# Patient Record
Sex: Male | Born: 1975 | Race: Black or African American | Hispanic: No | Marital: Single
Health system: Western US, Academic
[De-identification: ages and names within clinical notes are randomized; demographics above are authoritative.]

## PROBLEM LIST (undated history)

## (undated) ENCOUNTER — Inpatient Hospital Stay: Payer: MEDICARE | Attending: Student in an Organized Health Care Education/Training Program

## (undated) DIAGNOSIS — I10 Essential (primary) hypertension: Secondary | ICD-10-CM

## (undated) DIAGNOSIS — N289 Disorder of kidney and ureter, unspecified: Secondary | ICD-10-CM

## (undated) DIAGNOSIS — K219 Gastro-esophageal reflux disease without esophagitis: Secondary | ICD-10-CM

## (undated) MED ORDER — TACROLIMUS 1 MG CAPSULE, IMMEDIATE-RELEASE: 1 | ORAL | Status: AC

---

## 2017-12-28 ENCOUNTER — Emergency Department (HOSPITAL_COMMUNITY): Payer: Self-pay

## 2017-12-28 ENCOUNTER — Emergency Department (HOSPITAL_COMMUNITY)
Admission: EM | Admit: 2017-12-28 | Discharge: 2017-12-28 | Disposition: A | Discharge status: Home / Self Care | Payer: Self-pay | Attending: Emergency Medicine | Admitting: Emergency Medicine

## 2017-12-28 ENCOUNTER — Encounter (HOSPITAL_COMMUNITY): Payer: Self-pay

## 2017-12-28 DIAGNOSIS — N189 Chronic kidney disease, unspecified: Secondary | ICD-10-CM | POA: Insufficient documentation

## 2017-12-28 DIAGNOSIS — Z79899 Other long term (current) drug therapy: Secondary | ICD-10-CM | POA: Insufficient documentation

## 2017-12-28 DIAGNOSIS — Z992 Dependence on renal dialysis: Secondary | ICD-10-CM | POA: Insufficient documentation

## 2017-12-28 DIAGNOSIS — I160 Hypertensive urgency: Secondary | ICD-10-CM | POA: Insufficient documentation

## 2017-12-28 HISTORY — DX: Essential (primary) hypertension: I10

## 2017-12-28 HISTORY — DX: Gastro-esophageal reflux disease without esophagitis: K21.9

## 2017-12-28 HISTORY — DX: Disorder of kidney and ureter, unspecified: N28.9

## 2017-12-28 LAB — COMPREHENSIVE METABOLIC PANEL
ALK PHOS: 63 U/L (ref 38–126)
ALT: 12 U/L (ref 0–44)
ANION GAP: 13 (ref 5–15)
AST: 16 U/L (ref 15–41)
Albumin: 4.4 g/dL (ref 3.5–5.0)
BILIRUBIN TOTAL: 1.6 mg/dL — AB (ref 0.3–1.2)
BUN: 20 mg/dL (ref 6–20)
CALCIUM: 10 mg/dL (ref 8.9–10.3)
CO2: 28 mmol/L (ref 22–32)
CREATININE: 9.46 mg/dL — AB (ref 0.61–1.24)
Chloride: 96 mmol/L — ABNORMAL LOW (ref 98–111)
GFR, EST AFRICAN AMERICAN: 7 mL/min — AB (ref >60)
GFR, EST NON AFRICAN AMERICAN: 6 mL/min — AB (ref >60)
Glucose, Bld: 87 mg/dL (ref 70–99)
Potassium: 3.2 mmol/L — ABNORMAL LOW (ref 3.5–5.1)
Sodium: 137 mmol/L (ref 135–145)
TOTAL PROTEIN: 8.1 g/dL (ref 6.5–8.1)

## 2017-12-28 LAB — CBC
HCT: 40.7 % (ref 39.0–52.0)
HEMOGLOBIN: 12.7 g/dL — AB (ref 13.0–17.0)
MCH: 26.8 pg (ref 26.0–34.0)
MCHC: 31.2 g/dL (ref 30.0–36.0)
MCV: 86 fL (ref 78.0–100.0)
PLATELETS: 190 10*3/uL (ref 150–400)
RBC: 4.73 MIL/uL (ref 4.22–5.81)
RDW: 12.6 % (ref 11.5–15.5)
WBC: 3.3 10*3/uL — AB (ref 4.0–10.5)

## 2017-12-28 LAB — LIPASE, BLOOD: Lipase: 66 U/L — ABNORMAL HIGH (ref 11–51)

## 2017-12-28 MED ORDER — CLONIDINE HCL 0.1 MG PO TABS
0.1000 mg | ORAL_TABLET | Freq: Once | ORAL | Status: AC
  Administered 2017-12-28: 0.1 mg via ORAL
  Filled 2017-12-28: qty 1

## 2017-12-28 MED ORDER — ONDANSETRON 4 MG PO TBDP
4.0000 mg | ORAL_TABLET | Freq: Once | ORAL | Status: AC | PRN
  Administered 2017-12-28: 4 mg via ORAL
  Filled 2017-12-28: qty 1

## 2017-12-28 NOTE — ED Provider Notes (Signed)
[facility] Provider Note   CSN: [id number] Arrival date & time: 12/28/17  1416     History   Chief Complaint Chief Complaint  Patient presents with  . Hypertension    HPI Andrew Mccullough is a 42 y.o. male.  HPI   Patient presents today with elevated blood pressure.  He [place] Tuesday Thursday [place], he is here in town visiting family and had made arrangements to receive dialysis at the [facility].  He notes he did miss his Thursday session while traveling.  Also, yesterday ate pizza and smoked a cigarette which he knows he is not supposed to do and thinks this raised his blood pressure.  He states he received a full session of dialysis today reporting removing 4.5L.  He says he did take his clonidine and minoxidil this morning.  He has a printed sheet of his home medicines from his previous dialysis center which includes [facility], but states he did not take this today.  Of note, he is blind but states he definitely did not make medicines.  Additionally, did have a headache prior to arrival but was given Tylenol and now no longer reports this.  Past Medical History:  Diagnosis Date  . GERD (gastroesophageal reflux disease)   . Hypertension   . Renal disorder     There are no active problems to display for this patient.   History reviewed. No pertinent surgical history.      Home Medications    Prior to Admission medications   Medication Sig Start Date End Date Taking? Authorizing Provider  cloNIDine (CATAPRES) 0.1 MG tablet Take 0.1 mg by mouth 2 (two) times daily.   Yes [provider]  gabapentin (NEURONTIN) 300 MG capsule Take 300 mg by mouth every morning.   Yes [provider]  minoxidil (LONITEN) 10 MG tablet Take 10 mg by mouth daily.   Yes [provider]  sucroferric oxyhydroxide (VELPHORO) 500 MG chewable tablet Chew 1,500 mg by mouth 3  (three) times daily with meals.   Yes [provider]    Family History No family history on file.  Social History Social History   Tobacco Use  . Smoking status: Not on file  Substance Use Topics  . Alcohol use: Not on file  . Drug use: Not on file     Allergies   Asa [aspirin]   Review of Systems Review of Systems  Constitutional: Negative for activity change and appetite change.  Respiratory: Negative for chest tightness.   Cardiovascular: Negative for chest pain.  Gastrointestinal: Positive for nausea.  Neurological: Positive for headaches.  All other systems reviewed and are negative.    Physical Exam Updated Vital Signs BP 140/75   Pulse (!) 54   Temp 97.9 F (36.6 C) (Oral)   Resp 14   SpO2 99%   Physical Exam  Constitutional: He is oriented to person, place, and time. He appears well-developed and well-nourished. No distress.  HENT:  Head: Normocephalic and atraumatic.  Nose: Nose normal.  Eyes: Conjunctivae and EOM are normal.  Neck: Normal range of motion.  Cardiovascular: Normal rate, regular rhythm and normal heart sounds.  No murmur heard. Pulmonary/Chest: Effort normal and breath sounds normal. He has no rales.  Abdominal: Soft. Bowel sounds are normal.  Musculoskeletal: Normal range of motion. He exhibits no edema.  Neurological: He is alert and oriented to person, place, and time. No cranial nerve deficit.  Skin: Skin is warm and dry. Capillary refill takes less than 2 seconds.  Psychiatric: He has a normal mood and affect.     ED Treatments / Results  Labs (all labs ordered are listed, but only abnormal results are displayed) Labs Reviewed  LIPASE, BLOOD - Abnormal; Notable for the following components:      Result Value   Lipase 66 (*)    All other components within normal limits  COMPREHENSIVE METABOLIC PANEL - Abnormal; Notable for the following components:   Potassium 3.2 (*)    Chloride 96 (*)    Creatinine, Ser 9.46  (*)    Total Bilirubin 1.6 (*)    GFR calc non Af Amer 6 (*)    GFR calc Af Amer 7 (*)    All other components within normal limits  CBC - Abnormal; Notable for the following components:   WBC 3.3 (*)    Hemoglobin 12.7 (*)    All other components within normal limits    EKG EKG Interpretation  Date/Time:  Saturday December 28 2017 14:28:24 EDT Ventricular Rate:  63 PR Interval:  192 QRS Duration: 90 QT Interval:  430 QTC Calculation: 440 R Axis:   6 Text Interpretation:  Normal sinus rhythm with sinus arrhythmia Septal infarct , age undetermined T wave abnormality Baseline wander Abnormal ekg Confirmed by Gerhard Munch [id number]) on 12/28/2017 4:51:49 PM   Radiology Dg Chest 1 View  Result Date: 12/28/2017 CLINICAL DATA:  Pt presents for hypertension after dialysis today. Pt had full treatment, was given clonidine for BP at dialysis. EXAM: CHEST  1 VIEW COMPARISON:  None. FINDINGS: The heart size is enlarged but also accentuated by the portable AP technique. There are no focal consolidations or pleural effusions. No pulmonary edema. A vascular stent graft is partially imaged in the region of the LEFT axilla. IMPRESSION: Cardiomegaly.  No evidence for acute pulmonary abnormality. Electronically Signed   By: Norva Pavlov M.D.   On: 12/28/2017 16:49    Procedures Procedures (including critical care time)  Medications Ordered in ED Medications  ondansetron (ZOFRAN-ODT) disintegrating tablet 4 mg (4 mg Oral Given 12/28/17 1455)  cloNIDine (CATAPRES) tablet 0.1 mg (0.1 mg Oral Given 12/28/17 1641)     Initial Impression / Assessment and Plan / ED Course  I have reviewed the triage vital signs and the nursing notes.  Pertinent labs & imaging results that were available during my care of the patient were reviewed by me and considered in my medical decision making (see chart for details).     Patient with elevated blood pressure and headache prior to arrival, making him a  hypertensive urgency.  This may be related to salt intake indiscretion or missing his Thursday dialysis session.  Medically, appears relatively dry suggesting that he did get a full session prior to arrival.  Will get chest x-ray to assess fluid status.  Will dose additional 0.1 mg clonidine now and plan for discharge of chest x-ray is unremarkable and blood pressure continues to respond to treatment.  Chest x-ray without excess fluid, blood pressure responded nicely to additional dose of clonidine.  Will discharge patient home with continued plan to dialyze again on Tuesday at [facility], and recommended that he continue to watch his salt intake while he is here.  Final Clinical Impressions(s) / ED Diagnoses   Final diagnoses:  Hypertensive urgency    ED Discharge Orders    None        Garth Bigness, MD 12/28/17  1746    Gerhard Munch, MD 12/30/17 0010

## 2017-12-28 NOTE — ED Triage Notes (Addendum)
Pt presents for hypertension after dialysis today. Pt had full treatment, was given clonidine for BP at dialysis. Pt reports he feels nauseous and weak and has headache.

## 2017-12-28 NOTE — ED Notes (Signed)
ED Provider at bedside. 

## 2017-12-28 NOTE — Discharge Instructions (Addendum)
Your blood pressure responded well to an extra dose of your clonidine medicine.  It was likely elevated due to the extra salt that you ate yesterday.  Please return if you have any concerns with your blood pressure or feeling unwell or headache returning.  Follow-up with your plan to get dialysis [place] [facility].  Take your blood pressure medicines as normal tomorrow.

## 2018-03-03 ENCOUNTER — Encounter: Payer: Self-pay | Admitting: Nurse Practitioner

## 2018-03-03 NOTE — Progress Notes (Signed)
Received New referral from Dr Sharrell KuSu Steve W office on 03/03/2018, Insurance  Medicare and [employer].  Uploaded referral   Electronically entered by: Chanda BusingPartida, Gloria on 03/03/2018 2:39:00 PM

## 2018-03-17 NOTE — Progress Notes (Signed)
1st call : called patient at [phone] spoke to patient, completed  intake and scheduled for 07/15/2018 @ 1130am.  Electronically entered by: Chanda Busing on 03/17/2018 2:23:00 PM

## 2018-06-19 NOTE — Progress Notes (Signed)
Mailed out Patient Packet for apt on 07/15/2018 @ 11:30am in [facility]  Electronically entered by: Chanda Busing on 06/19/2018 11:12:00 AM

## 2018-07-03 NOTE — Progress Notes (Signed)
***   Outreach Eval Records:     Current Lab Results - uploaded   Nephrology Notes -uploaded   History & Physical -uploaded   Medication List -uploaded   PPD - TB Skin Test - n/a   Radiology (Chest x-ray and Abdominal ultrasound, etc.) -uploaded   Cardiac Stress Test (Echo, Stress test/myocardial perfusion, Cardiac  Catheterization and/or EKG) -uploaded  Consult notes (Cardiology, Oncology, etc.) -uploaded     Pathology Reports - n/a   Listing GFR - uploaded   2728 Form w/ Dialysis Start Date -uploaded   Social work assessments (report) -uploaded   Nutritional consultations (dietician report) -uploaded   Rounding Report - uploaded   Snapshot -  n/a   Mammogram - n/a   Pap Smear - n/a   CT/Ab Pelvis n/a     Other:       Electronically entered by: Chanda Busing on 07/03/2018 10:40:00 AM

## 2018-07-08 NOTE — Progress Notes (Signed)
RE: [facility] listed - cancel appt  Perciana RN from [facility] # [phone] returned my call, patient is not  Listed with [facility] since 07/2017 due to BMI and non compliance. Patient  would need to be re-referred to be evaluated once BMI at 35.  Patient was listed back in 2011 and removed in 2019.   Electronically entered by: Chanda Busing on 07/08/2018 2:21:00 PM

## 2018-07-08 NOTE — Progress Notes (Signed)
Call to patient on ??wait listed elsewhere??  Called patient due to notes in [facility] that he has been referred to [facility] and  [facility].  He states he has been with [facility] for many years but last year  was told he was ??off list?? due to missed testing.  Notified him that [facility] and [facility] share region and no benefit of being  listed on both lists.  He will call [facility] then get back to this writer  on weather he will keep 07/15/2018 appt. ky  Electronically entered by: Wendy Poet on 07/08/2018 11:20:00 AM

## 2018-07-08 NOTE — Progress Notes (Signed)
RE: Call to patient on ??wait listed elsewhere??  Patient was removed from [facility] schedule for 07/15/2018   Electronically entered by: Chanda Busing on 07/08/2018 1:02:00 PM

## 2018-07-08 NOTE — Progress Notes (Signed)
[facility] listed - cancel appt  Patient is inactive on [facility]'s wait list due to BMI - he states he is  losing weight but taking time due to depression.  He would like to stay  with [facility] and cancel [facility] appt for 07/15/2018. ky  Electronically entered by: Wendy Poet on 07/08/2018 11:39:00 AM

## 2018-07-14 NOTE — Progress Notes (Signed)
RE: RE: [facility] listed - cancel appt  patient called writer back, he is going to speak to his son and decide what  is the best option. Patient will then give writer a call back at  [phone] Ext [phone].  Electronically entered by: Chanda Busing on 07/14/2018 9:46:00 AM

## 2018-08-07 NOTE — Progress Notes (Signed)
RE: RE: RE: [facility] listed - cancel appt  Patient called back after seeing Dr Sharrell Ku. on 07/31/2018, per patient  Dr Janeece Riggers advise patient to just start fresh with [place]. Reviewed Intake and  scheduled patient for Evaluation in [facility] 10/14/2018 @  2:00pm   Electronically entered by: Chanda Busing on 08/07/2018 11:50:00 AM

## 2018-08-27 ENCOUNTER — Telehealth: Payer: Self-pay | Admitting: Internal Medicine

## 2018-08-27 DIAGNOSIS — N186 End stage renal disease: Principal | ICD-10-CM

## 2018-08-27 NOTE — Telephone Encounter (Signed)
Kidney transplant referral received and screened in for education class by this transplant coordinator.  Screening for the following criteria was completed based upon information sent in referral:     Age less than 43 years old   BMI less than 40   No active cigarette smoking   No physical deconditioning requiring the use of a wheelchair, walker or scooter   No advanced lung disease requiring home oxygen use   No non-compliance with dialysis within the last 6 months   No non-healing foot ulcer    Patient will be called to schedule into next available education class, prior to evaluation per CMS regulations.    Luella Cook BSN RN  Transplant Coordinator

## 2018-08-28 ENCOUNTER — Encounter: Payer: Self-pay | Admitting: Internal Medicine

## 2018-08-28 NOTE — Telephone Encounter (Signed)
During my Conversation with patient, I provided the following information:     1. They must bring a support person to the education appointment.  2. They must provide 48 hours-notice if canceling their appointment. If they do not call to reschedule within 14 days, the referral will be closed.  3. If they miss their appointment without calling to reschedule, their referral will be closed and  and their Dialysis Center and Nephrologist will be notified.   4. They will be eligible for re-referral 90 days after the missed appointment.  5. Packet was mailed out to correct address per patient.  6. Informed patient of possible appointment cancellation if Insurance is not contracted with [place].

## 2018-09-16 NOTE — Progress Notes (Signed)
Mailed out to patient  Packet Enclosed:   Appointment information  SRTR Data  Consent Forms  Electronically entered by: Chanda Busing on 09/16/2018 11:29:00 AM

## 2018-09-26 ENCOUNTER — Ambulatory Visit: Payer: Medicare Other

## 2018-09-26 NOTE — Progress Notes (Signed)
Telehealth Screening Call   Called patient at [phone]  [facility] to call writer back at [phone]  Ext [phone] regarding cancelled appointment.      Electronically entered by: Chanda Busing on 09/26/2018 8:46:00 AM

## 2018-10-07 NOTE — Progress Notes (Signed)
RE:   Uploaded new records, labs Nephrology PN, medication list   Electronically entered by: Chanda Busing on 10/07/2018 2:53:00 PM

## 2018-10-07 NOTE — Progress Notes (Signed)
RE: Telehealth Screening Call   Called patient at [phone]  [facility] to call writer back at [phone]   Electronically entered by: Chanda Busing on 10/07/2018 4:45:00 PM

## 2018-11-18 NOTE — Progress Notes (Addendum)
Called patient at [phone] spoke to patient, reminder him apt date and  time, all documents need to be turned in no later than Monday December 01, 2018, per patient will drop off today at [facility].    Appended Date: 11/28/2018 09:40 AM        By: Chanda Busing  ~~~~~~~~~~~~~~~~~~~~~~~~~  ~~~~~~~~~~~~~~~~~~~~~~~~~  ~~~~~~~~~~    Patient Called writer, changed contact # to [phone] and disconnected  [phone] all other remain the same, added email address and scheduled  Telehealth appointment.    12/02/2018 @ 12pm with Dr Genene Churn   12/09/2018 @ 1pm with Wendy Poet RN   Valda Lamb. for [facility]     Patient has packet mailed from previous appointment and aware needs to  complete and drop off at [facility] prior to 11/28/18.  New appointment Letter created and emailed to patient       Electronically entered by: Chanda Busing on 11/18/2018 3:32:00 PM

## 2018-11-20 MED ORDER — CLONIDINE ORAL
ORAL | Status: DC | PRN
Start: 2018-11-20 — End: 2019-05-26

## 2018-11-20 MED ORDER — BECLOMETHASONE DIPROP 40 MCG/ACTUATION HFA BREATH ACTIVATED AEROSOL
40 | RESPIRATORY_TRACT | Status: DC
Start: 2018-11-20 — End: 2019-04-14

## 2018-11-20 MED ORDER — SUCROFERRIC OXYHYDROXIDE 500 MG CHEWABLE TABLET
500 | ORAL | Status: AC
Start: 2018-11-20 — End: ?

## 2018-11-20 MED ORDER — MIDODRINE 5 MG TABLET
5 | ORAL | Status: DC
Start: 2018-11-20 — End: 2019-05-20

## 2018-11-20 MED ORDER — OXYCODONE-ACETAMINOPHEN 10 MG-325 MG TABLET
10-325 | ORAL | Status: AC | PRN
Start: 2018-11-20 — End: ?

## 2018-11-20 MED ORDER — ALBUTEROL SULFATE HFA 90 MCG/ACTUATION AEROSOL INHALER
90 | RESPIRATORY_TRACT | Status: AC | PRN
Start: 2018-11-20 — End: ?

## 2018-11-20 MED ORDER — MINOXIDIL 10 MG TABLET
10 | ORAL | Status: DC | PRN
Start: 2018-11-20 — End: 2019-05-26

## 2018-11-20 NOTE — Progress Notes (Unsigned)
Medical History     Diagnosis Date Comment Source   Asthma  Inhalers Provider   Back pain   Provider   Blood transfusion without reported diagnosis   Provider   BMI 35.0-35.9,adult  Height 6 foot even - weight 260 pounds for BMI 36.  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  Need to clarify FSGS by biopsy. Noted in evaluation with [facility] but no further documentation of FSGS by Nephrology.  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 Provider   MRSA (methicillin resistant Staphylococcus aureus)   Provider   Sleep apnea   Provider   Thromboembolism [facility])  Dialysis graft only Provider   Surgical History     Procedure Date Comment Source   AV FISTULA PLACEMENT 2016     BACK SURGERY      Bilateral Eye Surgery      CENTRAL VENOUS CATHETER 2015 Place and removed once fistula working    COLONOSCOPY 03/2018 Negative for polyps - no specimens taken    DIALYSIS FISTULA CREATION 2010 Revision 2014    TOOTH EXTRACTION 2018     Wound Vac 2015 Dehiscence anterior torso - chest    Family Medical History as of 11/20/2018     Problem Relation Age of Onset Comments   Diabetes Father     Diabetes Mother     Heart disease Father     Heart disease Mother     Hypertension Father     Hypertension Mother

## 2018-11-20 NOTE — Progress Notes (Signed)
Need Kidney biopsy  Need Kidney biopsy if available.  Per [facility] patient ESRD is FSGS. Tasked  to GP to obtain.  Electronically entered by: Wendy Poet on 11/20/2018 5:16:00 PM

## 2018-11-20 NOTE — Progress Notes (Signed)
Per medical records   43 yo male pt of Dr. Janeece Riggers.  ESRD due to HTN / FSGS on hemo at [facility]  ([facility]) with FDOD:  09/10/2008.  Writer to discuss IRD and Living donation.  Patient removed from [facility]'s waiting list due to BMI    Medical History   Asthma  Inhalers   Back pain     Blood transfusion without reported diagnosis    BMI 35.0-35.9,adult  Height 6 foot even - weight 260 pounds for BMI 36. ?   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 ? ?Need to clarify FSGS by  biopsy. ?Noted in evaluation with [facility] but no further documentation of  FSGS by Nephrology.  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  MRSA (methicillin resistant Staphylococcus aureus)     Sleep apnea     Thromboembolism (HCC)  Dialysis graft only      Surgical History   AV FISTULA PLACEMENT 2016    BACK SURGERY     Bilateral Eye Surgery     CENTRAL VENOUS CATHETER 2015 Place and removed once fistula working   COLONOSCOPY 03/2018 Negative for polyps - no specimens taken   DIALYSIS FISTULA CREATION 2010 Revision 2014   TOOTH EXTRACTION 2018    Wound Vac 2015 Dehiscence anterior torso - chest       Family Medical History as of 11/20/2018   Diabetes Father    Diabetes Mother    Heart disease Father    Heart disease Mother    Hypertension Father    Hypertension Mother        Electronically entered by: Wendy Poet on 11/20/2018 5:02:00 PM

## 2018-11-28 NOTE — Progress Notes (Signed)
RE: Need Kidney biopsy  pathology Kidney from 10/01/2007 uploaded   Electronically entered by: Seth Bake on 11/28/2018 9:34:00 AM

## 2018-12-01 NOTE — Progress Notes (Signed)
[place]   Patient came into [facility] office to drop off [employer]HQ and HIPPA forms, also  confirmed son received email for appointment and set to go. If any further  help needed patient is aware to contact writer at [phone] Ext  [phone] ask for [employer].   Electronically entered by: Seth Bake on 12/01/2018 2:32:00 PM

## 2018-12-02 ENCOUNTER — Ambulatory Visit: Admit: 2018-12-02 | Discharge: 2018-12-02 | Payer: MEDICARE | Attending: Nephrology

## 2018-12-02 DIAGNOSIS — I120 Hypertensive chronic kidney disease with stage 5 chronic kidney disease or end stage renal disease: Secondary | ICD-10-CM

## 2018-12-02 DIAGNOSIS — I10 Essential (primary) hypertension: Secondary | ICD-10-CM

## 2018-12-02 DIAGNOSIS — H548 Legal blindness, as defined in USA: Secondary | ICD-10-CM

## 2018-12-02 DIAGNOSIS — N051 Unspecified nephritic syndrome with focal and segmental glomerular lesions: Secondary | ICD-10-CM

## 2018-12-02 DIAGNOSIS — N186 End stage renal disease: Secondary | ICD-10-CM

## 2018-12-02 DIAGNOSIS — Z01818 Encounter for other preprocedural examination: Secondary | ICD-10-CM
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
## 2018-12-09 NOTE — Progress Notes (Signed)
Outreach Eval note  Per MD ok to list.  Patient has FDOD 2010 and will be top of list once  listed.    2 patient identifiers: Soc Sec# and DOB  Per patient ok to phone and leave detailed transplant message on their  answering machine.  Watched [place] Video about Kidney transplant  OK to receive blood and blood products    AMBULATORY SERVICES PATIENT EDUCATION NOTES  Patient/Family Readiness to Learn:  Accurately explains reason for visit and accurately relates medical history  Accurately describes the likely alteration in self-care routines/abilities  resulting from treatment  Able to provide names and reasons for taking current medications and  dosages  Able to follow proposed treatment plan for diet, activity and/or medication  without difficulty  LEARNING NEEDS:  None identified  BARRIERS:  None identified.Marland Kitchen   REFERRAL TO:  No Referral Required    INSTRUCTIONS PROVIDED:    Blood Kit #1 and #2 provided to the pt with instructions of how to complete  the kit. Reiterated to the patient that they would not be listed until  completion of both kits.  Consent obtained for HIV testing.    EDUCATION PAMPHLETS GIVEN AND EXPLAINED:    PHS Increase Risk Donor Consent Form for Recipients: consent form provided,  pt instructed if interested in enrolling into the program to sign the  informed consent form and return to [place].    Living Donor Website ? provided to the pt  Patient/Family Response to Teaching:    Verbalizes understanding of information/instructions given    On site selection completed (see presentation in [place]). Pt meets criteria  for:  Kidney transplant listing based on the diagnosis of CKD stage IV/V ESRD   Ok to list for transplant     Electronically entered by: Laurel Dimmer on 12/09/2018 1:35:00 PM

## 2018-12-09 NOTE — Progress Notes (Signed)
Blood Draw Kits/ consent forms  Tasked to GP to send Pre Eval Blood Draw Kits #1 and #2, IRD and Living  donor card  Electronically entered by: Laurel Dimmer on 12/09/2018 1:30:00 PM

## 2018-12-09 NOTE — Progress Notes (Signed)
Outreach Eval Note  Due to failed video connection, this planned video visit was conducted by  telephone only. I obtained consent from the patient to conduct this visit  by telephone only.  Electronically entered by: Laurel Dimmer on 12/09/2018 1:31:00 PM

## 2018-12-09 NOTE — Progress Notes (Signed)
Need Cardiology records  To GP to obtain cardiology records from Dr. Humberto Seals / [facility] [phone]. Need most current Progress note.  EKG, Echocardiogram, and/or Stress Test if available.  Electronically entered by: Laurel Dimmer on 12/09/2018 2:38:00 PM

## 2018-12-10 NOTE — Progress Notes (Signed)
RE: Blood Draw Kits/ consent forms  sent by [employer] tracking # [id number] ticket # [id number]   Electronically entered by: Seth Bake on 12/10/2018 2:59:00 PM

## 2018-12-15 ENCOUNTER — Encounter: Admit: 2018-12-15 | Discharge: 2018-12-16 | Payer: MEDICARE | Attending: Nephrology

## 2018-12-15 DIAGNOSIS — N186 End stage renal disease: Secondary | ICD-10-CM
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
## 2018-12-15 NOTE — Progress Notes (Signed)
psychosocial  SW evaluated patient by phone on 12/11/2018.  See [facility] for full  psychosocial.    The patient's sons also participated in the evaluation.  No potential donors identified at this time.  The patient was evaluated  by [facility] in the past, but his BMI was too high at that time.  He has  been exercising a lot and focusing on weight loss and his health.  CAREGIVER/SOCIAL SUPPORT:  The patient reported that his mother, Enid Derry at  [phone] or his son, Tonny Branch at [phone] would be his primary  support following the transplant able to provide assistance and  transportation.   CLINICAL IMPRESSIONS: No serious risk factors were identified other than  the patient's limited eye sight. Protective factors include patient's  family support, no apparent mental health, or substance use  issues, and an adequate level of insight and thoughtfulness around their  care.  PSYCHOSOCIAL RECOMMENDATIONS/PLAN:  No remarkable psychosocial issues were  identified that may impact the transplant outcome (low risk) and this  candidate is recommended for transplantation from a psychosocial  perspective.    Electronically entered by: Cheron Every on 12/15/2018 8:04:00 AM

## 2018-12-16 LAB — ABORH (OUTSIDE ORGANIZATION): ABORH Blood Type: AB NEG — NL

## 2018-12-17 ENCOUNTER — Encounter: Admit: 2018-12-17 | Discharge: 2018-12-18 | Payer: MEDICARE | Attending: Nephrology

## 2018-12-17 DIAGNOSIS — Z992 Dependence on renal dialysis: Secondary | ICD-10-CM

## 2018-12-17 DIAGNOSIS — N186 End stage renal disease: Secondary | ICD-10-CM

## 2018-12-17 LAB — ABO/RH: ABO/RH(D): AB NEG

## 2018-12-18 NOTE — Progress Notes (Signed)
RE: Need Cardiology records  Received PN from 10/22/2018 Dr Humberto Seals office and uploaded, called  [phone] , 3,3 LDVM to fax EKG, ECHO , Stress Test within 12 months to  [phone] call if any questions at [phone] ask for [place].    Electronically entered by: Seth Bake on 12/18/2018 11:03:00 AM

## 2018-12-23 LAB — SERUM PREPARATION & STORAGE (B

## 2018-12-23 LAB — HLA CLASS II TYPING - INTERMED

## 2018-12-23 LAB — HIV ANTIBODY AND ANTIGEN COMBI: HIV Ag/Ab Combo: NEGATIVE

## 2018-12-23 LAB — HEPATITIS B SURFACE ANTIBODY,: Hep B surf Ab, Quant: >800 m[IU]/mL

## 2018-12-23 LAB — HEPATITIS C ANTIBODY: Hep C Ab, Qual: NEGATIVE

## 2018-12-23 LAB — HLA CLASS I TYPING - INTERMEDI

## 2018-12-23 LAB — HLA AB - CLASS I SINGLE ANTIGE

## 2018-12-23 LAB — HEPATITIS B CORE ANTIBODY, TOT: Hep B Core Ab Total: NEGATIVE

## 2018-12-23 LAB — HEPATITIS B SURFACE ANTIGEN: Hep B surf Ag: NEGATIVE

## 2018-12-23 LAB — ALBUMIN, SERUM / PLASMA: Albumin, Serum / Plasma: 4.6 g/dL (ref 3.5–5.0)

## 2018-12-23 LAB — SERUM PREPARATION & STORAGE

## 2018-12-23 LAB — HLA ANTIBODY - CLASS II SINGLE

## 2018-12-23 LAB — ABORH (OUTSIDE ORGANIZATION): ABORH Blood Type: AB NEG — NL

## 2018-12-24 LAB — EPSTEIN-BARR VIRUS IGG AND IGM
Epstein-Barr Virus IgG Anti-EB: POSITIVE — AB
Epstein-Barr Virus IgM Anti-VC: NEGATIVE

## 2018-12-24 LAB — ABO/RH: ABO/RH(D): AB NEG

## 2018-12-24 LAB — TOXOPLASMA GONDII ANTIBODY, IG: Toxoplasma gondii Antibody, Ig: NEGATIVE

## 2018-12-24 LAB — SYPHILIS SCREEN BY RPR WITH RE: RPR: NONREACTIVE

## 2018-12-24 LAB — CYTOMEGALOVIRUS ANTIBODY, IGG: Cytomegalovirus antibody, IgG: POSITIVE — AB

## 2018-12-25 LAB — HLA CLASS II TYPING - INTERMED
DPA1-1: 1
DPA1-2: 2
DQA1-1: 1
DQA1-2: 3
DQB1-1: 3
DQB1-2: 5
DRB1-1: 13
DRB1-2: 15
DRB3-1: 2
DRB5-2: 1

## 2018-12-25 LAB — HLA CLASS I TYPING - INTERMEDI
A*-1: 66
A*-2: 68
B*-1: 15
B*-2: 58
Bw*-1: 6
Bw*-2: 4
C*-1: 5
C*-2: 6

## 2018-12-25 LAB — HLA TYPING REPORT

## 2018-12-26 LAB — HLA ANTIBODY - CLASS I SINGLE

## 2018-12-26 LAB — HLA ANTIBODY - CLASS II SINGLE

## 2018-12-26 LAB — HLA ANTIBODY REPORT

## 2018-12-30 NOTE — Progress Notes (Signed)
RE: LISTED  Pt has been added to the waitlist as ??Inactive??  Electronically entered by: Vidal Schwalbe on 12/30/2018 8:23:00 AM

## 2018-12-30 NOTE — Progress Notes (Signed)
UNOS Listing      Appended Date: 12/30/2018 08:06 AM        By: Laurel Dimmer  ~~~~~~~~~~~~~~~~~~~~~~~~~  ~~~~~~~~~~~~~~~~~~~~~~~~~  ~~~~~~~~~~    to data center for listing INACTIVE code 3 Candidate work up incomplete  Electronically entered by: Laurel Dimmer on 12/30/2018 8:06:00 AM

## 2019-01-01 NOTE — Progress Notes (Signed)
POC letter / FDOD: 2010 ABO AB Neg  POC letter in communication module dated 01/01/2019 tasked to GP to  distribute to pt/md/dxu.    ABO AB Neg, 74mprob/1%, cPRA 10%, Points 10.  Notified [facility] by email patient  has points 10 - will need work up now.  Inactive status.  Electronically entered by: Laurel Dimmer on 01/01/2019 11:52:00 AM

## 2019-01-01 NOTE — Progress Notes (Signed)
RE: RE: Need Cardiology records  Called [facility] [phone] spoke to medical records who indicated  patient has had EKG and Echo completed and will fax to [phone], No  Echo completed within 12 months  Electronically entered by: Seth Bake on 01/01/2019 10:01:00 AM

## 2019-01-02 NOTE — Progress Notes (Signed)
RE: POC letter / FDOD: 2010 ABO AB Neg  Letter printed, mailed to patient, faxed to unit and sent to referring MD   Electronically entered by: Seth Bake on 01/02/2019 10:07:00 AM

## 2019-01-06 NOTE — Progress Notes (Signed)
Pateint with phone issues  Received call from patient who indicated having phone issues.  If [place] is  trying to reach him please contact sons phone at [phone]  Electronically entered by: Laurel Dimmer on 01/06/2019 4:45:00 PM

## 2019-01-09 NOTE — Progress Notes (Signed)
Pt returned this writer's phone call. Reviewed time on waiting  list=10years, blood type AB, would like to expedite work up. Reviewed  required testing and the need for onsite Re-See visit in [place] once all  testing is completed. Drafted recipient work up [place]ar[place] manager. Reviewed process  with pt, had the opportunity to have all questions asked and answered,  verbalized understanding of all information provided.  Electronically entered by: Silas Sacramento on 01/09/2019 1:45:00 PM

## 2019-01-09 NOTE — Progress Notes (Signed)
Left message with pts son to return this writer's phone call. Name and  contact information given.  Electronically entered by: Silas Sacramento on 01/09/2019 11:56:00 AM

## 2019-01-12 NOTE — Progress Notes (Signed)
Faxed work up letter to Dr. Kasandra Knudsen, [facility], and sent to  patient's home address.  Electronically entered by: Cindi Carbon on 01/12/2019 4:40:00 PM

## 2019-01-13 NOTE — Telephone Encounter (Signed)
Scheduled  EDU  for 02/17/2019    During the conversation with patient, [place] provided the following information:     1. They must bring a support person to the education appointment.  2. They must provide 48 hours-notice if canceling their appointment. If they do not call to reschedule within 14 days, the referral will be closed.  3. If they miss their appointment without calling to reschedule, their referral will be closed and  and their Dialysis Center and Nephrologist will be notified.   4. They will be eligible for re-referral 90 days after the missed appointment.  5. Informed patient of possible appointment cancellation if Insurance is not contracted with [facility].

## 2019-01-13 NOTE — Telephone Encounter (Signed)
Updated DU resent 14 day letter

## 2019-01-13 NOTE — Progress Notes (Signed)
RE: RE: RE: Need Cardiology records  uploaded Myocardial Perfusion and EKG 11/20/2018   Electronically entered by: Seth Bake on 01/13/2019 9:34:00 AM

## 2019-01-14 NOTE — Telephone Encounter (Signed)
During my Conversation with patient, I provided the following information:     1. They must bring a support person to the education appointment.  2. They must provide 48 hours-notice if canceling their appointment. If they do not call to reschedule within 14 days, the referral will be closed.  3. If they miss their appointment without calling to reschedule, their referral will be closed and  and their Dialysis Center and Nephrologist will be notified.   4. They will be eligible for re-referral 90 days after the missed appointment.  5. Packet was mailed out to correct address per patient.  6. Informed patient of possible appointment cancellation if Insurance is not contracted with [facility].

## 2019-02-17 ENCOUNTER — Ambulatory Visit: Payer: Medicare Other

## 2019-02-19 NOTE — Progress Notes (Signed)
15 day follow up on work up letter    Uploaded PPD 05/28/18 to [facility].   Patient completed ekg and myocardial perfusion 11/20/18, results already in  [facility].  Received message from patient requesting another copy of the letter.  Returned call to patient informing him letter will be sent to patient's  home address and faxed to dialysis unit with pending items  highlighted. Patient still requires CXR, CT abd/pelvis, and echo. Patient  is unsure if he did the ECHO but he completed the stress test and 24 holter  monitor. Notified Amada Kingfisher.  Electronically entered by: Cindi Carbon on 02/19/2019 10:29:00 AM

## 2019-03-03 NOTE — Progress Notes (Signed)
PC from pt: States had MPS done last month at cardiologist office, CT and  CXR scheduled for 03/12/2019 at [facility]. Awaiting  date for TTE. Pt states will keep this Probation officer update as tests  are completed.  Electronically entered by: Silas Sacramento on 03/03/2019 5:04:00 PM

## 2019-03-12 ENCOUNTER — Ambulatory Visit: Admit: 2019-03-12 | Discharge: 2019-03-13

## 2019-03-12 NOTE — Addendum Note (Signed)
Addended by: Genevie Ann on: 03/12/2019 01:27 PM     Modules accepted: Orders

## 2019-03-12 NOTE — Telephone Encounter (Signed)
Referral closed - pt no show EDU on 02/17/2019

## 2019-03-13 NOTE — Progress Notes (Signed)
Received answering service message from patient regarding completing  studies. Returned call and patient completed CXR and CT abd/pelvis 03/12/19  at [facility] [place], echo was done at Dr. Lovie Chol  office. Will request reports.   Electronically entered by: Cindi Carbon on 03/13/2019 11:28:00 AM

## 2019-03-18 NOTE — Progress Notes (Signed)
Uploaded the following to [facility] from [facility]:    CT abd/pelvis 03/12/19  Images of CT abd/pelvis 03/12/19 now loaded in PACS.  CXR  03/12/19   ECHO 03/05/19  Holter monitor 10/22/18  Cardiology visit note 01/29/19  Patient completed testing.  Notified Amada Kingfisher.  Electronically entered by: Cindi Carbon on 03/18/2019 4:01:00 PM

## 2019-03-25 NOTE — Progress Notes (Signed)
Placed a call to patient today [phone] LVM to call the writer back for  resee appointment.  Electronically entered by: Marlana Salvage on 03/25/2019 12:42:00 PM

## 2019-04-01 NOTE — Progress Notes (Signed)
Placed a call to patient and spoke to patients son/caregiver Cody Myers  scheduled patient for a Kidney Cody Myers resee with Dr. [name] on October  27th, 2020 at 1:30 pm. Demographics were verified Full name, SS,  email address, contact number, address current Nephrologist, Emergency  contact, DXU, and DOB. Patient's son will call writer back to provide  insurance information. Writer informed patient to watch video prior  to appointment. Patient confirmed she has a Garment/textile technologist.  Electronically entered by: Marlana Salvage on 04/01/2019 11:21:00 AM

## 2019-04-03 NOTE — Progress Notes (Signed)
SW called patient's son/caregiver Daizah to schedule the patient's re-see  psychosocial evaluation telehealth appointment. Per chart patient's son  arranges appointments as patient is legally blind. Patient is  scheduled for his re-see appointment in clinic on 04/14/2019.     SW reached patient's son who reported they could participate in the  evaluation on Thursday 04/09/2019 at 2:00 pm. SW confirmed patient's son  contact information, scheduled the appointment, and sent patient's  son the link to participate.     Allyson Sabal, LCSW  Electronically entered by: Allyson Sabal on 04/03/2019 1:47:00 PM

## 2019-04-07 NOTE — Progress Notes (Addendum)
DXU NOTES and PPD uploaded    Appended Date: 04/09/2019 10:54 AM        By: Gerrianne Scale  ~~~~~~~~~~~~~~~~~~~~~~~~~  ~~~~~~~~~~~~~~~~~~~~~~~~~  ~~~~~~~~~~    04/14/19 resee packet sent to pateint  Letter faxed to MD/DXU  DXU notes requested  Electronically entered by: Gerrianne Scale on 04/07/2019 9:23:00 AM
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
## 2019-04-09 NOTE — Progress Notes (Signed)
SW evaluated patient via telephone (due to technical issues on his end) on  04/09/2019 in advance of his re-see transplant evaluation on 04/14/2019.  See [facility] for full psychosocial.    CAREGIVER/SOCIAL SUPPORT: The patient reported that his son/IHSS worker,  Vera Wishart [phone]), would be primary support following the  transplant able to provide assistance and transportation. SW spoke  with Selinda Eon who reported he already provides the main elements of patient's  car that will be required post-transplant. Daizah lives with the patient  and attends remote school. However, his school is aware of  his commitment to his father and Selinda Eon reported it has been  straightforward to get recordings of remote classes he has to miss if he  has to take his father to an appointment, and he denied concerns around  providing post-transplant care. The patient has several other family  members locally in [place] who can assist if needed.    CLINICAL IMPRESSIONS: Risk factors include patient's vision loss, mild  symptoms of depression, and relatively low functional status. Protective  factors include patient's family support, coping skills and  continuing to function through feeling down, no apparent substance use or  adherence issues, and motivation.    PSYCHOSOCIAL RECOMMENDATIONS/PLAN:     No remarkable psychosocial issues were identified that may impact the  transplant outcome (low risk) and this candidate is recommended for  transplantation from a psychosocial perspective.    1.) Patient to continue to rely on his son's support longterm  post-transplant to ensure adherence due to vision loss.  2.) Should patient's depressive symptoms increase and affect his  functioning, he will require mental health treatment.    Allyson Sabal, LCSW  Electronically entered by: Allyson Sabal on 04/09/2019 4:56:00 PM

## 2019-04-10 NOTE — Progress Notes (Signed)
Spoke with pt and confirmed that he has Medicare A,B and D and Mcal as ins  coverage and no other commercial or other ins coverage at this time. Went  over [place] thru ESRD benefits and COB. Pt understands the  importance of taking immunosuppressant drugs for life and maintaining good  ins coverage at all times. Pt states that after [place], he plans on going back  to work and obtain ins coverage thru employment. Also  advised pt to take the Rx list to his pharmacy so he can obtain a more  specific copay info per Rx. Pt is also aware of the weekly follow up care  after his tx for the first 8 weeks. Pt has his son to take him  to his apts. Pt has no other issues at this time. Pt is financially  cleared.  Electronically entered by: Elinor Dodge on 04/10/2019 4:13:00 PM

## 2019-04-10 NOTE — Progress Notes (Signed)
Emailed [place] letter and Rx list      From: Cody Myers   Sent: Friday, April 10, 2019 1:07 PM  To: [email or web address]  Subject: Secure: Cody Myers,L - [place] letter and Rx list    Hi Cody Myers,     Please find the attached insurance information regarding Kidney Cody Myers  and post-Cody Myers medications costs.   We have your current insurance as  Medicare A,B and D and Medi-cal Crossover.  Please review  the following attachments and contact me directly if you have any questions  regarding your insurance.  Your insurance does cover the Kidney Cody Myers  and post-Cody Myers medications.  Myers is always best to  verify your own coverage (copays, coinsurance, and annual out-of-pockets)  with your health plan since you are the subscriber of the plan.  Please  note that for each of your potential donors, all testing will  be billed to your own insurance, and you will be financial responsible for  all copays/coinsurance. Also, Myers is always important to make sure your  insurance coverage continues on after Cody Myers so that you  will have your post-Cody Myers care always covered.      Here is an informational link from [place] regarding COBRA and the rules:  [email or web address]  [email or web address]).aspx    ***If you are having any problems with registering or opening up the  attachment, please contact Cody Myers at [phone] Option 3 to assist you.  They are open 24 hours a day.***      TEFL teacher      Electronically entered by: Cody Myers on 04/10/2019 1:08:00 PM

## 2019-04-12 MED ORDER — PREDNISONE 5 MG TABLET
5 | ORAL | Status: DC
Start: 2019-04-12 — End: 2019-05-20

## 2019-04-12 MED ORDER — OMEPRAZOLE 40 MG CAPSULE,DELAYED RELEASE
40 | ORAL | Status: DC | PRN
Start: 2019-04-12 — End: 2019-05-21

## 2019-04-12 NOTE — Assessment & Plan Note (Signed)
Takes midodrine 10 mg on dialysis days only. No hypotension noted on dialysis flow sheet.

## 2019-04-14 ENCOUNTER — Ambulatory Visit: Admit: 2019-04-14 | Payer: MEDICARE | Attending: Physician | Primary: Physician

## 2019-04-14 DIAGNOSIS — H548 Legal blindness, as defined in USA: Secondary | ICD-10-CM

## 2019-04-14 DIAGNOSIS — Z01818 Encounter for other preprocedural examination: Secondary | ICD-10-CM

## 2019-04-14 DIAGNOSIS — I12 Hypertensive chronic kidney disease with stage 5 chronic kidney disease or end stage renal disease (CMS code): Secondary

## 2019-04-14 DIAGNOSIS — Z992 Dependence on renal dialysis: Secondary | ICD-10-CM

## 2019-04-14 DIAGNOSIS — N269 Renal sclerosis, unspecified: Secondary | ICD-10-CM

## 2019-04-14 DIAGNOSIS — N186 End stage renal disease: Secondary | ICD-10-CM

## 2019-04-14 DIAGNOSIS — N051 Unspecified nephritic syndrome with focal and segmental glomerular lesions: Secondary | ICD-10-CM

## 2019-04-14 DIAGNOSIS — I10 Essential (primary) hypertension: Secondary | ICD-10-CM

## 2019-04-14 NOTE — Progress Notes (Signed)
Medical History Summary to Date (as of 04/12/2019) MRN: [id number] SSN: [id number]   Here for Re-See appointment to clear for READY list  Name: Cody Myers  DOB: [birth year]  Age (Sex): 69M  Language: English  Ethnicity: White  Travel time: Lives in [place] - travel time to [employer] 5 hours.    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), Hypertensive Nephrosclerosis    Listing Date: 12/30/2018  Accumulated List Date: 09/10/2008  Blood Type: AB  UNOS Points: 10.6  cPRA: 10.581%  (as of 12/17/2018) 64mm (national): 1% (local): 0%  EPTS: 24%  Days since Last Serum: 116 SA Serum Past Due  Days since Last cPRA: 116    Staff Warnings:   Staff Alerts: Visually Impaired // Refuses high risk organs- Please verify this with patient  Special Programs:     FDOD: 09/10/2008  Years on Dialysis: 10  Dialysis Type: HD Hemodialysis  Schedule: M/W/F AM   Dialysis Center:  [facility]  Phone: [phone]  FAX: [phone]  Nephrologist: Drusilla Kanner, MD Phone: [phone]    Prior Transplants: 0  Notes for On-Call:     Sulfa Allergy: No known sulfa allergy  Medical Notes:   Surgical Notes: CT abd/pelvis 03/12/2019  read by AP: min very distal eia dz bil, but o/w no sig ca    Encounters: Resee (04/14/2019), VIDEO VISIT 45 (12/09/2018), VIDEO VISIT 89 (12/02/2018), Evaluation Nephrology (07/28/2008)  Latest Encounter: [facility]ee (04/14/2019) with Dr. [name]    Past Medical History:  Other Pertinent Med Hx: (include interval history since ReSee visit; include anticoagulation, on midodrine, hypotension - should not receive hypertensive kidney)  Past Surgical History: [brief summary]   Immunologic History:  (06/19/07) pRBC 2u       STUDIES (within last two years)  ------------  CT Ab/Pelvis Surgeon Comments:      CT ABDOMEN/PELVIS (03/12/2019)  CT reviewed by Dr. Adelene Amas: min very  distal eia dz bil, but o/w no sig ca  CT Abdomen and Pelvis WO Contrast 03/13/2019  [facility]  Result Impression    1.  Markedly atrophic kidneys with cystic replacement of the renal   parenchyma query polycystic kidney disease.    2.  Hepatosplenomegaly.     3.  No acute abdominal or pelvic pathology.      MYOCARDIAL PERFUSION SCAN (11/20/2018)  1. The pharmacological perfusion study is negative for ischemia.  2. There is not previous study available.  3. The global LV systolic function is normal.  4. Further evaluation with medical management is necessary at this time.  **Post-stress gated SPECT images demonstrated normal contractility with a calculated LVEF of 61%    ECHO TTE (03/05/2019)  1. Normal left ventricular size with moderate concentric left ventricular hypertrophy. EF 65%  2. grade 2 pseudo-normal diastolic LV dysfunction is present.  3. Overall left ventricular systolic function is grossly normal.  4. Mildly enlarged left atrium.  5. The aortic valve is structurally and functionally normal.  6. Mild thickened and/or calcified mitral leaflets.  7. There is trace mitral regurgitation.  8. Mild tricuspid insufficiency with normal estimated right heart pressure. RVSP 19.00 mmHg      EKG (12/23/2018)  Sinus rhythm  Consider inferior infarct    X-RAY CHEST (03/12/2019)    No acute cardiopulmonary process.  COLONOSCOPY (04/02/2018)  Melanosis in the colon.  No specimens collected.    HOLTER MONITOR (10/22/2018)  IMPRESSION:      This  24-hour  Holter  monitor  is  suggestive of  sinus  rhythm with  sinus tachycardia.      There were  PVCs and PACs. There were no  sustained  arrhythmias seen.    CT OTHER (04/02/2017)  1. No acute intracranial abnormality.  2.  Right maxillary sinus near complete opacification.  Mild left maxillary sinus mucosal thickening.   3. Mastoids are clear.     PPD (01/30/2019)  PPD negative 65mm induration    DXU NOTES (04/08/2019)  03/09/2019: Plts  262,000    Serologies: (12/17/18) CMV (POS), EBV (POS), RPR (Nonreactive), EBV IgM (NEG), HBcAb (NEG), HBsAg (NEG), HCV (NEG), HIV (NEG), Toxo IgG (NEG), HBsAb Quant (>800)    Functional Status: Limited due to visual impairment, ambulatory    Support: Son Lelan Pons will be primary caregiver     Clearances: SW (AW 04/09/19; Low Risk) FC (CI 04/10/19; Financially Cleared) RD (Not in Diag/Eval)    Selection: (12/09/2018) Presented by <<Presented_by>>; Accepted Committee: Elba Barman;  ESRD, FSGS, HTN; <<Presentation_remarks>>    Insurance: (Primary) Medicare Part A&B  (Secondary) Medicare/M-Cal Crossover     BIOPSY OTHER (01/29/2014)  FINAL DIAGNOSIS (Microscopic):  1.   Left chest, thrombus, thrombectomy -  A.   Blood clot.  B.   Portion of fibrous tissue with marked necroinflammatory  infiltrate.  C.   No evidence of malignancy.    BIOPSY LUNG (11/04/2013)  FINAL DIAGNOSIS (Gross Only):  1.   Pulmonary artery, left, removal of foreign body -  a.   Foreign body (clear plastic tube).    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

## 2019-04-14 NOTE — Assessment & Plan Note (Signed)
Diagnosed in 1990.    BP slightly high today but reports well controlled at home.  On clonidine PRN and minoxidile     Had some low BP after HD so currently using midodrine at times.

## 2019-04-14 NOTE — Progress Notes (Signed)
Name: Cody Myers  MRN: [id number]  Date of Birth: Sep 02, 1975  Date of Service: 04/14/2019      Interpreter Services? Not required.        Dear Dr. Kasandra Knudsen,       Thank you for referring patient Cody Myers to be evaluated for renal transplantation. He was re-seen in consultation on 04/14/19 at the [facility] on [place].        As you know, the patient is a 43 y.o. 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 The patient was seen today because he has been on the deceased donor waiting list since 12/30/18 with blood group AB and PRA 10.6%. he is continuing to seek kidney transplantation as a means of treatment of his CKD/ESRD.  Last seen by Dr. Grant Fontana in 12/02/2018.  Was turned down by [facility] 6 - 7 years ago but high BMI.    Been feeling well.  No recent hospitalizations or sickness (last hospitalization was beginning of the year for HTN).  Legally blind since 2009 but son helps out with daily activity.  Works out with his children almost daily (weight training and walks treadmills for 15 - 101mn).  No trouble breathing or chest pain with activity.  No issues with HD but been using midodrine at times due to hypotension after HD but not all the time.      I reviewed the patient's medical records and laboratory data provided as part of this evaluation process.     Past Medical History:  Past Medical History:   Diagnosis Date    Asthma     Inhalers    Back pain     Blood transfusion without reported diagnosis     BMI 35.0-35.9,adult     Height 6 foot even - weight 260 pounds for BMI 36.      Chronic kidney disease 2009    ESRD due FSGS / DM.    Need to clarify FSGS by biopsy.  Noted in evaluation with [facility] but no further documentation of FSGS by Nephrology.     Depression     not taking medication    GERD (gastroesophageal reflux disease)     Gout     Heart murmur     Hypercholesterolemia     Hypertension     2018 BP dropping  on dialysis - MD ordered Midodrine 5 mg for SBD <100    Legally blind 2010    Conjunctiva disorder with prolapse right eye.  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    MRSA (methicillin resistant Staphylococcus aureus)     Sleep apnea     Thromboembolism (CMS code)     Dialysis graft only       Past Surgical History:  Past Surgical History:   Procedure Laterality Date    AV FISTULA PLACEMENT Right 2016    BACK SURGERY      Bilateral Eye Surgery      CENTRAL VENOUS CATHETER  2015    Place and removed once fistula working    COLONOSCOPY  03/2018    Negative for polyps - no specimens taken    DIALYSIS FISTULA CREATION Left 2010    Revision 2014    TOOTH EXTRACTION  2018    Wound Vac  2015    Dehiscence anterior torso - chest       Family History:  Family History   Problem Relation Name Age  of Onset    Diabetes Mother      Heart disease Mother      Hypertension Mother      Diabetes Father      Heart disease Father      Hypertension Father         Social History:  Social History     Socioeconomic History    Marital status: Single     Spouse name: Not on file    Number of children: 4    Years of education: 12    Highest education level: Associate degree: occupational, Hotel manager, or vocational program   Occupational History    Occupation: Disabled Administrator   Tobacco Use    Smoking status: Former Smoker     Packs/day: 1.00     Years: 4.00     Pack years: 4.00     Types: Cigarettes     Last attempt to quit: 06/19/2007     Years since quitting: 11.8    Smokeless tobacco: Never Used   Substance and Sexual Activity    Alcohol use: Not Currently    Drug use: Not Currently    Sexual activity: Not Currently         Current Medications:  Current Outpatient Medications   Medication Sig Dispense Refill    albuterol 90 mcg/actuation metered dose inhaler Inhale 2 puffs into the lungs      beclomethasone (QVAR REDIHALER) 40 mcg/actuation breath actuated inhaler Inhale 80 mcg into the lungs 2 (two) times daily       clonidine HCl (CLONIDINE ORAL) Take 0.1 mg by mouth daily         midodrine (PROAMATINE) 5 mg tablet Take 10 mg by mouth 3 (three) times a week on Mondays, Wednesdays, and Fridays On dialysis days only.        MINOXIDIL ORAL Take 10 mg by mouth daily         omeprazole (PRILOSEC) 40 mg capsule Take 40 mg by mouth daily      oxyCODONE-acetaminophen (PERCOCET) 10-325 mg tablet Take 1 tablet by mouth every 4 (four) hours as needed for Pain      predniSONE (DELTASONE) 5 mg tablet Take 5 mg by mouth daily      sucroferric oxyhydroxide (VELPHORO ORAL) Take by mouth       No current facility-administered medications for this visit.        Allergies:  Allergies/Contraindications   Allergen Reactions    Aspirin      Other reaction(s): Other (Specify with Comments)  Bleeding ulcers  Has stomach bleeding      Lactose      Other reaction(s): Other (Specify with Comments)       Review of Systems:  Review of systems was performed and the medical history questionnaire was reviewed with the patient.     Review of Systems   Constitutional: Negative for chills and fever.   HENT: Negative for congestion and hearing loss.    Eyes: Positive for blurred vision.   Respiratory: Negative for cough and hemoptysis.    Cardiovascular: Negative for chest pain and leg swelling.   Gastrointestinal: Negative for heartburn, nausea and vomiting.   Genitourinary: Negative for dysuria.   Musculoskeletal: Negative for myalgias.   Skin: Negative for rash.   Neurological: Negative for dizziness, seizures and weakness.   Psychiatric/Behavioral: Negative for depression and suicidal ideas.       The remainder of the review of systems was negative.    Physical  Examination:  There were no vitals filed for this visit.    Physical Exam  Constitutional:       Appearance: Normal appearance.   HENT:      Head: Normocephalic and atraumatic.      Right Ear: External ear normal.      Left Ear: External ear normal.      Nose: Nose normal.       Mouth/Throat:      Mouth: Mucous membranes are moist.   Neck:      Musculoskeletal: Normal range of motion.   Cardiovascular:      Rate and Rhythm: Normal rate and regular rhythm.   Pulmonary:      Effort: Pulmonary effort is normal.      Breath sounds: Normal breath sounds.   Abdominal:      Palpations: Abdomen is soft.   Musculoskeletal: Normal range of motion.   Skin:     General: Skin is warm.   Neurological:      General: No focal deficit present.      Mental Status: He is alert.   Psychiatric:         Mood and Affect: Mood normal.         Behavior: Behavior normal.         Exercise Capacity:   Treadmills for 15-40mn without issues.      Other Studies:  I personally reviewed and interpreted outside medical records including clinic/ hospital progress notes and outside laboratory/ radiologic data including the results noted below:    BIOPSY KIDNEY (10/01/2007)  Focal Segmental Glomerulosclerosis  Patchy interstitial fibrosis and "thyroidzation type of tubular atrophy    CT ABDOMEN/PELVIS (03/12/2019)  1. Markedly atrophic kidneys with cystic replacement of the renal   parenchyma query polycystic kidney disease.  2. Hepatosplenomegaly.   3. No acute abdominal or pelvic pathology.     MYOCARDIAL PERFUSION SCAN (11/20/2018)  1. The pharmacological perfusion study is negative for ischemia.  2. There is not previous study available.  3. The global LV systolic function is normal.  4. Further evaluation with medical management is necessary at this time.  **Post-stress gated SPECT images demonstrated normal contractility with a calculated LVEF of 61%    ECHO TTE (03/05/2019)  1. Normal left ventricular size with moderate concentric left ventricular hypertrophy. EF 65%  2. grade 2 pseudo-normal diastolic LV dysfunction is present.  3. Overall left ventricular systolic function is grossly normal.  4. Mildly enlarged left atrium.  5. The aortic valve is structurally and functionally normal.  6. Mild thickened and/or  calcified mitral leaflets.  7. There is trace mitral regurgitation.  8. Mild tricuspid insufficiency with normal estimated right heart pressure. RVSP 19.00 mmHg    EKG (12/23/2018)  Sinus rhythm  Consider inferior infarct    X-RAY CHEST (03/12/2019)  No acute cardiopulmonary process.     COLONOSCOPY (04/02/2018)  Melanosis in the colon.  No specimens collected.    HOLTER MONITOR (10/22/2018)  This  24-hour  Holter  monitor  is  suggestive of  sinus  rhythm with  sinus tachycardia.      There were  PVCs and PACs. There were no  sustained  arrhythmias seen.    CT OTHER (04/02/2017)  1. No acute intracranial abnormality.  2.  Right maxillary sinus near complete opacification.  Mild left maxillary sinus mucosal thickening.   3. Mastoids are clear.     PPD (01/30/2019)  PPD negative 036minduration    DXU NOTES (  04/08/2019)  03/09/2019: Plts 262,000    Serologies: (12/17/18) CMV (POS), EBV (POS), RPR (Nonreactive), EBV IgM (NEG), HBcAb (NEG), HBsAg (NEG), HCV (NEG), HIV (NEG), Toxo IgG (NEG), HBsAb Quant (>800)       Impression/Plan:  In summary, Cody Myers is a 43 y.o.  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     Over the course of 2 hours, the patient attended an educational lecture about kidney transplantation and met the evaluation team, which consists of pre-transplant nurse coordinators and physicians. The patient will be meeting with the social worker and financial counselor via virtual visit. All practical aspects of renal transplantation were discussed at length with the patient, including the differences between living donor and deceased donor renal transplantation, issues associated with the deceased donor waiting list such as wait-times, multiple listing and parallel listing, as well as the alternatives to transplantation. I explained the in-hospital course after transplantation as well as the post-transplant follow-up and the  absolute requirement for lifelong administration of immunosuppressive medications. The risks of immunosuppression in general as well as specific side effects of commonly used immunosuppressive medications were also reviewed.         Special focus in our discussion revolved around staying healthy and keeping weight down (Currenlty BMI of 36.75 which is acceptable for transplant).       The following additional tests are required as part of the pretransplant work-up:    None    The case has been reviewed. Onsite selection has been completed and patient has been approved for transplantation.      Deceased donor considerations:   Since hypotension is not a concern, hypertensive deceased donor is not a concern.        The patient was informed that if his medical condition changes or he does not pass the additional medical/surgical testing protocol, transplantation will not be an option for the treatment of his kidney disease. I believe the patient has a good understanding of the risks and benefits associated with renal transplantation.     I spent a total of 60 minutes face-to-face with the patient and 45 minutes of that time was spent counseling regarding the risks and benefits of transplantation, national and center specific graft and patient outcomes, and the factors affecting success of transplantation.      Thank you for referring Mr. Iosefa Weintraub to our Transplant Service. We will keep you apprised of his progress. If you have any questions of problems, please do not hesitate to contact me.     Case discussed with Dr. Chauncy Passy.

## 2019-04-14 NOTE — Assessment & Plan Note (Signed)
Gets dialysis [facility] via [facility]

## 2019-04-14 NOTE — Assessment & Plan Note (Signed)
Cause of her ESRD along with HTN.

## 2019-04-14 NOTE — Assessment & Plan Note (Signed)
Seen patient for transplant evaluation.      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  Been on dialysis since 2010.    Appears to be good candidate medically.

## 2019-04-14 NOTE — Assessment & Plan Note (Signed)
Son helps with daily activity

## 2019-04-15 NOTE — Progress Notes (Signed)
Pt arrived for kidney tx re-see and watched Keilah Lemire Education Video on  04/14/19. Was seen by Dr. [name]. Pt received SRTR (Version Spring 2020).    Electronically entered by: Gerrianne Scale on 04/15/2019 7:34:00 AM

## 2019-04-21 NOTE — Progress Notes (Signed)
[facility] NUTRITION:  Pre Kidney Transplant        [facility]         Nutrition Assessment:  Cody Myers is a 43 y.o. yo male present in Re-See Clinic pre-renal transplant for initial medical nutrition therapy (MNT) for post kidney transplant recovery, long term kidney health and lifelong disease prevention.    Nutrition-related Medical Hx:  ESRD, FSGS, HTN, HLD    Nutrition-related Medication:  anti-HTN, anti-lipid, phosphate binder    Anthropometrics:    Ht Readings from Last 1 Encounters:   04/21/19 179.1 cm (5' 10.5")   :    Wt Readings from Last 1 Encounters:   04/21/19 (!) 117.5 kg (259 lb 2.1 oz)     139 % IBW (169# +/-10%)  Body mass index is 36.66 kg/m.   Class II obesity      Biochemical Data, Medical Tests and Procedures:     No current lab values available.       Current Diet:  Renal for dialysis  Appetite:  good  Diet History:  B- egg: L- tuna salad or leftover; D- protein or soup, vegetables cooked with no salt by son; sn- granola bar  popsickle.  Drinks water  Assessment:  low - moderatesodium ( some processed); moderate -excess total/saturated fat (animal products,added); moderate-excess total/refined carbohydrates (starches);  meeting protein needs; inadequate fiber/plant foods  Energy:  good  Activity:  walk / day recently initiated with son        Nutrition Diagnosis:  Altered nutrition-related laboratory value related to HTN as evidenced by ESRD.      Nutrition Intervention:  Nutrition Education:    [x]   Pre renal transplant MNT recommended for:        1.   ESRD with dialysis RD        2.   Weight Management - regular walking, less starches, fats    [x]   Post renal transplant nutrition briefly reviewed with written information provided to include:         1.  Early post transplant MNT for healthy recovery and reduced infection risk.         2.  Long-term MNT for healthy kidney maintenance and prevention of disease risks of heart disease, obesity, diabetes.         3.   Healthy Plate method and Good Nutrition for Post Kidney Transplant.     4.  Regular exercise: Goal is 150 minutes each week or more, as tolerated.     [x]   Nutrition information on post renal transplant was mailed/emailed to patient       [x]   Patient will be seen by [facility] inpatient RD for continued MNT for post renal transplant.        Nutrition Evaluation & Monitoring:  [x]   Utilize dialysis RD for MNT pre transplant surgery to maintain nutritional status, manage weight, diabetes  [x]   [facility] inpatient RD will complete MNT for post kidney transplant prior to hospital discharge.    [x]   [facility] post renal transplant RD is available in [facility]   [x]   [facility] available with referral for MNT for diabetes and weight management.          Leone Haven, RD, CDE  Outpatient Senior Dietitian  [facility] Pre-Kidney Transplant Dietitian

## 2019-05-08 NOTE — Progress Notes (Signed)
Dear [facility],  Please change this patients status to ACTIVE in [facility]. Thank you, Manuela Schwartz  Electronically entered by: Silas Sacramento on 05/08/2019 6:16:00 PM

## 2019-05-08 NOTE — Progress Notes (Signed)
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  ?  Weight: 259 lbs (117.5 kg) Height: 6'0?? BMI: 35 (as of Dec 09, 2018)  Guideline Max Weight:  295 lbs (BMI of 40)   ?  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  Listing Date: 12/30/2018  Accumulated List Date: 09/10/2008  Blood Type: AB  UNOS Points: 10.6  cPRA: 10.581%  (as of 12/17/2018) 49mm  (national): 1% (local): 0%  EPTS: 24%  Days since Last Serum: 116 SA Serum Past Due  Days since Last cPRA: 116  ?  Staff Warnings:   Staff Alerts: Visually Impaired // Refuses high risk organs- Please verify  this with patient  Special Programs:   ?  FDOD: 09/10/2008  Years on Dialysis: 10  Dialysis Type: HD Hemodialysis  Schedule: M/W/F AM   Dialysis Center:  [facility]  Phone:  [phone]  FAX: [phone]  Nephrologist: Marvis Moeller, MD Phone: [phone]  ?  Prior Transplants: 0  Notes for On-Call:  TAKES MIDODRINE 10MG  ON DIALYSIS DAYS. NO HYPOTENSIVE  EPISODES NOTED ON MOST RECENT DXU NOTES.  ?  Sulfa Allergy: No known sulfa allergy  Medical Notes:   Surgical Notes: CT abd/pelvis 03/12/2019  read by AP: min very distal eia  dz bil, but o/w no sig ca  ?  Encounters: Resee (04/14/2019),   Latest Encounter: Resee (04/14/2019) with Dr. 04/16/2019  ?  Past Medical History: asthma, back pain, FSGS, hx depression, GERD, gout,  heart murmur, hypercholestrerolemia, HTN, Legally blind, sellp apnea,  Other Pertinent Med Hx:  Past Surgical History: AVF creation, back surgery,   Immunologic History:  (06/19/07) pRBC 2u   ?  ?  STUDIES (within last two years)  ------------  CT Ab/Pelvis Surgeon Comments:    ?  CT ABDOMEN/PELVIS  (03/12/2019)  CT reviewed by Dr. 03/14/2019: min very distal eia dz bil, but o/w no sig ca  CT Abdomen and Pelvis WO Contrast 03/13/2019  [facility]  Result Impression  ?  1. ?Markedly atrophic kidneys with cystic replacement of the renal   parenchyma query polycystic kidney disease.  ?  2. ?Hepatosplenomegaly.   ?  3. ?No acute abdominal or pelvic pathology. ?    MYOCARDIAL PERFUSION SCAN (11/20/2018)  1. The pharmacological perfusion study is negative for ischemia.  2. There is not previous study available.  3. The global LV systolic function is normal.  4. Further evaluation with medical management is necessary at this time.  **Post-stress gated SPECT images demonstrated normal contractility with a  calculated LVEF of 61%    ECHO TTE (03/05/2019)  1. Normal left ventricular size with moderate concentric left ventricular  hypertrophy. EF 65%  2. grade 2 pseudo-normal diastolic LV dysfunction is present.  3. Overall left ventricular systolic function is grossly normal.  4. Mildly enlarged left atrium.  5. The aortic valve is structurally and functionally normal.  6. Mild thickened and/or calcified mitral leaflets.  7. There is trace mitral regurgitation.  8. Mild tricuspid insufficiency with normal estimated right heart pressure.  RVSP 19.00 mmHg      EKG (12/23/2018)  Sinus rhythm  Consider inferior infarct    X-RAY CHEST (03/12/2019)  ?  No acute cardiopulmonary process. ?    COLONOSCOPY (04/02/2018)  Melanosis in the colon.  No specimens collected.    HOLTER MONITOR (10/22/2018)  IMPRESSION:      This  24-hour  Holter  monitor  is  suggestive of  sinus  rhythm with  sinus tachycardia.  There were  PVCs and PACs. There were no  sustained  arrhythmias seen.  ?  CT OTHER (04/02/2017)  1. No acute intracranial abnormality.  2.  Right maxillary sinus near complete opacification.  Mild left maxillary  sinus mucosal thickening.  3. Mastoids are clear.     PPD (01/30/2019)  PPD negative 73mm induration    DXU  NOTES (04/08/2019)  03/09/2019: Plts 262,000    Serologies: (12/17/18) CMV (POS), EBV (POS), RPR (Nonreactive), EBV IgM  (NEG), HBcAb (NEG), HBsAg (NEG), HCV (NEG), HIV (NEG), Toxo IgG (NEG),  HBsAb Quant (>800)  ?  Functional Status: Limited due to visual impairment, ambulatory  ?  Support: Son Selinda Eon will be primary caregiver   ?  Clearances: SW (AW 04/09/19  Low Risk) FC (CI 04/10/19  Financially Cleared) RD 04/21/2019  ?  Selection: (12/09/2018) on-site selection: Dr. Chauncy Passy, Dr. Robyn Haber,  A.Watts  Insurance: (Primary) Medicare Part A&B  (Secondary) Medicare/M-Cal  Crossover    BIOPSY OTHER (01/29/2014)  FINAL DIAGNOSIS (Microscopic):  1. ? Left chest, thrombus, thrombectomy -  A. ? Blood clot.  B. ? Portion of fibrous tissue with marked necroinflammatory  infiltrate.  C. ? No evidence of malignancy.    BIOPSY LUNG (11/04/2013)  FINAL DIAGNOSIS (Gross Only):  1. ? Pulmonary artery, left, removal of foreign body -  a. ? Foreign body (clear plastic tube).    BIOPSY KIDNEY (10/01/2007)  Focal Segmental Glomerulosclerosis  Patchy interstitial fibrosis and ??thyroidzation type of tubular atrophy  ?            Electronically entered by: Silas Sacramento on 05/08/2019 6:15:00 PM

## 2019-05-08 NOTE — Progress Notes (Signed)
Spoke to patient. Reviewed his status will be changed to ??Ready??. This  means he can received an offer and any time. Verified and updated contact  infor. Reviewed estimated LOS, need for 2x/week post op clinic  visits first 8 weeks. Need for life-long IS. Pt is visually impaired and  request his son be allowed to stay/help meet his care needs while in the  hospital. Pt knows to contact us if any change to overall  clinical condition, demographics, insurance, requires blood transfusion,  develops infection tx with ABX. Has this writers name and contact info.  Electronically entered by: Silas Sacramento on 05/08/2019 6:29:00 PM

## 2019-05-11 NOTE — Progress Notes (Signed)
RE: Active  Updated status to active.  Electronically entered by: Berlinda Last on 05/11/2019 9:31:00 AM

## 2019-05-11 NOTE — Progress Notes (Signed)
Faxed clearance letter to Dr. Kasandra Knudsen, [facility] [place] Maker, and sent to patient's home  address.  Electronically entered by: Cindi Carbon on 05/11/2019 6:18:00 PM

## 2019-05-16 DIAGNOSIS — N186 End stage renal disease: Secondary | ICD-10-CM

## 2019-05-16 DIAGNOSIS — Z538 Procedure and treatment not carried out for other reasons: Secondary | ICD-10-CM

## 2019-05-16 DIAGNOSIS — Z87891 Personal history of nicotine dependence: Secondary | ICD-10-CM

## 2019-05-16 DIAGNOSIS — Z20828 Contact with and (suspected) exposure to other viral communicable diseases: Secondary | ICD-10-CM

## 2019-05-16 DIAGNOSIS — H548 Legal blindness, as defined in USA: Secondary | ICD-10-CM

## 2019-05-16 DIAGNOSIS — M109 Gout, unspecified: Secondary | ICD-10-CM

## 2019-05-16 DIAGNOSIS — I120 Hypertensive chronic kidney disease with stage 5 chronic kidney disease or end stage renal disease: Secondary | ICD-10-CM

## 2019-05-16 DIAGNOSIS — M549 Dorsalgia, unspecified: Secondary | ICD-10-CM

## 2019-05-16 DIAGNOSIS — J45909 Unspecified asthma, uncomplicated: Secondary | ICD-10-CM

## 2019-05-16 DIAGNOSIS — N051 Unspecified nephritic syndrome with focal and segmental glomerular lesions: Secondary | ICD-10-CM

## 2019-05-16 DIAGNOSIS — E1122 Type 2 diabetes mellitus with diabetic chronic kidney disease: Secondary | ICD-10-CM

## 2019-05-16 DIAGNOSIS — Z992 Dependence on renal dialysis: Secondary | ICD-10-CM

## 2019-05-16 DIAGNOSIS — K219 Gastro-esophageal reflux disease without esophagitis: Secondary | ICD-10-CM

## 2019-05-16 DIAGNOSIS — Z01810 Encounter for preprocedural cardiovascular examination: Secondary | ICD-10-CM

## 2019-05-16 MED ORDER — ONDANSETRON HCL 4 MG TABLET: 4 mg | ORAL | Status: DC | PRN

## 2019-05-16 MED ORDER — METHYLPREDNISOLONE SODIUM SUCCINATE 500 MG INTRAVENOUS SOLUTION: 62.5 mg/mL | INTRAVENOUS | Status: DC

## 2019-05-16 MED ORDER — ONDANSETRON HCL (PF) 4 MG/2 ML INJECTION SOLUTION: 4 mg/2 mL | INTRAMUSCULAR | Status: DC | PRN

## 2019-05-16 MED ORDER — SODIUM CHLORIDE 0.9 % (FLUSH) INJECTION SYRINGE: 0.9 % | INTRAMUSCULAR | Status: DC

## 2019-05-16 MED ORDER — SODIUM CHLORIDE 0.9 % (FLUSH) INJECTION SYRINGE: 0.9 % | INTRAMUSCULAR | Status: DC | PRN

## 2019-05-16 NOTE — Progress Notes (Signed)
PT admitted for primary DDRT offer, offer declined due to biopsy results  after pt was admitted.  TYPE DONOR:  SCD  UNOS:  [id number]  [place]:  [id number]    Since Last Social Work Eval:  1. Name of adult primary caregiver who will help at home following  Mehr Depaoli: and is this person going to drive you to and from [place]  for the next 6 weeks? Son  2. If different from above, who is going to take care of you and drive you  to and from [place] for the next 6 weeks?  Same as above  3. Name of insurance and source of income:  Medicare A & B and Medicare /  M-Cal Crossover  4. Any inpatient psychiatric hospitalizations in the last 2 years?   NO   5. Has patient been drug/alcohol/substance/smoking free?  Yes   6. The risks and benefits of transplantation, the need for strict  medication regimen including taking anti-rejection medications the life of  the [place] and need to come to our clinic in [place] 1-2  times per week for the next 8 weeks (with decreasing frequency of  appointments thereafter) were all reviewed previously.  Answered patient?s  questions regarding logistics of Deyani Hegarty and admission process.    COVID-19 screening questions:    1. In the past 14 days, have you experienced any of the following new,  unusual or worsening symptoms? NO  (If yes to any of below questions, review with oncall surgeon PRIOR to  admitting patient)    *Fever, chills (temp >37.8/100 F)  *Cough  *Trouble breathing  *Sinus congestion or Runny nose  *Sore throat  *Nausea/vomiting  *Diarrhea  *Unexplained muscle aches  *Loss of taste or smell  *Eye redness or discharge    (unusual=not explained by a pre-existing condition)    2. In the last 30 days, have you been diagnosed with COVID-19? NO   (diagnosed = diagnosis or confirmed per a physician/provider)    3. In the last 14 days, have you had unprotected close contact with someone  diagnosed with COVID-19 (such as a household contact) NO    (unprotected =without  face covering or without appropriate PPE at work if a  Scientist, research (physical sciences))    (close contact = w/in 6 feet for 15 minutes or longer, live with a person  diagnosed with  COVID-19)    PT REMINDED OF VISITOR POLICY: YES   Visiting Hours ? 10 AM to 8 PM   NO visitor can stay the night   *If situation warrants need of family member to stay the night ie  developmentally delayed etc,  then Tx Coord will need to get approval from admitting supervisor.   1 visitor per day (can not swap visitors in any one given day).   ALL visitors must wear masks at [place] including in patient rooms    GENERAL questions:  Has patient been hospitalized, had any procedures or illness since our last  consultation with them?  Will patient accept blood transfusion if needed intra or postoperatively?   Does patient have a SULFA allergy?   Yes   No    (Check Care Everywhere if  time allows.  If no G6PD done request on admission)  Are serologies needed on admission?      Does the patient use CPAP?  No     If yes, please have him/her bring their CPAP machine to the hospital.  What is number of previous Noelle Sease(s)?  0  Most current cRPA/Date:11% / 12-17-2018  Was the last antibody test done within last 3 months?  No  If above No, did Dr. Warnell Forester approve the use of >56 month old serum?  No  Has patient received a blood transfusion since last antibody test?  No  Did pt have any infection that required antibiotics since last antibody  test?  Yes  If applicable, any pregnancy since the last antibody test?  N/A    Admissions, KTU, OR, nephrologist, resident, research coordinator and  patient?s nephrologist all informed of patients admission. Yitzel Shasteen  Notification form sent.    FINAL XMATCH:      Only single specimen (drawn on 12/17/2018) has been tested for HLA antibodies  for this candidate. This serum does not have donor-specific antibodies  (DSA) against this donor. Therefore, the VXM is predicted to  be negative and DSA negative. However,  per our policy, we must to test  another serum sample to confirm the results of the first serum. Therefore,  I recommend a Flow Crossmatch on patient?s admit serum. Please  call the [facility] on-call team for Flow crossmatch, and send [place] one red top  blood (for serum) from patient on admit. We will report the flow crossmatch  as soon as the flow crossmatch results become available.    Please call me if you have questions. Thank you.    Verline Lema, Ph.D., D(ABHI)  Professor of Clinical Surgery, Department of Surgery  Director, Immunogenetics and Transplantation Laboratory  [place] [facility] [place], [place]    On Engineer, technical sales and [employer] notified of x-match results.                          Electronically entered by: Wilber Oliphant on 05/16/2019 12:03:00 AM

## 2019-05-16 NOTE — Other (Signed)
URGENT ADMIT    Patient: Cody Myers  MRN: [id number]  [place]: [id number]    Received call from [facility] Transplant Coordinator at 18:40, contact # [phone]  Patient will be coming from Home, ETA - is en route, ETA 21:00    Dx: ESRD  Tx: Kidney Transplant    Service:  KTU  ELOS (days): 5-7    Nursing unit: 9L  Ok to board: Yes  Tele/CPO/Room precautions or needs: none LEGALLY BLIND; SON WILL STAY WITH PATIENT. NEEDS PRIVATE ROOM      COVID Screening or Test:Asymptomatic, Not Tested (in past 96 hours) asymptomatic    Admitting/Attending: Paulita Fujita ([id number])    Chart review/additional notes: 43 y.o. male with ESRD secondary to FSGS and HTN (since 1990), who initiated dialysis in 09/11/18

## 2019-05-16 NOTE — Anesthesia Pre-Procedure Evaluation (Signed)
[facility]  Anesthesia Preprocedure Evaluation    Procedure Information     Case: [id number] Date/Time: 05/17/19 0600    Procedure: KIDNEY TRANSPLANT CADAVERIC (N/A Abdomen)    Diagnosis: ESRD (end stage renal disease) (CMS code) [N18.6]    Pre-op diagnosis: ESRD (end stage renal disease) (CMS code) [N18.6]    Location: [place] [facility]: [place]. Paulita Fujita, MD          Precautions          None          Relevant Problems   No relevant active problems       Previous Anesthesia Records Displaying the 5 most recent records    Date   Procedure Department Responsible Provider Complications Comp/Issues @ Handoff Comp/Issues @ Post Op    12/23/18  See report for details SURGERY [facility] -  - -    07/04/18  See report for details SURGERY [facility] -  - -    04/02/18  See report for details ENDOSCOPY [facility] -  - -    02/15/17  See report for details SURGERY [place] -  - -    07/28/14  See report for details SURGERY [facility] all records in Chart Review            Anesthesia Encounter History        CC/HPI/Past Medical History Summary: Cody Myers is a 43 y.o. 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     PMH:  # HTN  # GERD - on omeprazole  # chronic back pain: on oxycodone 10    Etiology of renal failure: FSGS and HTN  Mode of dialysis: HD via R AVF (prior use of L AVF and graft)  Dialysis regimen: MWF  Last dialysis: 11/27 AM, initiated dialysis: 2010  Estimated native UO: none    (Please refer to APeX Allergies, Problems, Past Medical History, Past Surgical History, Social History, and Family History activities, Results for current data from these respective sections of the chart; these sections of the chart are also summarized in reports, including the Patient Summary Extracts found in Chart Review)      Summary of Outside Records:    myocardial perfusion scan 11/20/18 w/ no evidence of ischemia   TTE 03/05/19 w/ EF 65% without any additional structural abnormalities.          Review of Systems    Physical Exam    Prepare (Pre-Operative Clinic) Assessment/Plan/Narrative    Stop [name] information    Anesthesia Assessment and Plan  (See Anesthesia Record for attending attestation)    [Please note, smart link data included in this note may not reflect changes since note creation. Please see appropriate section of APeX for up-to-the minute information.]

## 2019-05-16 NOTE — H&P (Signed)
KIDNEY RECIPIENT - PREOPERATIVE H&P       History of Present Illness  43 yo man who is legally blind 2/2 severe conjunctivitis and has ESRD 2/2 FSGS (dx on biopsy in 09/2007) and HTN who began HD in 2010 and now presents for DDRT. He states that he is feeling well without any specific complaints. Of note, he does take oxycodone 10mg  as needed for pain associated with back pain. No prior abdominal surgical history.    In terms of his transplant workup, he underwent a myocardial perfusion scan on 11/20/18 that demonstrated no evidence of ischemia. TTE on 03/05/19 showed an EF 65% without any additional structural abnormalities. CT abdomen/pelvis on 03/12/19 showed mild atherosclerosis of the bilateral distal external iliac arteries and (as expected) atrophic kidneys but was otherwise unremarkable.     He underwent a single serum crossmatch evaluation in 12/2018 that did not identify any DSAs, but he does require a repeat flow crossmatch today prior to transplant.    Problem   Esrd (End Stage Renal Disease) On Dialysis (Cms Code)       Etiology of renal failure: FSGS and HTN    Mode of dialysis: HD via R AVF (prior use of L AVF and graft)  Dialysis regimen: MWF  Last dialysis: 11/27 AM, initiated dialysis: 2010    Estimated native UO: none    History of UTI: no  Difficulty voiding?: Not applicable    Past Medical History:   Diagnosis Date    Asthma     Inhalers    Back pain     Blood transfusion without reported diagnosis     BMI 35.0-35.9,adult     Height 6 foot even - weight 260 pounds for BMI 36.      Chronic kidney disease 2009    ESRD due FSGS / DM.    Need to clarify FSGS by biopsy.  Noted in evaluation with [facility] but no further documentation of FSGS by Nephrology.     Depression     not taking medication    GERD (gastroesophageal reflux disease)     Gout     Heart murmur     Hypercholesterolemia     Hypertension     2018 BP dropping on dialysis - MD ordered Midodrine 5 mg for SBD <100    Legally blind  2010    Conjunctiva disorder with prolapse right eye.  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    MRSA (methicillin resistant Staphylococcus aureus)     Sleep apnea     Thromboembolism (CMS code)     Dialysis graft only     Past Surgical History:   Procedure Laterality Date    AV FISTULA PLACEMENT Right 2016    BACK SURGERY      Bilateral Eye Surgery      CENTRAL VENOUS CATHETER  2015    Place and removed once fistula working    COLONOSCOPY  03/2018    Negative for polyps - no specimens taken    DIALYSIS FISTULA CREATION Left 2010    Revision 2014    TOOTH EXTRACTION  2018    Wound Vac  2015    Dehiscence anterior torso - chest         There is no immunization history on file for this patient.    Allergies: Aspirin and Lactose     Medications Prior to Admission   Medication Sig    albuterol 90 mcg/actuation metered dose inhaler Inhale  2 puffs into the lungs    clonidine HCl (CLONIDINE ORAL) Take 0.1 mg by mouth daily as needed       midodrine (PROAMATINE) 5 mg tablet Take 10 mg by mouth 3 (three) times a week on Mondays, Wednesdays, and Fridays On dialysis days only.      MINOXIDIL ORAL Take 10 mg by mouth daily       omeprazole (PRILOSEC) 40 mg capsule Take 40 mg by mouth daily    oxyCODONE-acetaminophen (PERCOCET) 10-325 mg tablet Take 1 tablet by mouth every 4 (four) hours as needed for Pain    predniSONE (DELTASONE) 5 mg tablet Take 5 mg by mouth daily    sucroferric oxyhydroxide (VELPHORO ORAL) Take by mouth     Scheduled Meds:  Continuous Infusions:  PRN Meds:    Social History     Socioeconomic History    Marital status: Single     Spouse name: Not on file    Number of children: 4    Years of education: 12    Highest education level: Associate degree: occupational, Hotel manager, or vocational program   Occupational History    Occupation: Disabled Administrator   Tobacco Use    Smoking status: Former Smoker     Packs/day: 1.00     Years: 4.00     Pack years: 4.00     Types: Cigarettes     Quit  date: 06/19/2007     Years since quitting: 11.9    Smokeless tobacco: Never Used   Substance and Sexual Activity    Alcohol use: Not Currently    Drug use: Not Currently    Sexual activity: Not Currently       Review of Systems   Constitutional: Negative for chills, fever and malaise/fatigue.   Respiratory: Negative for shortness of breath.    Cardiovascular: Negative for chest pain.   Gastrointestinal: Negative for abdominal pain, constipation, diarrhea, nausea and vomiting.   Genitourinary: Negative for dysuria.   Neurological: Negative for focal weakness.       Vitals  Temp:  [35.8 C (96.5 F)] 35.8 C (96.5 F)  Heart Rate:  [62] 62  *Resp:  [20] 20  BP: (147)/(95) 147/95  SpO2:  [100 %] 100 %    Input / Output  No intake/output data recorded.    Physical Exam  Vitals signs reviewed.   Constitutional:       Appearance: Normal appearance.   HENT:      Head: Normocephalic and atraumatic.   Cardiovascular:      Rate and Rhythm: Normal rate.      Pulses:           Dorsalis pedis pulses are 2+ on the right side and 2+ on the left side.        Posterior tibial pulses are 2+ on the right side and 2+ on the left side.   Pulmonary:      Effort: Pulmonary effort is normal.   Abdominal:      General: There is no distension.      Palpations: Abdomen is soft.      Tenderness: There is no abdominal tenderness. There is no guarding or rebound.   Skin:     General: Skin is warm and dry.   Neurological:      General: No focal deficit present.      Mental Status: He is alert and oriented to person, place, and time.   Psychiatric:  Mood and Affect: Mood normal.         Behavior: Behavior normal.         Data    Recent Labs     05/16/19  2140   WBC 3.6   HGB 12.7*   HCT 40.0*   PLT 203   PT 13.1   INR 1.0   PTT 26.1       Transplant Immunology  Blood type: AB  Prior Transplant: No  Most Recent PRA: 11%  Date: 12/17/2018    Radiology Results  CT Abdomen and Pelvis WO Contrast     03/13/2019  [facility]  Result  Impression     1. Markedly atrophic kidneys with cystic replacement of the renal   parenchyma query polycystic kidney disease.    2. Hepatosplenomegaly.     3. No acute abdominal or pelvic pathology.        Problem-based Assessment & Plan  43 yo man who is legally blind 2/2 severe conjunctivitis and has ESRD 2/2 FSGS (dx on biopsy in 09/2007) and HTN who began HD in 2010 and now presents for DDRT.    - admission labs, EKG, CXR  - send red top serum sample to [facility] STAT for flow crossmatch  - collect single green top tube and red top tube for research  - consent signed, OR expected in AM pending donor OR time  - NPO, no mIVF    Code Status: FULL    Robertson Colclough Rolla Flatten, MD  05/16/2019

## 2019-05-17 ENCOUNTER — Inpatient Hospital Stay
Admit: 2019-05-17 | Discharge: 2019-05-17 | Disposition: A | Discharge status: Home / Self Care | Payer: MEDICARE | Source: Ambulatory Visit | Attending: Physician | Admitting: Physician

## 2019-05-17 LAB — CARBON DIOXIDE, TOTAL (INCLUDE
Anion Gap: 17 — ABNORMAL HIGH (ref 4–14)
Carbon Dioxide, Total: 29 mmol/L (ref 22–29)

## 2019-05-17 LAB — COMPLETE BLOOD COUNT WITH DIFF
Abs Basophils: 0.03 10*9/L (ref 0.00–0.10)
Abs Eosinophils: 0.07 10*9/L (ref 0.00–0.40)
Abs Imm Granulocytes: 0.01 10*9/L (ref <0.10)
Abs Lymphocytes: 1.35 10*9/L (ref 1.00–3.40)
Abs Monocytes: 0.42 10*9/L (ref 0.20–0.80)
Abs Neutrophils: 1.7 10*9/L — ABNORMAL LOW (ref 1.80–6.80)
Hematocrit: 40 % — ABNORMAL LOW (ref 41.0–53.0)
Hemoglobin: 12.7 g/dL — ABNORMAL LOW (ref 13.6–17.5)
MCH: 27.9 pg (ref 26.0–34.0)
MCHC: 31.8 g/dL (ref 31.0–36.0)
MCV: 88 fL (ref 80–100)
Platelet Count: 203 10*9/L (ref 140–450)
RBC Count: 4.55 10*12/L (ref 4.40–5.90)
WBC Count: 3.6 10*9/L (ref 3.4–10.0)

## 2019-05-17 LAB — ASPARTATE TRANSAMINASE: AST: 11 U/L (ref 5–44)

## 2019-05-17 LAB — PHOSPHORUS, SERUM / PLASMA: Phosphorus, Serum / Plasma: 5.4 mg/dL — ABNORMAL HIGH (ref 2.3–4.7)

## 2019-05-17 LAB — COVID-19 RNA, RT-PCR/NUCLEIC A: COVID-19 RNA, RT-PCR/Nucleic A: NOT DETECTED

## 2019-05-17 LAB — CADAVER DONOR ABO CHECK

## 2019-05-17 LAB — ALBUMIN, SERUM / PLASMA: Albumin, Serum / Plasma: 4 g/dL (ref 3.5–5.0)

## 2019-05-17 LAB — PREPARE RBC: RBCs - Units Ready: 2

## 2019-05-17 LAB — CREATININE, SERUM / PLASMA
Creatinine: 13.75 mg/dL — ABNORMAL HIGH (ref 0.73–1.24)
eGFR - high estimate: 4 mL/min — ABNORMAL LOW (ref >59)
eGFR - low estimate: 4 mL/min — ABNORMAL LOW (ref >59)

## 2019-05-17 LAB — ALKALINE PHOSPHATASE: Alkaline Phosphatase: 86 U/L (ref 38–108)

## 2019-05-17 LAB — UREA NITROGEN, SERUM / PLASMA: Urea Nitrogen, Serum / Plasma: 66 mg/dL — ABNORMAL HIGH (ref 7–25)

## 2019-05-17 LAB — ALANINE TRANSAMINASE: Alanine transaminase: 9 U/L — ABNORMAL LOW (ref 10–61)

## 2019-05-17 LAB — URIC ACID, SERUM / PLASMA: Uric Acid, Serum / Plasma: 7 mg/dL (ref 3.9–8.2)

## 2019-05-17 LAB — PROTHROMBIN TIME
Int'l Normaliz Ratio: 1 (ref 0.9–1.2)
PT: 13.1 s (ref 11.7–15.1)

## 2019-05-17 LAB — MAGNESIUM, SERUM / PLASMA: Magnesium, Serum / Plasma: 2.4 mg/dL (ref 1.6–2.6)

## 2019-05-17 LAB — PROTEIN, TOTAL, SERUM / PLASMA: Protein, Total, Serum / Plasma: 7.7 g/dL (ref 6.3–8.6)

## 2019-05-17 LAB — ACTIVATED PARTIAL THROMBOPLAST: Activated Partial Thromboplast: 26.1 s (ref 20.9–30.9)

## 2019-05-17 LAB — SODIUM, SERUM / PLASMA: Sodium, Serum / Plasma: 141 mmol/L (ref 135–145)

## 2019-05-17 LAB — POTASSIUM, SERUM / PLASMA: Potassium, Serum / Plasma: 4.3 mmol/L (ref 3.5–5.0)

## 2019-05-17 LAB — GAMMA-GLUTAMYL TRANSPEPTIDASE: Gamma-Glutamyl Transpeptidase: 14 U/L (ref 11–84)

## 2019-05-17 LAB — CALCIUM, TOTAL, SERUM / PLASMA: Calcium, total, Serum / Plasma: 8.3 mg/dL — ABNORMAL LOW (ref 8.4–10.5)

## 2019-05-17 LAB — CHLORIDE, SERUM / PLASMA: Chloride, Serum / Plasma: 95 mmol/L — ABNORMAL LOW (ref 101–110)

## 2019-05-17 LAB — BILIRUBIN, DIRECT: Bilirubin, Direct: 0.2 mg/dL (ref <0.6)

## 2019-05-17 LAB — GLUCOSE, NON-FASTING: Glucose, non-fasting: 70 mg/dL (ref 70–199)

## 2019-05-17 LAB — BILIRUBIN, TOTAL: Bilirubin, Total: 0.7 mg/dL (ref 0.2–1.2)

## 2019-05-17 NOTE — Progress Notes (Signed)
[id number]  Per Dr. Paulita Fujita call patient and take him off standby.    Called patient to release him off standby.  Patient verbalized  understanding.  Electronically entered by: Cleda Mccreedy on 05/17/2019 10:13:00 PM

## 2019-05-17 NOTE — Discharge Summary (Signed)
[facility] - DISCHARGE SUMMARY     Patient Name: Cody Myers  Patient MRN: [id number]  Date of Birth: January 29, 1976    Facility: [facility]  Attending Physician: Nils FlackNANCY L. 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 He states that he is feeling well without any specific complaints. Of note, he does take oxycodone 10mg  as needed for pain associated with back pain. No prior abdominal surgical history.    In terms of his transplant workup, he underwent a myocardial perfusion scan on 11/20/18 that demonstrated no evidence of ischemia. TTE on 03/05/19 showed an EF 65% without any additional structural abnormalities. CT abdomen/pelvis on 03/12/19 showed mild atherosclerosis of the bilateral distal external iliac arteries and (as expected) atrophic kidneys but was otherwise unremarkable.       Brief Hospital Course by Problem  [name] was admitted for a planned deceased donor kidney transplant; however, a biopsy performed on the donor kidney revealed a number of structural issues that made it unsuitable for transplant. Therefore, the case was cancelled, and [name] was discharged home with the understanding that he retained his place on the transplant list and would be called again when another offer was identified.    Physical Exam at Discharge  BP (!) 147/95 (BP Location: Left upper arm, Patient Position: Lying)   Pulse 62   Temp (!) 35.8 C (96.5 F) (Oral)   Resp 20   Wt (!) 118.3 kg (260 lb 12.9 oz)   SpO2 100%   BMI 36.89 kg/m       Intake/Output Summary (Last 24 hours) at 05/17/2019 0019  Last data filed at 05/16/2019 2232  Gross per 24 hour   Intake    Output 0 ml   Net 0 ml        Physical Exam  Vitals signs reviewed.   Constitutional:       Appearance: Normal appearance.   HENT:      Head: Normocephalic and atraumatic.   Cardiovascular:      Rate and Rhythm: Normal rate.      Pulses:           Dorsalis pedis pulses are 2+ on the right side and 2+ on the left side.        Posterior tibial pulses are 2+ on the right side and 2+ on the left side.   Pulmonary:      Effort: Pulmonary effort is normal.   Abdominal:      General: There is no distension.      Palpations: Abdomen is soft.      Tenderness: There is no abdominal tenderness. There is no guarding or rebound.   Skin:     General: Skin is warm and dry.   Neurological:      General: No focal deficit present.      Mental Status: He is alert and oriented to person, place, and time.   Psychiatric:         Mood and Affect: Mood normal.         Behavior: Behavior normal.     Relevant Labs, Radiology, and Other Studies  CBC        05/16/19  2140   WBC 3.6   HGB 12.7*   HCT 40.0*   PLT 203     Coags        05/16/19  2140   PTT 26.1   INR 1.0     Chem7        05/16/19  2140   NA 141   K 4.3   CL 95*   CO2 29   BUN 66*   CREAT 13.75*   GLU 70     Electrolytes        05/16/19  2140   CA 8.3*   MG 2.4   PO4 5.4*     Liver Panel        05/16/19  2140   AST 11   ALT 9*   ALKP 86   TBILI 0.7   TP 7.7   ALB 4.0       Procedures Performed and Complications  None    DISCHARGE INSTRUCTIONS    Discharge Diet  Renal Diet    Functional Assessment at Discharge/Activity Goals  No functional activity limits.    Allergies and Medications at Discharge    Allergies: Aspirin and Lactose    Your Medications at the End of This Hospitalization       Disp Refills Start End    albuterol 90 mcg/actuation metered dose inhaler        Sig - Route: Inhale 2 puffs into the lungs - Inhalation    Class: Historical Med    clonidine HCl (CLONIDINE ORAL)        Sig - Route: Take 0.1 mg by mouth daily as needed    - Oral    Class: Historical Med    midodrine (PROAMATINE) 5 mg  tablet        Sig - Route: Take 10 mg by mouth 3 (three) times a week on Mondays, Wednesdays, and Fridays On dialysis days only.   - Oral    Class: Historical Med    MINOXIDIL ORAL        Sig - Route: Take 10 mg by mouth daily    - Oral    Class: Historical Med    omeprazole (PRILOSEC) 40 mg capsule        Sig - Route: Take 40 mg by mouth daily - Oral    Class: Historical Med    oxyCODONE-acetaminophen (PERCOCET) 10-325 mg tablet        Sig - Route: Take 1 tablet by mouth every 4 (four) hours as needed for Pain - Oral    Class: Historical Med    predniSONE (DELTASONE) 5 mg tablet        Sig - Route: Take 5 mg by mouth daily - Oral    Class: Historical Med    sucroferric oxyhydroxide (VELPHORO ORAL)        Sig - Route: Take by mouth - Oral    Class: Historical Med            Discharge With Opioid Medications for:  CHRONIC PAIN    According to our records, this patient was taking opioid medications before this hospitalization.   We do not plan to manage refill and the Primary Care Provider should manage refills.   Please evaluate with each refill of an opioid that your patient meets the criteria for intensified opioid therapy with the goal to avoid escalation of chronic opioid use. Please ensure the patient  knows about alternatives for pain management and understands how to taper opioids.        Pending Tests  Pending Labs     Order Current Status    Research Venipuncture Only (NO Processing) Collected (05/16/19 2140)    Research Venipuncture Only (NO Processing) Collected (05/16/19 2140)    Research Venipuncture Only (NO Processing) Collected (05/16/19 2140)    HTLV-I/II Antibody In process    Immunology Hold In process    MRSA Culture In process    Varicella zoster virus IgG, serum In process    Type and Screen Preliminary result          Booked [facility] Appointments  No future appointments.      Case Management Services Arranged  Case Management Services Arranged: (all recorded)           Discharge  Assessment  Condition at discharge:  good       Primary Care Physician  None Per Patient Provider  Address: No address on file   Phone: None  Fax: [phone]     Outside Providers, for pending tests please use the following numbers:   For [place] [facility] - Please Call: [phone]    For [facility] - Please Call: [phone]   For [facility] - Please Call: [phone]    Signed,  Ledon Snare, MD  05/17/2019          Discharge Instructions provided to the patient (if any):      Patient Instructions    None

## 2019-05-17 NOTE — Plan of Care (Signed)
Patient arrived on unit in preparation for DDRT, patient is legally blind, anuric and AVF on RFA +/+ but 2nd cross matched is positive and transplant is subsequently cancelled and patient will be going home tomorrow morning as they lived in [place].

## 2019-05-17 NOTE — Progress Notes (Signed)
Patient on Standby at Home.    PRIMARY OFFER ACCEPTED  TYPE DONOR:  DCD  UNOS: [id number]  MATCH: [id number]    Since Last Social Work Eval:  1. Name of adult primary caregiver who will help at home following  transplant: and is this person going to drive you to and from [place]  for the next 6 weeks? Son  2. If different from above, who is going to take care of you and drive you  to and from [facility] for the next 6 weeks?  n/a  3. Name of insurance and source of income:  Medicare A & B and Medicare /  [employer]  4. Any inpatient psychiatric hospitalizations in the last 2 years?   No  5. Has patient been drug/alcohol/substance/smoking free?  Yes   6. The risks and benefits of transplantation, the need for strict  medication regimen including taking anti-rejection medications the life of  the transplant and need to come to our clinic in [place] 1-2  times per week for the next 8 weeks (with decreasing frequency of  appointments thereafter) were all reviewed previously.  Answered patient?s  questions regarding logistics of transplant and admission process.    COVID-19 screening questions:    1. In the past 14 days, have you experienced any of the following new,  unusual or worsening symptoms? NO  (If yes to any of below questions, review with on-call surgeon PRIOR to  admitting patient)    *Fever, chills (temp >37.8/100 F)  *Cough  *Trouble breathing  *Sinus congestion or Runny nose  *Sore throat  *Nausea/vomiting  *Diarrhea  *Unexplained muscle aches  *Loss of taste or smell  *Eye redness or discharge    (unusual=not explained by a pre-existing condition)    2. In the last 30 days, have you been diagnosed with COVID-19? NO   (diagnosed = diagnosis or confirmed per a physician/provider)    3. In the last 14 days, have you had unprotected close contact with someone  diagnosed with COVID-19 (such as a household contact) NO    (unprotected =without face covering or without appropriate PPE at work if  a  Scientist, research (physical sciences))    (close contact = w/in 6 feet for 15 minutes or longer, live with a person  diagnosed with  COVID-19)    PT REMINDED OF VISITOR POLICY: YES   Visiting Hours ? 10 AM to 8 PM   NO visitor can stay the night   *If situation warrants need of family member to stay the night ie  developmentally delayed etc,  then Tx Coord will need to get approval from admitting supervisor.   1 visitor per day (can not swap visitors in any one given day).   ALL visitors must wear masks at [place] including in patient rooms    GENERAL questions:  Has patient been hospitalized, had any procedures or illness since our last  consultation with them?  No  Will patient accept blood transfusion if needed intra or postoperatively?  Yes  Does patient have a SULFA allergy?   No  Are serologies needed on admission?  Yes    Does the patient use CPAP?  No       What is number of previous transplant(s)?  0  Most current cRPA/Date: 10,581% / 12/17/2018  Was the last antibody test done within last 3 months?  No  If above No, did Dr. Warnell Forester approve the use of >76 month old serum?  No  Has patient received a blood transfusion since last antibody test?  No  Did pt have any infection that required antibiotics since last antibody  test?  Yes  If applicable, any pregnancy since the last antibody test?  N/A    FINAL XMATCH:      Only single specimen (drawn on 12/17/2018) has been tested for HLA antibodies  for this candidate. This serum does not have donor-specific antibodies  (DSA) against this donor. Therefore, the VXM is predicted to  be negative and DSA negative. However, per our policy, we must to test  another serum sample to confirm the results of the first serum. Therefore,  I recommend a Flow Crossmatch on patient?s admit serum.    Verline Lema, Ph.D., D(ABHI)  Professor of Clinical Surgery, Department of Surgery  Director, Immunogenetics and Transplantation Laboratory  [place] [facility] [place], [facility]    Diplomatic Services operational officer  and Donor Network [place] notified of x-match results.    Electronically entered by: Chyrel Masson on 05/17/2019 6:45:00 PM

## 2019-05-18 LAB — ECG 12-LEAD
Atrial Rate: 61 {beats}/min
Calculated P Axis: -3 degrees
Calculated R Axis: -5 degrees
Calculated T Axis: 2 degrees
P-R Interval: 170 ms
QRS Duration: 90 ms
QT Interval: 482 ms
QTcb: 485 ms
Ventricular Rate: 61 {beats}/min

## 2019-05-18 LAB — VARICELLA ZOSTER VIRUS IGG, SE: Varicella-zoster Antibody, IgG: 1976 Index — ABNORMAL HIGH (ref <135.0)

## 2019-05-18 LAB — IMMUNOLOGY HOLD

## 2019-05-18 LAB — MRSA CULTURE

## 2019-05-19 NOTE — Progress Notes (Signed)
Fedexed 1 red top to patient's dialysis unit for blood draw, enclosed fedex  mailer to have tube returned to [facility]. Called unit to notify them tube needs  to be drawn ASAP.  Electronically entered by: Cindi Carbon on 05/19/2019 11:02:00 AM

## 2019-05-19 NOTE — Progress Notes (Signed)
[id number]  Ki [id number]  Offer pending at this time.    Only single specimen (drawn on 12/17/2018) has been tested for HLA antibodies  for this candidate. This serum does not have donor-specific antibodies  (DSA) against this donor. Therefore, the VXM is predicted to  be negative and DSA negative. However, per our policy, we must to test  another serum sample to confirm the results of the first serum. Therefore,  I recommend a Flow Crossmatch on patient?s admit serum. Please  call the [facility] on-call team for Flow crossmatch, and send [place] one red top  blood (for serum) from patient on admit. We will report the flow crossmatch  as soon as the flow crossmatch results become available.        Please call me if you have questions. Thank you.      Sincerely,      Verline Lema, Ph.D., D(ABHI)    Electronically entered by: Wilber Oliphant on 05/19/2019 5:20:00 AM

## 2019-05-19 NOTE — Progress Notes (Signed)
Patient on Standby at Home.    PRIMARY OFFER ACCEPTED **backup at time of note**  TYPE DONOR:  DBD Standard  UNOS: [id number]  [place]: [id number]    Since Last Social Work Eval:  1. Name of adult primary caregiver who will help at home following  transplant: and is this person going to drive you to and from [place]  for the next 6 weeks? Son  2. If different from above, who is going to take care of you and drive you  to and from [facility] for the next 6 weeks?  n/a  3. Name of insurance and source of income:  Medicare A & B and Medicare /  [employer]  4. Any inpatient psychiatric hospitalizations in the last 2 years?   No  5. Has patient been drug/alcohol/substance/smoking free?  Yes   6. The risks and benefits of transplantation, the need for strict  medication regimen including taking anti-rejection medications the life of  the transplant and need to come to our clinic in [place] 1-2  times per week for the next 8 weeks (with decreasing frequency of  appointments thereafter) were all reviewed previously.  Answered patient?s  questions regarding logistics of transplant and admission process.    COVID-19 screening questions:    1. In the past 14 days, have you experienced any of the following new,  unusual or worsening symptoms? NO  (If yes to any of below questions, review with on-call surgeon PRIOR to  admitting patient)    *Fever, chills (temp >37.8/100 F)  *Cough  *Trouble breathing  *Sinus congestion or Runny nose  *Sore throat  *Nausea/vomiting  *Diarrhea  *Unexplained muscle aches  *Loss of taste or smell  *Eye redness or discharge    (unusual=not explained by a pre-existing condition)    2. In the last 30 days, have you been diagnosed with COVID-19? NO   (diagnosed = diagnosis or confirmed per a physician/provider)    3. In the last 14 days, have you had unprotected close contact with someone  diagnosed with COVID-19 (such as a household contact) NO    (unprotected =without face covering or  without appropriate PPE at work if a  Scientist, research (physical sciences))    (close contact = w/in 6 feet for 15 minutes or longer, live with a person  diagnosed with  COVID-19)    PT REMINDED OF VISITOR POLICY: YES   Visiting Hours ? 10 AM to 8 PM   NO visitor can stay the night   *If situation warrants need of family member to stay the night ie  developmentally delayed etc,  then Tx Coord will need to get approval from admitting supervisor.   1 visitor per day (can not swap visitors in any one given day).   ALL visitors must wear masks at [place] including in patient rooms    GENERAL questions:  Has patient been hospitalized, had any procedures or illness since our last  consultation with them?  No  Will patient accept blood transfusion if needed intra or postoperatively?  Yes  Does patient have a SULFA allergy?   No  Are serologies needed on admission?  Yes    Does the patient use CPAP?  No       What is number of previous transplant(s)?  0  Most current cRPA/Date: 10.581% / 12/17/2018  Was the last antibody test done within last 3 months?  No  If above No, did Dr. Warnell Forester approve the  use of >21 month old serum?  No  Has patient received a blood transfusion since last antibody test?  No  Did pt have any infection that required antibiotics since last antibody  test?  Yes  If applicable, any pregnancy since the last antibody test?  N/A    FINAL XMATCH:      Only single specimen (drawn on 12/17/2018) has been tested for HLA antibodies  for this candidate. This serum does not have donor-specific antibodies  (DSA) against this donor. Therefore, the VXM is predicted to  be negative and DSA negative. However, per our policy, we must to test  another serum sample to confirm the results of the first serum. Therefore,  I recommend a Flow Crossmatch on patient?s admit serum.    Verline Lema, Ph.D., D(ABHI)  Professor of Clinical Surgery, Department of Surgery  Director, Immunogenetics and Transplantation Laboratory  [place] [facility]  [place], [facility]    Diplomatic Services operational officer and Donor Network [place] notified of x-match results.      Electronically entered by: Susa Griffins on 05/19/2019 6:53:00 PM

## 2019-05-20 ENCOUNTER — Inpatient Hospital Stay
Admit: 2019-05-20 | Discharge: 2019-05-27 | Disposition: A | Discharge status: Home Health | Payer: MEDICARE | Source: Ambulatory Visit | Attending: Physician | Admitting: Physician

## 2019-05-20 ENCOUNTER — Inpatient Hospital Stay: Admit: 2019-05-20 | Discharge: 2019-05-20 | Payer: MEDICARE

## 2019-05-20 DIAGNOSIS — Z79899 Other long term (current) drug therapy: Secondary | ICD-10-CM

## 2019-05-20 DIAGNOSIS — N269 Renal sclerosis, unspecified: Secondary | ICD-10-CM

## 2019-05-20 DIAGNOSIS — E7800 Pure hypercholesterolemia, unspecified: Secondary | ICD-10-CM

## 2019-05-20 DIAGNOSIS — Y830 Surgical operation with transplant of whole organ as the cause of abnormal reaction of the patient, or of later complication, without mention of misadventure at the time of the procedure: Secondary | ICD-10-CM

## 2019-05-20 DIAGNOSIS — Z20828 Contact with and (suspected) exposure to other viral communicable diseases: Secondary | ICD-10-CM

## 2019-05-20 DIAGNOSIS — Z79891 Long term (current) use of opiate analgesic: Secondary | ICD-10-CM

## 2019-05-20 DIAGNOSIS — I10 Essential (primary) hypertension: Secondary | ICD-10-CM

## 2019-05-20 DIAGNOSIS — G8929 Other chronic pain: Secondary | ICD-10-CM

## 2019-05-20 DIAGNOSIS — N186 End stage renal disease: Secondary | ICD-10-CM

## 2019-05-20 DIAGNOSIS — Z0189 Encounter for other specified special examinations: Secondary | ICD-10-CM

## 2019-05-20 DIAGNOSIS — R9431 Abnormal electrocardiogram [ECG] [EKG]: Secondary | ICD-10-CM

## 2019-05-20 DIAGNOSIS — F329 Major depressive disorder, single episode, unspecified: Secondary | ICD-10-CM

## 2019-05-20 DIAGNOSIS — Y832 Surgical operation with anastomosis, bypass or graft as the cause of abnormal reaction of the patient, or of later complication, without mention of misadventure at the time of the procedure: Secondary | ICD-10-CM

## 2019-05-20 DIAGNOSIS — M899 Disorder of bone, unspecified: Secondary | ICD-10-CM

## 2019-05-20 DIAGNOSIS — T8619 Other complication of kidney transplant: Secondary | ICD-10-CM

## 2019-05-20 DIAGNOSIS — N250 Renal osteodystrophy: Secondary | ICD-10-CM

## 2019-05-20 DIAGNOSIS — Z9189 Other specified personal risk factors, not elsewhere classified: Secondary | ICD-10-CM

## 2019-05-20 DIAGNOSIS — I120 Hypertensive chronic kidney disease with stage 5 chronic kidney disease or end stage renal disease: Secondary | ICD-10-CM

## 2019-05-20 DIAGNOSIS — E875 Hyperkalemia: Secondary | ICD-10-CM

## 2019-05-20 DIAGNOSIS — Z48298 Encounter for aftercare following other organ transplant: Secondary | ICD-10-CM

## 2019-05-20 DIAGNOSIS — D631 Anemia in chronic kidney disease: Secondary | ICD-10-CM

## 2019-05-20 DIAGNOSIS — G4733 Obstructive sleep apnea (adult) (pediatric): Secondary | ICD-10-CM

## 2019-05-20 DIAGNOSIS — K219 Gastro-esophageal reflux disease without esophagitis: Secondary | ICD-10-CM

## 2019-05-20 DIAGNOSIS — H548 Legal blindness, as defined in USA: Secondary | ICD-10-CM

## 2019-05-20 LAB — CADAVER DONOR ABO CHECK

## 2019-05-20 LAB — TYPE AND SCREEN
ABO/RH(D): AB NEG
Antibody Screen: NEGATIVE
Blood Product Expiration Date: 202012102359
Blood Product Expiration Date: 202012132359
Blood Product Issue Date/Time: 202011251008
UDIV: 0
UDIV: 0
Unit Type and Rh: 2800
Unit Type and Rh: 2800

## 2019-05-20 LAB — COMPLETE BLOOD COUNT WITH DIFF
Abs Basophils: 0.04 10*9/L (ref 0.00–0.10)
Abs Eosinophils: 0.11 10*9/L (ref 0.00–0.40)
Abs Imm Granulocytes: 0.01 10*9/L (ref <0.10)
Abs Lymphocytes: 1.41 10*9/L (ref 1.00–3.40)
Abs Monocytes: 0.54 10*9/L (ref 0.20–0.80)
Abs Neutrophils: 2.2 10*9/L (ref 1.80–6.80)
Hematocrit: 38.4 % — ABNORMAL LOW (ref 41.0–53.0)
Hemoglobin: 12.1 g/dL — ABNORMAL LOW (ref 13.6–17.5)
MCH: 27.8 pg (ref 26.0–34.0)
MCHC: 31.5 g/dL (ref 31.0–36.0)
MCV: 88 fL (ref 80–100)
Platelet Count: 223 10*9/L (ref 140–450)
RBC Count: 4.35 10*12/L — ABNORMAL LOW (ref 4.40–5.90)
WBC Count: 4.3 10*9/L (ref 3.4–10.0)

## 2019-05-20 LAB — ALBUMIN, SERUM / PLASMA: Albumin, Serum / Plasma: 3.9 g/dL (ref 3.5–5.0)

## 2019-05-20 LAB — CREATININE, SERUM / PLASMA
Creatinine: 14.7 mg/dL — ABNORMAL HIGH (ref 0.73–1.24)
eGFR - high estimate: 4 mL/min — ABNORMAL LOW (ref >59)
eGFR - low estimate: 4 mL/min — ABNORMAL LOW (ref >59)

## 2019-05-20 LAB — BILIRUBIN, DIRECT: Bilirubin, Direct: 0.2 mg/dL (ref <0.6)

## 2019-05-20 LAB — ALKALINE PHOSPHATASE: Alkaline Phosphatase: 80 U/L (ref 38–108)

## 2019-05-20 LAB — PHOSPHORUS, SERUM / PLASMA: Phosphorus, Serum / Plasma: 5.1 mg/dL — ABNORMAL HIGH (ref 2.3–4.7)

## 2019-05-20 LAB — BILIRUBIN, TOTAL: Bilirubin, Total: 0.7 mg/dL (ref 0.2–1.2)

## 2019-05-20 LAB — HLA ANTIBODY REPORT

## 2019-05-20 LAB — IMMUNOLOGY HOLD

## 2019-05-20 LAB — PREPARE RBC: RBCs - Units Ready: 2

## 2019-05-20 LAB — PROTHROMBIN TIME
Int'l Normaliz Ratio: 1.1 (ref 0.9–1.2)
PT: 13.7 s (ref 11.7–15.1)

## 2019-05-20 LAB — ASPARTATE TRANSAMINASE: AST: 9 U/L (ref 5–44)

## 2019-05-20 LAB — UREA NITROGEN, SERUM / PLASMA: Urea Nitrogen, Serum / Plasma: 72 mg/dL — ABNORMAL HIGH (ref 7–25)

## 2019-05-20 LAB — COVID-19 RNA, RT-PCR/NUCLEIC A: COVID-19 RNA, RT-PCR/Nucleic A: NOT DETECTED

## 2019-05-20 LAB — CARBON DIOXIDE, TOTAL (INCLUDE
Anion Gap: 18 — ABNORMAL HIGH (ref 4–14)
Carbon Dioxide, Total: 28 mmol/L (ref 22–29)

## 2019-05-20 LAB — ACTIVATED PARTIAL THROMBOPLAST: Activated Partial Thromboplast: 26.5 s (ref 20.9–30.9)

## 2019-05-20 LAB — PROTEIN, TOTAL, SERUM / PLASMA: Protein, Total, Serum / Plasma: 7.7 g/dL (ref 6.3–8.6)

## 2019-05-20 LAB — POTASSIUM, SERUM / PLASMA: Potassium, Serum / Plasma: 4.5 mmol/L (ref 3.5–5.0)

## 2019-05-20 LAB — CALCIUM, TOTAL, SERUM / PLASMA: Calcium, total, Serum / Plasma: 8 mg/dL — ABNORMAL LOW (ref 8.4–10.5)

## 2019-05-20 LAB — CHLORIDE, SERUM / PLASMA: Chloride, Serum / Plasma: 94 mmol/L — ABNORMAL LOW (ref 101–110)

## 2019-05-20 LAB — MAGNESIUM, SERUM / PLASMA: Magnesium, Serum / Plasma: 2.3 mg/dL (ref 1.6–2.6)

## 2019-05-20 LAB — URIC ACID, SERUM / PLASMA: Uric Acid, Serum / Plasma: 7.5 mg/dL (ref 3.9–8.2)

## 2019-05-20 LAB — ALANINE TRANSAMINASE: Alanine transaminase: 7 U/L — ABNORMAL LOW (ref 10–61)

## 2019-05-20 LAB — SODIUM, SERUM / PLASMA: Sodium, Serum / Plasma: 140 mmol/L (ref 135–145)

## 2019-05-20 LAB — GAMMA-GLUTAMYL TRANSPEPTIDASE: Gamma-Glutamyl Transpeptidase: 13 U/L (ref 11–84)

## 2019-05-20 LAB — GLUCOSE, NON-FASTING: Glucose, non-fasting: 86 mg/dL (ref 70–199)

## 2019-05-20 MED ORDER — GLYCOPYRROLATE 1 MG/5 ML (0.2 MG/ML) INTRAVENOUS SYRINGE
1 | INTRAVENOUS | Status: DC | PRN
Start: 2019-05-20 — End: 2019-05-20
  Administered 2019-05-21: 08:00:00 via INTRAVENOUS

## 2019-05-20 MED ORDER — MANNITOL 25 % INTRAVENOUS SOLUTION
25 | INTRAVENOUS | Status: AC
Start: 2019-05-20 — End: ?

## 2019-05-20 MED ORDER — SODIUM CHLORIDE 0.9 % IV BOLUS
0.9 | INTRAVENOUS | Status: DC | PRN
Start: 2019-05-20 — End: 2019-05-20

## 2019-05-20 MED ORDER — NEOSTIGMINE METHYLSULFATE 1 MG/ML INTRAVENOUS SOLUTION
1 | INTRAVENOUS | Status: DC | PRN
Start: 2019-05-20 — End: 2019-05-20
  Administered 2019-05-21: 08:00:00 via INTRAVENOUS

## 2019-05-20 MED ORDER — ACETAMINOPHEN 500 MG TABLET
500 | Freq: Three times a day (TID) | ORAL | Status: DC | PRN
Start: 2019-05-20 — End: 2019-05-21

## 2019-05-20 MED ORDER — HEPARIN (PORCINE) 1,000 UNIT/ML INJECTION SOLUTION
1000 unit/mL | Freq: Once | INTRAMUSCULAR | Status: AC
Start: 2019-05-20 — End: 2019-05-21
  Administered 2019-05-21: 04:00:00 via INTRAVENOUS

## 2019-05-20 MED ORDER — ACETAMINOPHEN 500 MG GELCAP
500 | Freq: Once | ORAL | Status: DC | PRN
Start: 2019-05-20 — End: 2019-05-21

## 2019-05-20 MED ORDER — NEOMYCIN 40 MG-POLYMYXIN B 200,000 UNIT/ML GU IRRIGATION SOLUTION
40 | Status: AC
Start: 2019-05-20 — End: ?

## 2019-05-20 MED ORDER — LIDOCAINE (PF) 10 MG/ML (1 %) INJECTION SOLUTION
10 | INTRAMUSCULAR | Status: DC | PRN
Start: 2019-05-20 — End: 2019-05-20

## 2019-05-20 MED ORDER — METHYLPREDNISOLONE SODIUM SUCCINATE 500 MG INTRAVENOUS SOLUTION
62.5 mg/mL | INTRAVENOUS | Status: DC | PRN
  Administered 2019-05-21: 04:00:00 500 via INTRAVENOUS

## 2019-05-20 MED ORDER — HEPARIN (PORCINE) 1,000 UNIT/ML INJECTION SOLUTION
1000 | INTRAMUSCULAR | Status: AC
Start: 2019-05-20 — End: ?

## 2019-05-20 MED ORDER — NEOMYCIN 40 MG-POLYMYXIN B 200,000 UNIT/ML GU IRRIGATION SOLUTION
40 | Status: DC | PRN
Start: 2019-05-20 — End: 2019-05-21
  Administered 2019-05-21: 04:00:00 1

## 2019-05-20 MED ORDER — SODIUM CHLORIDE 0.9 % INTRAVENOUS SOLUTION
0.9 | INTRAVENOUS | Status: DC | PRN
Start: 2019-05-20 — End: 2019-05-20
  Administered 2019-05-21: 03:00:00 via INTRAVENOUS

## 2019-05-20 MED ORDER — FENTANYL (PF) 50 MCG/ML INJECTION SOLUTION
50 | INTRAMUSCULAR | Status: DC | PRN
Start: 2019-05-20 — End: 2019-05-21
  Administered 2019-05-21 (×2): via INTRAVENOUS

## 2019-05-20 MED ORDER — ONDANSETRON HCL (PF) 4 MG/2 ML INJECTION SOLUTION
4 | Freq: Four times a day (QID) | INTRAMUSCULAR | Status: DC | PRN
Start: 2019-05-20 — End: 2019-05-21

## 2019-05-20 MED ORDER — LIDOCAINE 4 % TOPICAL CREAM
4 | Freq: Once | TOPICAL | Status: AC | PRN
Start: 2019-05-20 — End: 2019-05-20
  Administered 2019-05-20: 15:00:00 via TOPICAL

## 2019-05-20 MED ORDER — ELECTROLYTE-A INTRAVENOUS SOLUTION
INTRAVENOUS | Status: DC | PRN
  Administered 2019-05-21: 03:00:00 via INTRAVENOUS

## 2019-05-20 MED ORDER — LACTATED RINGERS INTRAVENOUS SOLUTION
INTRAVENOUS | Status: DC
Start: 2019-05-20 — End: 2019-05-21
  Administered 2019-05-21: 10:00:00 via INTRAVENOUS

## 2019-05-20 MED ORDER — SODIUM CHLORIDE 0.9 % DIALYSIS CIRCUIT
0.9 | Freq: Once | INTRAVENOUS | Status: AC
Start: 2019-05-20 — End: 2019-05-20
  Administered 2019-05-20: 18:00:00 via INTRAVENOUS

## 2019-05-20 MED ORDER — PROPOFOL 10 MG/ML INTRAVENOUS EMULSION
10 mg/mL | INTRAVENOUS | Status: DC | PRN
  Administered 2019-05-21: 03:00:00 via INTRAVENOUS

## 2019-05-20 MED ORDER — LIDOCAINE (PF) 100 MG/5 ML (2 %) INTRAVENOUS SYRINGE
100 mg/5 mL (2 %) | INTRAVENOUS | Status: DC | PRN
  Administered 2019-05-21: 03:00:00 via INTRAVENOUS

## 2019-05-20 MED ORDER — CEFAZOLIN 1 GRAM SOLUTION FOR INJECTION
1 gram | INTRAMUSCULAR | Status: DC | PRN
  Administered 2019-05-21 (×2): via INTRAVENOUS

## 2019-05-20 MED ORDER — HEPARIN (PORCINE) 1,000 UNIT/ML INJECTION SOLUTION
1000 | INTRAMUSCULAR | Status: DC | PRN
Start: 2019-05-20 — End: 2019-05-21
  Administered 2019-05-21: 05:00:00

## 2019-05-20 MED ORDER — SODIUM CHLORIDE 0.9 % DIALYSIS CIRCUIT
0.9 | Freq: Once | INTRAVENOUS | Status: AC
Start: 2019-05-20 — End: 2019-05-20
  Administered 2019-05-20: 15:00:00 via INTRAVENOUS

## 2019-05-20 MED ORDER — MIDAZOLAM 1 MG/ML INJECTION SOLUTION
1 mg/mL | INTRAMUSCULAR | Status: DC | PRN
  Administered 2019-05-21: 03:00:00 via INTRAVENOUS

## 2019-05-20 MED ORDER — SODIUM CHLORIDE 0.9 % (FLUSH) INJECTION SYRINGE
0.9 | Freq: Three times a day (TID) | INTRAMUSCULAR | Status: DC
Start: 2019-05-20 — End: 2019-05-26
  Administered 2019-05-21 – 2019-05-26 (×11): via INTRAVENOUS
  Administered 2019-05-26: 05:00:00 3 mL via INTRAVENOUS

## 2019-05-20 MED ORDER — FENTANYL (PF) 50 MCG/ML INJECTION SOLUTION
50 mcg/mL | INTRAMUSCULAR | Status: DC | PRN
  Administered 2019-05-21 (×5): via INTRAVENOUS

## 2019-05-20 MED ORDER — CEFAZOLIN 1 GRAM SOLUTION FOR INJECTION
1 | INTRAMUSCULAR | Status: AC
Start: 2019-05-20 — End: ?

## 2019-05-20 MED ORDER — CEFAZOLIN 1 GRAM SOLUTION FOR INJECTION
1 gram | INTRAMUSCULAR | Status: DC | PRN
Start: 2019-05-20 — End: 2019-05-21
  Administered 2019-05-21: 04:00:00 1000

## 2019-05-20 MED ORDER — SODIUM CHLORIDE 0.9 % (FLUSH) INJECTION SYRINGE
0.9 | INTRAMUSCULAR | Status: DC | PRN
Start: 2019-05-20 — End: 2019-05-26
  Administered 2019-05-21: 11:00:00

## 2019-05-20 MED ORDER — SODIUM CHLORIDE 0.9 % DIALYSIS CIRCUIT
0.9 | INTRAVENOUS | Status: DC | PRN
Start: 2019-05-20 — End: 2019-05-20
  Administered 2019-05-20 (×2): via INTRAVENOUS

## 2019-05-20 MED ORDER — HYDROMORPHONE 2 MG/ML INJECTION SOLUTION
2 mg/mL | INTRAMUSCULAR | Status: DC | PRN
  Administered 2019-05-21 (×4): via INTRAVENOUS

## 2019-05-20 MED ORDER — FUROSEMIDE 10 MG/ML INJECTION SOLUTION
10 mg/mL | INTRAMUSCULAR | Status: DC | PRN
  Administered 2019-05-21: 05:00:00 via INTRAVENOUS

## 2019-05-20 MED ORDER — LACTATED RINGERS INTRAVENOUS SOLUTION
INTRAVENOUS | Status: DC | PRN
Start: 2019-05-20 — End: 2019-05-20
  Administered 2019-05-21: 04:00:00 via INTRAVENOUS

## 2019-05-20 MED ORDER — MANNITOL 25 % INTRAVENOUS SOLUTION
25 % | INTRAVENOUS | Status: DC | PRN
  Administered 2019-05-21: 05:00:00 via INTRAVENOUS

## 2019-05-20 MED ORDER — PAPAVERINE 30 MG/ML INJECTION SOLUTION
30 | INTRAMUSCULAR | Status: AC
Start: 2019-05-20 — End: ?

## 2019-05-20 MED ORDER — CISATRACURIUM 2 MG/ML INTRAVENOUS SOLUTION
2 mg/mL | INTRAVENOUS | Status: DC | PRN
  Administered 2019-05-21 (×7): via INTRAVENOUS

## 2019-05-20 MED ORDER — METHYLPREDNISOLONE SODIUM SUCCINATE 500 MG INTRAVENOUS SOLUTION
62.5 mg/mL | Freq: Once | INTRAVENOUS | Status: DC
Start: 2019-05-20 — End: 2019-05-21

## 2019-05-20 MED FILL — THYMOGLOBULIN 25 MG INTRAVENOUS SOLUTION: 25 mg | INTRAVENOUS | Qty: 40

## 2019-05-20 MED FILL — SOLU-MEDROL 500 MG INTRAVENOUS SOLUTION: 500 mg | INTRAVENOUS | Qty: 8

## 2019-05-20 MED FILL — ANECREAM 4 % TOPICAL: 4 % | TOPICAL | Qty: 5

## 2019-05-20 NOTE — Assessment & Plan Note (Addendum)
Prior to transplant on velphoro.   Low calcium stable.   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  Continue sevelamer for now.

## 2019-05-20 NOTE — Assessment & Plan Note (Signed)
Has son helping out for care.

## 2019-05-20 NOTE — Op Note (Signed)
OPERATIVE REPORT    DATE OF OPERATION:  05/20/2019    PREOPERATIVE DIAGNOSIS:  End-stage renal failure.    POSTOPERATIVE DIAGNOSIS:  End-stage renal failure.    OPERATION:  1.  Back table preparation of a right kidney with a vena caval   extension graft.  2.  Deceased donor renal transplantation.    ANESTHESIA:  General endotracheal.    CLINICAL INDICATIONS:    FINDINGS:  A right kidney was prepared with a vena caval extension   graft.  Kidney had a single artery, which was prepared on a Carrel   patch.  The kidney was transplanted into patient's right iliac fossa.    The anastomoses were to the external iliac artery and external iliac   vein.  Both anastomoses performed in an end-to-side fashion.  The   ureter was brought underneath the spermatic cord and then anastomosed   to the bladder using standard extravesicular technique.  Due to the   small size of the ureter, we elected to perform the ureteral   anastomosis over a 6 x 16 cm double-J stent.  The estimated blood   loss was 100 mL.  The ischemic time was 41 minutes.    PROCEDURE:  Prior to the initiation of the operation, back table   preparation of the right kidney was performed.  The right renal vein   was mobilized in continuity with the vena cava and an extension graft   using the cava was performed using an extension superiorly and   inferiorly of the cava, which was run with running suture of 5-0   Surgilene.  The artery was also mobilized and prepared on a Carrel   patch of aorta.  The perinephric fat was removed.  The gonadal vessel   was removed and the kidney was placed back in ice slush in   anticipation of moving forward with the transplant.    Following the induction of general endotracheal anesthetic, a   standard right lower quadrant hockey-stick incision was created with   a lateral border incision along the lateral border of the rectus   sheath.  The incision was carried down through the subcutaneous   tissue.  The fascia was divided using  caution not to enter the   peritoneum.  The preperitoneal space was entered.  Self-retaining   retractors were placed.  Of note, the external iliac artery was quite   tortuous and there was a significant amount of adherence of the vein   to the artery.  The spermatic cord was looped with a vessel loop.    The inferior epigastric vessels were suture ligated and we replaced   our self-retaining retractors and now had good exposure of the iliac   system.  Overlying lymphatics were suture ligated and divided.    Following mobilization of both the artery and the vein, we were ready   to place the kidney.    Clamps were placed proximally and distally on both the external iliac   artery and external iliac vein.  The caval extension graft of the   right kidney was anastomosed to the external iliac vein in an   end-to-side fashion using a running suture of 5-0 Surgilene.  Then in   a more proximal position on the external iliac artery a #6 punch was   used to create a defect and the Carrel patch of donor aorta was   anastomosed to the external iliac artery in an end-to-side fashion   using a  running suture of 6-0 Prolene.  Following completion of these   anastomoses, the kidney was reperfused.  Total ischemic time was 40   minutes.  Of note, the kidney had excellent perfusion immediately   upon reperfusion.  Hemostasis was easily achieved, and at this point,   we placed the kidney in the right iliac fossa in a position where it   wanted to sit.    We then proceeded with distending the bladder.  The bladder was   distended using irrigating Foley system.  He had not made urine for   over 10 years and for that reason the bladder was quite shrunk and   encased from fibrotic tissue, which we mobilized.  Following   mobilization of the bladder, we created a cystotomy and then the   ureter was brought underneath the spermatic cord and anastomosed to   the bladder using a running suture of 6-0 Maxon.  The suture was   through the  mucosa of the ureter and the muscular mucosal layer of   the bladder in a running fashion over a 6 x 16 cm double-J stent.  We   felt that a stent was necessary secondary to the small size of the   ureter and the small size of his bladder.  Immediately upon finishing   this anastomosis, we began to collect urine, but it was not evident   that the kidney had immediate function.  Once again, we assured   hemostasis and good positioning of the vessels.  At this point, we   were ready for closure.  The fascia was closed with a running suture   of #1 PDS and interrupted pop-off sutures of #1 PDS.  The   subcutaneous tissue was irrigated with antibiotic solution,   subcutaneous tissue closed with a running suture of 4-0 Vicryl, and   finally the skin was closed with staples.  All sponge and needle   counts were reported as correct at the end of the case, and the   patient was returned to the recovery room, extubated, in stable   condition.    ATTENDING SURGEON:  Clarisa Kindred, MD    SURGICAL ATTENDANCE:  The assistance of Dr. Jon Gills was necessary   secondary to the complexity of the case and unavailability of a   qualified surgical resident.    ASSISTANT SURGEONS:  Colon Branch, MD  Malva Cogan, MD      DICTATED BY: Lubertha Basque. Nakshatra Klose, MD   RESPONSIBLE SURGEON: Lubertha Basque. Anayansi Rundquist, MD        D: 05/21/2019  11:36 A  T: 05/21/2019  10:50 P D101  Job #: [id number]

## 2019-05-20 NOTE — H&P (Signed)
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 He was admitted to [facility] last week for a possible DDRT however after a physical crossmatch was positive, he was discharged home.      He states that he is feeling well without any specific complaints, no changes since his last admission.   Of note, he does take oxycodone 10mg  as needed for pain associated with back pain. No prior abdominal surgical history.  Unsure of his blood pressure at home.  Denies having to stop HD due to hypotension.     Etiology of renal failure: FSGS/HTN    Mode of dialysis: HD RARM AVF  Dialysis regimen: MWF  Last dialysis: Monday 11/30 initiated dialysis: 2010    Estimated native UO: none, has not made urine in 10 years.     History of UTI: no  Difficulty voiding?: No  Problem   Esrd (End Stage Renal Disease) (Cms Code)           Past Medical History:   Diagnosis Date    Asthma     Inhalers    Back pain     Blood transfusion without reported diagnosis     BMI 35.0-35.9,adult     Height 6 foot even - weight 260 pounds for BMI 36.      Chronic kidney disease 2009    ESRD due FSGS / DM.    Need to clarify FSGS by biopsy.  Noted in evaluation with [facility] but no further documentation of FSGS by Nephrology.     Depression     not taking medication    GERD (gastroesophageal reflux disease)     Gout     Heart murmur     Hypercholesterolemia     Hypertension     2018 BP  dropping on dialysis - MD ordered Midodrine 5 mg for SBD <100    Legally blind 2010    Conjunctiva disorder with prolapse right eye.  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    MRSA (methicillin resistant Staphylococcus aureus)     Sleep apnea     Thromboembolism (CMS code)     Dialysis graft only     Past Surgical History:   Procedure Laterality Date    AV FISTULA PLACEMENT Right 2016    BACK SURGERY      Bilateral Eye Surgery      CENTRAL VENOUS CATHETER  2015    Place and removed once fistula working    COLONOSCOPY  03/2018    Negative for polyps - no specimens taken    DIALYSIS FISTULA CREATION Left 2010    Revision 2014    TOOTH EXTRACTION  2018    Wound Vac  2015    Dehiscence anterior torso - chest  There is no immunization history on file for this patient.    Allergies: Aspirin and Lactose     Medications Prior to Admission   Medication Sig    albuterol 90 mcg/actuation metered dose inhaler Inhale 2 puffs into the lungs    midodrine (PROAMATINE) 5 mg tablet Take 10 mg by mouth 3 (three) times a week on Mondays, Wednesdays, and Fridays On dialysis days only.      MINOXIDIL ORAL Take 10 mg by mouth daily       oxyCODONE-acetaminophen (PERCOCET) 10-325 mg tablet Take 1 tablet by mouth every 4 (four) hours as needed for Pain    sucroferric oxyhydroxide (VELPHORO ORAL) Take by mouth    clonidine HCl (CLONIDINE ORAL) Take 0.1 mg by mouth daily as needed       omeprazole (PRILOSEC) 40 mg capsule Take 40 mg by mouth daily    predniSONE (DELTASONE) 5 mg tablet Take 5 mg by mouth daily     Scheduled Meds:  Continuous Infusions:  PRN Meds:    Social History     Socioeconomic History    Marital status: Single     Spouse name: Not on file    Number of children: 4    Years of education: 12    Highest education level: Associate degree: occupational, Scientist, product/process development, or vocational program   Occupational History    Occupation: Disabled Naval architect   Tobacco Use    Smoking status: Former Smoker      Packs/day: 1.00     Years: 4.00     Pack years: 4.00     Types: Cigarettes     Quit date: 06/19/2007     Years since quitting: 11.9    Smokeless tobacco: Never Used   Substance and Sexual Activity    Alcohol use: Not Currently    Drug use: Not Currently    Sexual activity: Not Currently       Family History   Problem Relation Name Age of Onset    Diabetes Mother      Heart disease Mother      Hypertension Mother      Diabetes Father      Heart disease Father      Hypertension Father         Review of Systems   Constitutional: Negative for chills, diaphoresis, fever, malaise/fatigue and weight loss.   HENT: Negative for sore throat.    Eyes:        Blind   Respiratory: Negative for cough and shortness of breath.    Cardiovascular: Negative for chest pain, palpitations and leg swelling.   Gastrointestinal: Negative for abdominal pain, blood in stool, constipation, diarrhea, heartburn, melena, nausea and vomiting.   Genitourinary:        Anuric for 10 years   Neurological: Negative for dizziness, sensory change and headaches.       Vitals  Temp:  [36.6 C (97.9 F)] 36.6 C (97.9 F)  Heart Rate:  [66] 66  *Resp:  [18] 18  BP: (159)/(92) 159/92  SpO2:  [100 %] 100 %    Input / Output  No intake/output data recorded.    Physical Exam  Constitutional:       General: He is not in acute distress.     Appearance: He is obese. He is not ill-appearing.   Cardiovascular:      Rate and Rhythm: Normal rate and regular rhythm.      Pulses: Normal pulses.  Dorsalis pedis pulses are 2+ on the right side and 2+ on the left side.      Heart sounds: Murmur present. No friction rub. No gallop.       Comments: Right arm AVF +/+  Pulmonary:      Effort: No respiratory distress.      Breath sounds: No stridor.   Abdominal:      General: Bowel sounds are normal. There is no distension.      Palpations: Abdomen is soft. There is no mass.      Tenderness: There is no abdominal tenderness. There is no right CVA tenderness,  left CVA tenderness, guarding or rebound.      Hernia: No hernia is present.   Musculoskeletal:         General: Swelling present.   Skin:     General: Skin is warm and dry.   Psychiatric:         Mood and Affect: Mood normal.         Data    No results for input(s): WBC, HGB, HCT, PLT, NA, K, CL, CO2, BUN, CREAT, GLU, CA, MG, PO4, PT, INR, PTT, AMY, CYCL, TAC, SIRO in the last 72 hours.    Transplant Immunology  Blood type:  AB Neg  Prior Transplant: No  Most Recent PRA: 10.5%  Date: 12/17/18    Radiology Results  No results found.     XR CHEST 2 VIEWS PA AND LATERAL   05/16/2019 9:53 PM  HISTORY: Immediate read  is required  and please call ordering physician with the result.  COMPARISON: None  IMPRESSION:   FINDINGS/IMPRESSION:  Cardiomediastinal silhouette is normal in size. No pleural effusion or pneumothorax. Lungs are clear.      Preoperative Studies:  Test: Diagnostics - Radiology - CT Abdomen/Pelvis  Date: 03/12/2019 12:00 AM  CT reviewed by Dr. Agnes LawrencePosselt: min very distal eia dz bil, but o/w no sig ca  **Robyn HaberDiaz, Susan 04/12/2019 03:43 PM**  CT Abdomen and Pelvis WO Contrast 03/13/2019  [facility]  Result Impression  1. Markedly atrophic kidneys with cystic replacement of the renal   parenchyma query polycystic kidney disease.  2. Hepatosplenomegaly.   3. No acute abdominal or pelvic pathology.   Other Result Information  This result has an attachment that is not available.  Result Narrative  CT ABDOMEN AND PELVIS WO CONTRAST - 03/12/2019    Test: Diagnostics - Cardiology - Echo TTE  Date: 03/05/2019 12:00 AM  1. Normal left ventricular size with moderate concentric left ventricular hypertrophy. EF 65%  2. grade 2 pseudo-normal diastolic LV dysfunction is present.  3. Overall left ventricular systolic function is grossly normal.  4. Mildly enlarged left atrium.  5. The aortic valve is structurally and functionally normal.  6. Mild thickened and/or calcified mitral leaflets.  7. There is trace  mitral regurgitation.  8. Mild tricuspid insufficiency with normal estimated right heart pressure. 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   -Asymptomatic Covid per protocol  -HD        No new Assessment & Plan notes have been filed under this hospital service since the last note was generated.  Service: Kidney Transplant    - I discussed general risks of abdominal surgery/kidney transplant with the patient and his son including but not limited to: bleeding (including massive hemorrhage), infection, damage to the transplanted kidney, damage to his blood vessels, abdominal or other organs, or nerves (including nerve damage, weakness or loss of function in lower extremitie[s]), acute or subacute rejection of transplanted organ, contracting cancer from the donor organ(s), possibility of delayed or slow graft function requiring ongoing dialysis. Discussed that all of these risk may result in anything between a very minor and easily correctable complication on down the continuum to major complications leading to minor or major debilitation or death. Also discussed the risks of blood transfusion: low but present risk of infection or transfusion reaction resulting in minor or major complications or reactions and even death.    Code Status: FULL    Duwayne Heck, NP  05/20/2019

## 2019-05-20 NOTE — Assessment & Plan Note (Deleted)
ESRD due to FSGS (Bx proven in 09/2007) and HTN.  Been on HD since 2010.  Normally gets dialysis MWFs      Possible DDRT today.    Plan for HD today before possible OR time.

## 2019-05-20 NOTE — Assessment & Plan Note (Addendum)
Hgb at target currently.  No need for EPO.   Monitor daily.

## 2019-05-20 NOTE — Brief Op Note (Signed)
Brief Operative Note    Surgeon(s) and Role:     Monico Blitz. 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  Recipient: cPRA 10.5%%; ESRD 2/2 FSGS  Induction: Thymoglobulin  Serologies: EBV+, CMV+, Toxo+  High risk: No     Backtable: Right kidney; Conventional anatomy - 1 vein/1 artery/1 ureter; Vein extension  Inflow: Right, External Iliac Artery  Outflow: Right, External Iliac Vein  Drainage: Ureteroneocystostomy  Stent: Yes     CIT: 23 hours 45 min  WIT: 41 min    Estimated Blood Loss: 100  Fluids: 3600 mL crystalloid  UOP 10 mL in 30 minutes post-reperfusion    Wound Class:  2 - Clean/Contaminated (GI, Biliary, Resp, GU tracts entered without spillage)    Incisional Closure Type: Primary closure (all tissues closed tightly or loosely, drains/hardware may exit)    Implants:   Implant Name Type Inv. Item Serial No. Manufacturer Lot No. LRB No. Used Action   STENT URETHRAL 6FR X 16CM DOUBLE J   [id number] - [id number] Stents-Non Des STENT URETHRAL 6FR X 16CM DOUBLE J   [id number]   [id number] Right 1 Implanted        Specimens:   Specimens (From admission, onward)    None           Drains:   Foley    Disposition and Plan:   PACU then floor    Postoperative Instructions:  Stat CBC/BMP in PACU  IVF@50cc /hr  PO pain meds  Lasix gtt @ 10mg /hr  Full thymoglobulin  Steroid Maintenance  Postop check in 4 hours

## 2019-05-20 NOTE — Assessment & Plan Note (Addendum)
Prior to transplant on minoxidil 10mg  and clonidine 0.1 PRN.   BP now controlled     Continue amlodipine to 10mg .   Carvedilol 6.25mg  BID if needed.

## 2019-05-20 NOTE — Consults (Signed)
KIDNEY TRANSPLANT INITIAL CONSULT NOTE     Consult requested by Dr. Guilford ShiStock of the Transplant surgery service for evaluation of patient awaiting kidney transplant and assessment of need for dialysis.          Identification  Cody Myers is a 43 y.o. male admitted for possible DDRT.    History of Present Illness  Patient is a 43yo male with PMH of ESRD due to FSGS (bx proven in 09/2007), HTN, and blindness (due to severe conjunctivitis) who is in the hospital for possible DDRT.  Been feeling well.  No recent hospitalizations or sickness.  Legally blind since 2009 but son helps out with daily activity.  Works out with his children almost daily (weight training and walks treadmills for 15 - 30min).  No trouble breathing or chest pain with activity.  No issues with HD but been using midodrine at times due to hypotension after HD but not all the time.  Last dialysis was on Monday.    Past Medical History:   Diagnosis Date    Asthma     Inhalers    Back pain     Blood transfusion without reported diagnosis     BMI 35.0-35.9,adult     Height 6 foot even - weight 260 pounds for BMI 36.      Chronic kidney disease 2009    ESRD due FSGS / DM.    Need to clarify FSGS by biopsy.  Noted in evaluation with [facility] but no further documentation of FSGS by Nephrology.     Depression     not taking medication    GERD (gastroesophageal reflux disease)     Gout     Heart murmur     Hypercholesterolemia     Hypertension     2018 BP dropping on dialysis - MD ordered Midodrine 5 mg for SBD <100    Legally blind 2010    Conjunctiva disorder with prolapse right eye.  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    MRSA (methicillin resistant Staphylococcus aureus)     Sleep apnea     Thromboembolism (CMS code)     Dialysis graft only     Past Surgical History:   Procedure Laterality Date    AV FISTULA PLACEMENT Right 2016    BACK SURGERY      Bilateral Eye Surgery      CENTRAL VENOUS CATHETER  2015    Place and removed once  fistula working    COLONOSCOPY  03/2018    Negative for polyps - no specimens taken    DIALYSIS FISTULA CREATION Left 2010    Revision 2014    TOOTH EXTRACTION  2018    Wound Vac  2015    Dehiscence anterior torso - chest       Allergies: Aspirin and Lactose    Medications Prior to Admission   Medication Sig    albuterol 90 mcg/actuation metered dose inhaler Inhale 2 puffs into the lungs    midodrine (PROAMATINE) 5 mg tablet Take 10 mg by mouth 3 (three) times a week on Mondays, Wednesdays, and Fridays On dialysis days only.      MINOXIDIL ORAL Take 10 mg by mouth daily       oxyCODONE-acetaminophen (PERCOCET) 10-325 mg tablet Take 1 tablet by mouth every 4 (four) hours as needed for Pain    sucroferric oxyhydroxide (VELPHORO ORAL) Take by mouth    clonidine HCl (CLONIDINE ORAL) Take 0.1 mg by mouth daily  as needed       omeprazole (PRILOSEC) 40 mg capsule Take 40 mg by mouth daily    predniSONE (DELTASONE) 5 mg tablet Take 5 mg by mouth daily       Social History     Socioeconomic History    Marital status: Single     Spouse name: Not on file    Number of children: 4    Years of education: 12    Highest education level: Associate degree: occupational, Scientist, product/process development, or vocational program   Occupational History    Occupation: Disabled Naval architect   Tobacco Use    Smoking status: Former Smoker     Packs/day: 1.00     Years: 4.00     Pack years: 4.00     Types: Cigarettes     Quit date: 06/19/2007     Years since quitting: 11.9    Smokeless tobacco: Never Used   Substance and Sexual Activity    Alcohol use: Not Currently    Drug use: Not Currently    Sexual activity: Not Currently       Family History   Problem Relation Name Age of Onset    Diabetes Mother      Heart disease Mother      Hypertension Mother      Diabetes Father      Heart disease Father      Hypertension Father         Review of Systems  A complete review of systems was performed. Pertinent positives as below:  General: No fever  or chills. No malaise. No weight changes.   Cardiovascular: No chest pain or palpatations.  Pulmonary: No coughing. No shortness of breath.  Gastrointestinal: No nausea or vomiting.  Genitourinary: No hematuria.  Musculoskeletal: No myalgias.  Neurologic: No focal weakness, numbness, or tingling.  Skin: No rashes.  Endocrine: No polyuria or polydipsia.  All other systems reviewed and are negative.    Vitals  Temp:  [36.5 C (97.7 F)-36.6 C (97.9 F)] 36.5 C (97.7 F)  Heart Rate:  [55-66] 55  *Resp:  [18-20] 20  BP: (130-167)/(82-109) 130/82  SpO2:  [100 %] 100 %      Intake/Output Summary (Last 24 hours) at 05/20/2019 0827  Last data filed at 05/20/2019 0800  Gross per 24 hour   Intake 200 ml   Output 600 ml   Net -400 ml       Physical Exam  General: Chronically ill appearing. No acute distress.  HENT: Normocephalic and atraumatic.  Eye: Blind  Chest: Good inspiratory effort. Lungs clear to auscultation without use of accessory muscles.  Heart: Regular rate and rhythm. No rubs, murmurs or gallops.  Abdomen: Nontender, nondistended without hepatosplenomegaly or masses.  Musculoskeletal: Normal gait, tone, and mass. No clubbing or cyanosis.  Skin: No icterus, lesions, rashes or skin breakdown.  Psychiatric: Normal mood and affect.  Neurological: Cranial nerves grossly intact. Alert and oriented x 3. No asterixis. No tremor.  Vascular: RUE AVF for access    Data    CBC        05/20/19  0418   WBC 4.3   HGB 12.1*   HCT 38.4*   PLT 223   MCV 88       Chem7        05/20/19  0418   NA 140   K 4.5   CL 94*   CO2 28   BUN 72*   CREAT 14.70*   GLU  86       Electrolytes        05/20/19  0418   CA 8.0*   MG 2.3   PO4 5.1*       Blood Gas  No results found in last 72 hours    Liver Panel        05/20/19  0418   AST 9   ALT 7*   ALKP 80   TBILI 0.7   DBILI 0.2   TP 7.7   ALB 3.9   GGT 13       Coags        05/20/19  0418   PTT 26.5   INR 1.1       Immunosuppression  No results found in last 72  hours    Urinalysis  @LASTLABCURRENC (GUA:1,BIUA:1,KEUA:1,SGUA:1,HBUA:1,PHUA:1,PRUA:1,NIUA:1,WEUA:1,WCU:1,RCU:1,SQEU:1,CAST:1)@      Radiology Results   Xr Chest 2 Views Pa And Lateral    Result Date: 05/20/2019  FINDINGS/IMPRESSION: Lungs clear. No pleural effusion or pneumothorax. Cardiomediastinal contour unremarkable. Report dictated by: Otho Bellows, MD MS, signed by: Otho Bellows, MD MS Department of Radiology and Biomedical Imaging        Patient Active Problem List    Diagnosis Date Noted    ESRD (end stage renal disease) (CMS code) 12/02/2018     Priority: 01.    Anemia of chronic renal failure 05/20/2019    Chronic kidney disease-mineral and bone disorder 05/20/2019    ESRD (end stage renal disease) on dialysis (CMS code) 05/16/2019    Pre-transplant evaluation for kidney transplant 04/14/2019     ESRD d/t: Focal Glomerular Sclerosis (Focal Segmental - Fsg), Hypertensive Nephrosclerosis    Listing Date: 12/30/2018  Accumulated List Date: 09/10/2008  Blood Type: AB  UNOS Points: 10.6  cPRA: 10.581%  (as of 12/17/2018) 32mm (national): 1% (local): 0%  EPTS: 24%  Days since Last Serum: 116 SA Serum Past Due  Days since Last cPRA: 116      At increased risk for cardiovascular disease 04/12/2019     MYOCARDIAL PERFUSION SCAN (11/20/2018)  1. The pharmacological perfusion study is negative for ischemia.  2. There is not previous study available.  3. The global LV systolic function is normal.  4. Further evaluation with medical management is necessary at this time.  **Post-stress gated SPECT images demonstrated normal contractility with a calculated LVEF of 61%    ECHO TTE (03/05/2019)  1. Normal left ventricular size with moderate concentric left ventricular hypertrophy. EF 65%  2. grade 2 pseudo-normal diastolic LV dysfunction is present.  3. Overall left ventricular systolic function is grossly normal.  4. Mildly enlarged left atrium.  5. The aortic valve is structurally and functionally normal.  6. Mild  thickened and/or calcified mitral leaflets.  7. There is trace mitral regurgitation.  8. Mild tricuspid insufficiency with normal estimated right heart pressure. RVSP 19.00 mmHg      EKG (12/23/2018)  Sinus rhythm  Consider inferior infarct    HOLTER MONITOR (10/22/2018)  IMPRESSION:      This  24-hour  Holter  monitor  is  suggestive of  sinus  rhythm with  sinus tachycardia.      There were  PVCs and PACs. There were no  sustained  arrhythmias seen.              Health care maintenance 04/12/2019     COLONOSCOPY (04/02/2018)  Melanosis in the colon.  No specimens collected.  FSGS (focal segmental glomerulosclerosis) 12/02/2018     BIOPSY KIDNEY (10/01/2007)  Focal Segmental Glomerulosclerosis  Patchy interstitial fibrosis and "thyroidzation type of tubular atrophy          HTN (hypertension) 12/02/2018    Legally blind 12/02/2018       Assessment and Recommendations    ESRD (end stage renal disease) (CMS code)  Assessment & Plan  ESRD due to FSGS (Bx proven in 09/2007) and HTN.  Been on HD since 2010.  Normally gets dialysis [facility]      Possible DDRT today.    Plan for HD today before possible OR time.    Chronic kidney disease-mineral and bone disorder  Assessment & Plan  Will monitor calcium and phos daily.    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    Anemia of chronic renal failure  Assessment & Plan  Hgb at target currently.    Monitor daily.    Legally blind  Assessment & Plan  Has son helping out for care.    HTN (hypertension)  Assessment & Plan  BP on the higher side.    Will try to get UF to get BP better before OR.      Code Status: FULL     Tomasita Morrow, DO  05/20/2019

## 2019-05-20 NOTE — Anesthesia Procedure Notes (Signed)
Airway  Date/Time: 05/20/2019 7:09 PM  Urgency: elective    Airway Highlights  Difficult Airway: No  Final Airway Type: endotracheal airway   Mask Difficulty Assessment (1-4): 1 - vent by mask  Final Endotracheal Airway: ETT - single   Cuffed: Yes  Cormack-Lehane Classification (DL): grade I - full view of glottis   Technique Used for Successful Placement: video laryngoscopy - GlideScope  Devices/Methods Used in Placement:   Insertion Site: oral  Blade Type:   Blade Size: 3   ETT Size (mm):   Number of Attempts at Approach: 1   Number of Other Approaches Attempted: 0   Unsuccessful Airways Attempted:   Unsuccessful Approaches for ETT:     Airway not difficult    General Information and Staff    Patient location during procedure: OR  See Anesthesia Record of the Associated Case for Staffing Details  Resident/CRNA: Mora Bellman, CRNA    Consent for Airway  See Anesthesia Record of the Associated Case for Documentation of Consent       Indications and Patient Condition  Indications for airway management: anesthesia  Spontaneous ventilation: present  Sedation level: anesthesia  Preoxygenated: yes  Patient position: neck neutral  MILS not maintained throughout  Mask difficulty assessment: 1 - vent by mask    Final Airway Details  Final airway type: endotracheal airway      Successful airway: ETT - single  Cuffed: yes   Successful intubation technique: video laryngoscopy - GlideScope  Endotracheal tube insertion site: oral  Blade size: #3  Cormack-Lehane Classification (DL): grade I - full view of glottis  Placement verified by: chest auscultation and capnometry   Inital cuff pressure (cm H2O): 26  Measured from: gums  ETT Depth (cm): 23  Number of attempts at approach: 1  Number of other approaches attempted: 0

## 2019-05-20 NOTE — Plan of Care (Signed)
Problem: Electrolyte Imbalance - Apheresis Hemodialysis Patient - Adult  Goal: Dialyzed with appropriate bath based on lab values  Description: Dialyzed with 3K bath for K of 4.5  Note: Dialyzed with 3K bath for K of 4.5

## 2019-05-20 NOTE — Anesthesia Pre-Procedure Evaluation (Addendum)
[facility]  Anesthesia Preprocedure Evaluation    Procedure Information     Case: [id number] Date/Time: 05/20/19 1658    Procedure: KIDNEY TRANSPLANT CADAVERIC (N/A Abdomen) - UNOS: [id number]  MATCH: [id number]  DNW CO: Cody Myers [phone]  (u/s) KIDNEY TX booked by Cody Myers [phone]  for Dr. Guilford Myers  Request OR for 10am    Diagnosis: ESRD (end stage renal disease) (CMS code) [N18.6]    Pre-op diagnosis: ESRD (end stage renal disease) (CMS code) [N18.6]    Location: [facility] OR 21 / [facility]    Surgeons: Lubertha Basque. Stock, MD          Precautions          None          Relevant Problems   ANESTHESIA   (+) At increased risk for cardiovascular disease       Previous Anesthesia Records Displaying the 5 most recent records    Date   Procedure Department Responsible Provider Complications Comp/Issues @ Handoff Comp/Issues @ Post Op    05/17/19  KIDNEY TRANSPLANT CADAVERIC (N/A Abdomen) PERIOP [facility]        12/23/18  See report for details SURGERY [facility] -  - -    07/04/18  See report for details SURGERY [facility] -  - -    04/02/18  See report for details ENDOSCOPY [facility] -  - -    02/15/17  See report for details SURGERY [facility] -  - -       View all records in Chart Review            Anesthesia Encounter History        CC/HPI/Past Medical History Summary: Cody Myers is a 43 y.o. 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     PMH:  # HTN  # GERD - on omeprazole  # chronic back pain: on oxycodone 10    Etiology of renal failure: FSGS and HTN  Mode of dialysis: HD via R AVF (prior use of L AVF and graft)  Dialysis regimen: MWF  Last dialysis: 11/27 AM, initiated dialysis: 2010  Estimated native UO: none     (Please refer to APeX Allergies, Problems, Past Medical History, Past Surgical History, Social History, and Family History activities, Results for current data from these respective sections of the chart; these sections of the chart are also summarized in reports, including the Patient Summary Extracts found in Chart Review)      Summary of Outside Records:    myocardial perfusion scan 11/20/18 w/ no evidence of ischemia  TTE 03/05/19 w/ EF 65% without any additional structural abnormalities.    Date:    03/05/2019 12:00 AM  1. Normal left ventricular size with moderate concentric left ventricular hypertrophy. EF 65%  2. grade 2 pseudo-normal diastolic LV dysfunction is present.  3. Overall left ventricular systolic function is grossly normal.  4. Mildly enlarged left atrium.  5. The aortic valve is structurally and functionally normal.  6. Mild thickened and/or calcified mitral leaflets.  7. There is trace mitral regurgitation.  8. Mild tricuspid insufficiency with normal estimated right heart pressure. RVSP 19.00 mmHg    Summary of Prior Anesthetics: Previous anesthetic without AAC          Review of Systems   Respiratory: Negative.    Cardiovascular: Negative.    Genitourinary: Positive for decreased urine  volume.        Anuric   All other systems reviewed and are negative.      Physical Exam    Airway:   Modified Mallampati score: II. Thyromental distance: > 6.5 cm. Mouth opening: good. Neck range of motion: full.   Constitutional:       Appearance: He is well-developed and well-nourished.   HENT:      Mouth/Throat:      Dentition: None present.   Cardiovascular:      Rate and Rhythm: Normal rate and regular rhythm.   Pulmonary:      Effort: Pulmonary effort is normal.   Neurological:      General: No focal deficit present.      Mental Status: He is alert and oriented to person, place, and time.     Dental: Patient is edentulous.         Prepare (Pre-Operative Clinic) Assessment/Plan/Narrative       Stop [name] information      Anesthesia Assessment and Plan  Day Of Surgery Provider Chart Review:  NPO status verified  Medications reviewed  Allergies reviewed  Problem list reviewed  Anesthesia history reviewed  Pertinent labs reviewed  Consults reviewed    ASA 2       Anesthesia Plan  Anesthesia Type: general  Induction Technique: IntraVenous  Invasive Monitors/Vascular Access: None  Airway Plan: oral ET tube  Possible Advanced Airway Techniques: None  Other Techniques: None  Planned Recovery Location: PACU    Blood Product Preparation  Blood Products Plan: Reviewed peri-op blood report  Consent: reviewed consent for blood transfusion    Anesthesia Potential Complication Discussion  At increased or higher than average risk of: Post-op pulmonary complication, Post-op major adverse cardiac event, Aspiration, Difficult airway, Post-operative intubation, Unanticipated hospitalization or ICU level care, Emergence agitation, Post-operative nausea and vomiting, Myocardial dysfunction, Cardiac rhythm disturbance and Allergic reaction  There is the possibility of rare but serious complications.    Informed Consent for Anesthesia  Consent obtained from patient    Risks, benefits and alternatives including those of invasive monitoring discussed. Increased risks (as above) discussed.  Questions invited and all answered.  Interpreter: N/A - patient/guardian's preferred language is [place].  Consent granted for anesthetic plan    Quality Measure Documentation   Opioid Therapy Planned? Yes    (See Anesthesia Record for attending attestation)    [Please note, smart link data included in this note may not reflect changes since note creation. Please see appropriate section of APeX for up-to-the minute information.]

## 2019-05-20 NOTE — H&P (Signed)
43 y/o with ESRD on HD - no current infections or contraindications to transplant.   DOCUMENTATION OF INFORMED CONSENT     I have seen and examined the patient and explained the purpose, benefits, and alternatives  of proceeding with kidney transplantation in general and using this particular donor and attendant risks which include, but are not limited to, transmission of infectious and neoplastic disease, significant bleeding, postoperative infection, possible reoperation, delayed graft function, primary non function, graft failure, ureteral complications,  Lymphocele, rejection, cardiac events, and even death. The patient and family understand and wish to proceed.     I have confirmed the ABO type of the donor and recipient.    I have reviewed the crossmatch results, and antibody status of the recipient vs. this particular donor.    I have reviewed the serologic testing of the donor.    I have explained to the patient that the donor  is not considered a PHS increased risk donor, and why the donor is designated as such.    After a thorough discussion of the above, informed consent was obtained.    Induction with thymo.  OR pending crossmatch

## 2019-05-20 NOTE — Interdisciplinary (Signed)
Discharge Screening Note     Primary Diagnosis:  ESRD   Surgery/Procedures/Therapies:  Plan for DDRT today 05/20/2019  Current Functional Status:  Independent - legally blind since 2009, otherwise very physically fit (worksout every day)  Rehabilitation Evaluation Ordered:  no  Rehabilitation Evaluation Recommendation: n/a  Estimated D/C Date:  05/24/2019  Barriers to Discharge:  Medical clearance   Discharge Plan:  Home with outpatient MD follow-up.   Identified Case Management Interventions: No discharge needs anticipated at this time.   CM Assessment Completed: N/A     CM will continue to follow for any discharge needs.     Earnest Rosier, RN CM  [facility] & Urology Case Manager  Phone:  [phone]  05/20/2019  8:59 AM

## 2019-05-20 NOTE — Nursing Note (Signed)
Pre Treatment Nursing Note  Pre treatment report received from 9L, RN  Pre treatment vitals: Wt 120.2 kg , BP  167/104, P 61.  3 hours HD started in Pt Room via R-AVF QB 400 ml/min, ordered UF 1 L as tolerated. NS prime.    Medication Administration:  Aranesp: 0 mcg IVP  Zemplar: 0 mcg IVP  Blood Transfusion: No   After HD MAR reviewed and After HD MAR: no medications due after HD    Post Treatment Nursing Note  Post tx vitals: Wt -2 kg, BP 150/89, P 62.   Pt tolerated 3 hr treatment with net UF 2 L fluid removed. AVF, hemostasis 15 minutes, +thrill,+bruit, yes lidocaine Treatment orders were adjusted during the run.   There were no adverse reactions/complications during this procedure. Post treatment report given to: Joaquim Nam, RN

## 2019-05-20 NOTE — Consults (Signed)
Name: Cody Myers             MRN:  [id number]   Transplant Date: 05/20/2019 (DDRT)     PMH  Past Medical History:   Diagnosis Date    Asthma     Inhalers    Back pain     Blood transfusion without reported diagnosis     BMI 35.0-35.9,adult     Height 6 foot even - weight 260 pounds for BMI 36.      Chronic kidney disease 2009    ESRD due FSGS / DM.    Need to clarify FSGS by biopsy.  Noted in evaluation with [facility] but no further documentation of FSGS by Nephrology.     Depression     not taking medication    GERD (gastroesophageal reflux disease)     Gout     Heart murmur     Hypercholesterolemia     Hypertension     2018 BP dropping on dialysis - MD ordered Midodrine 5 mg for SBD <100    Legally blind 2010    Conjunctiva disorder with prolapse right eye.  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 Unacceptables:()    Tested Date 12/24/2018 12/17/2018         Transplant Serology Workup:  Recipient  Lab Results   Component Value Date    HBCAB NEG 12/17/2018    HBSAG NEG 12/17/2018    HBABQ >800 12/17/2018    HCV NEG 12/17/2018    RPR Nonreactive 12/17/2018    CMVAB POS (A) 12/17/2018    VCAM NEG 12/17/2018    EBNA POS (A) 12/17/2018    TOXO NEG 12/17/2018         Donor   Cadaver Donor UNOS ID (no units)   Date Value   05/20/2019 [id number]     Match ID: [id number]        Medications Prior to Admission   Medication Sig Dispense Refill    albuterol 90 mcg/actuation metered dose inhaler Inhale 2 puffs into the lungs       midodrine (PROAMATINE) 5 mg tablet Take 10 mg by mouth 3 (three) times a week on Mondays, Wednesdays, and Fridays On dialysis days only.        MINOXIDIL ORAL Take 10 mg by mouth daily         oxyCODONE-acetaminophen (PERCOCET) 10-325 mg tablet Take 1 tablet by mouth every 4 (four) hours as needed for Pain      sucroferric oxyhydroxide (VELPHORO ORAL) Take by mouth      clonidine HCl (CLONIDINE ORAL) Take 0.1 mg by mouth daily as needed         omeprazole (PRILOSEC) 40 mg capsule Take 40 mg by mouth daily  predniSONE (DELTASONE) 5 mg tablet Take 5 mg by mouth daily         Induction Plan:   Thymo-full ($RemoveBefo'6mg'jbQPHQvgqMA$ /kg) + steroid maintenance     Plan for PPX:   Toxo prophylaxis: (D +/R - Toxo MISMATCH), Septra DS 1 tab PO Daily x 1 month (06/19/2019), then qMWF x 11 months more (05/19/2020) -- 1 year total for Toxo Mismatch   PCP prophylaxis: Septra DS as per above for Toxo ppx   CMV prophylaxis: (D +/R +), Valcyte 900 mg PO Daily (adjusted for renal function) x 3 months (08/18/2019)   Fungal prophylaxis: Fluconazole 100 mg PO q7days x 1 month (06/18/2019)  HBV prophylaxis: Not necessary    Other prophylaxis: None.     I have evaluated donor's serology and patient's medications/records.  Patient will be initiated on immunosuppressants as directed by the transplant team.  Patient's pre-transplant medications will be reinitiated as appropriate.  We will follow closely and make adjustments to medication therapy as indicated.   We will also perform medication education teaching throughout hospitalization and at discharge.    Larence Penning, PharmD  Pager: [phone]

## 2019-05-20 NOTE — Plan of Care (Signed)
Problem: Discharge Planning - Adult  Goal: Knowledge of and participation in plan of care  Outcome: Progress within 12 hours

## 2019-05-21 ENCOUNTER — Inpatient Hospital Stay: Admit: 2019-05-21 | Discharge: 2019-05-21 | Payer: MEDICARE

## 2019-05-21 DIAGNOSIS — I129 Hypertensive chronic kidney disease with stage 1 through stage 4 chronic kidney disease, or unspecified chronic kidney disease: Secondary | ICD-10-CM

## 2019-05-21 DIAGNOSIS — Z940 Kidney transplant status: Secondary | ICD-10-CM

## 2019-05-21 DIAGNOSIS — Z5181 Encounter for therapeutic drug level monitoring: Secondary | ICD-10-CM

## 2019-05-21 DIAGNOSIS — Z992 Dependence on renal dialysis: Secondary | ICD-10-CM

## 2019-05-21 DIAGNOSIS — Z4822 Encounter for aftercare following kidney transplant: Secondary | ICD-10-CM

## 2019-05-21 DIAGNOSIS — R9431 Abnormal electrocardiogram [ECG] [EKG]: Secondary | ICD-10-CM

## 2019-05-21 LAB — COMPLETE BLOOD COUNT
Hematocrit: 43 % (ref 41.0–53.0)
Hemoglobin: 13.1 g/dL — ABNORMAL LOW (ref 13.6–17.5)
MCH: 27.5 pg (ref 26.0–34.0)
MCHC: 30.5 g/dL — ABNORMAL LOW (ref 31.0–36.0)
MCV: 90 fL (ref 80–100)
Platelet Count: 146 10*9/L (ref 140–450)
RBC Count: 4.76 10*12/L (ref 4.40–5.90)
WBC Count: 3 10*9/L — ABNORMAL LOW (ref 3.4–10.0)

## 2019-05-21 LAB — COMPLETE BLOOD COUNT WITH DIFF
Abs Basophils: 0.01 10*9/L (ref 0.00–0.10)
Abs Eosinophils: 0 10*9/L (ref 0.00–0.40)
Abs Imm Granulocytes: 0.04 10*9/L (ref <0.10)
Abs Lymphocytes: 0.04 10*9/L — ABNORMAL LOW (ref 1.00–3.40)
Abs Monocytes: 0.43 10*9/L (ref 0.20–0.80)
Abs Neutrophils: 7.7 10*9/L — ABNORMAL HIGH (ref 1.80–6.80)
Hematocrit: 41.5 % (ref 41.0–53.0)
Hemoglobin: 13.1 g/dL — ABNORMAL LOW (ref 13.6–17.5)
MCH: 28.2 pg (ref 26.0–34.0)
MCHC: 31.6 g/dL (ref 31.0–36.0)
MCV: 89 fL (ref 80–100)
Platelet Count: 169 10*9/L (ref 140–450)
RBC Count: 4.64 10*12/L (ref 4.40–5.90)
WBC Count: 8.2 10*9/L (ref 3.4–10.0)

## 2019-05-21 LAB — BASIC METABOLIC PANEL (NA, K,
Anion Gap: 19 — ABNORMAL HIGH (ref 4–14)
Anion Gap: 17 — ABNORMAL HIGH (ref 4–14)
Calcium, total, Serum / Plasma: 7.6 mg/dL — ABNORMAL LOW (ref 8.4–10.5)
Calcium, total, Serum / Plasma: 7.5 mg/dL — ABNORMAL LOW (ref 8.4–10.5)
Carbon Dioxide, Total: 24 mmol/L (ref 22–29)
Carbon Dioxide, Total: 23 mmol/L (ref 22–29)
Chloride, Serum / Plasma: 98 mmol/L — ABNORMAL LOW (ref 101–110)
Chloride, Serum / Plasma: 97 mmol/L — ABNORMAL LOW (ref 101–110)
Creatinine: 10.92 mg/dL — ABNORMAL HIGH (ref 0.73–1.24)
Creatinine: 11.72 mg/dL — ABNORMAL HIGH (ref 0.73–1.24)
Glucose, non-fasting: 106 mg/dL (ref 70–199)
Glucose, non-fasting: 123 mg/dL (ref 70–199)
Potassium, Serum / Plasma: 4.9 mmol/L (ref 3.5–5.0)
Potassium, Serum / Plasma: 6.4 mmol/L — ABNORMAL HIGH (ref 3.5–5.0)
Sodium, Serum / Plasma: 141 mmol/L (ref 135–145)
Sodium, Serum / Plasma: 137 mmol/L (ref 135–145)
Urea Nitrogen, Serum / Plasma: 45 mg/dL — ABNORMAL HIGH (ref 7–25)
Urea Nitrogen, Serum / Plasma: 48 mg/dL — ABNORMAL HIGH (ref 7–25)
eGFR - high estimate: 6 mL/min — ABNORMAL LOW (ref >59)
eGFR - high estimate: 5 mL/min — ABNORMAL LOW (ref >59)
eGFR - low estimate: 5 mL/min — ABNORMAL LOW (ref >59)
eGFR - low estimate: 5 mL/min — ABNORMAL LOW (ref >59)

## 2019-05-21 LAB — MAGNESIUM, SERUM / PLASMA
Magnesium, Serum / Plasma: 2 mg/dL (ref 1.6–2.6)
Magnesium, Serum / Plasma: 1.8 mg/dL (ref 1.6–2.6)

## 2019-05-21 LAB — MRSA CULTURE

## 2019-05-21 LAB — POCT GLUCOSE: Glucose, iSTAT: 113 mg/dL (ref 70–199)

## 2019-05-21 LAB — PHOSPHORUS, SERUM / PLASMA
Phosphorus, Serum / Plasma: 4.5 mg/dL (ref 2.3–4.7)
Phosphorus, Serum / Plasma: 4.4 mg/dL (ref 2.3–4.7)

## 2019-05-21 MED ORDER — VALGANCICLOVIR 450 MG TABLET
450 | ORAL | Status: DC
Start: 2019-05-21 — End: 2019-05-26
  Administered 2019-05-25: 17:00:00 450 mg via ORAL

## 2019-05-21 MED ORDER — LIDOCAINE 2 % MUCOSAL JELLY IN APPLICATOR
2 | Freq: Once | Status: AC
Start: 2019-05-21 — End: 2019-05-21
  Administered 2019-05-22: 08:00:00 10 mL via URETHRAL

## 2019-05-21 MED ORDER — DIPHENHYDRAMINE 50 MG/ML INJECTION SOLUTION
50 mg/mL | INTRAMUSCULAR | Status: AC
  Administered 2019-05-21: 21:00:00 via INTRAVENOUS

## 2019-05-21 MED ORDER — SODIUM CHLORIDE 0.9 % DIALYSIS CIRCUIT
0.9 | INTRAVENOUS | Status: DC | PRN
Start: 2019-05-21 — End: 2019-05-21
  Administered 2019-05-21: 19:00:00 via INTRAVENOUS

## 2019-05-21 MED ORDER — DOCUSATE SODIUM 250 MG CAPSULE
250 | Freq: Two times a day (BID) | ORAL | Status: DC
Start: 2019-05-21 — End: 2019-05-26
  Administered 2019-05-22 – 2019-05-26 (×8): via ORAL

## 2019-05-21 MED ORDER — ACETAMINOPHEN 1,000 MG/100 ML (10 MG/ML) INTRAVENOUS SOLUTION (ONCE)
1000 | Freq: Once | INTRAVENOUS | Status: AC
Start: 2019-05-21 — End: 2019-05-21
  Administered 2019-05-21: 08:00:00 via INTRAVENOUS

## 2019-05-21 MED ORDER — DEXTROSE 50 % IN WATER (D50W) INTRAVENOUS SYRINGE: 50% | INTRAVENOUS | Status: DC

## 2019-05-21 MED ORDER — FLUCONAZOLE 100 MG TABLET
100 | ORAL_TABLET | ORAL | 0 refills | 21.00000 days | Status: AC
Start: 2019-05-21 — End: 2019-08-19

## 2019-05-21 MED ORDER — TACROLIMUS 1 MG CAPSULE, IMMEDIATE-RELEASE
1 mg | ORAL_CAPSULE | Freq: Two times a day (BID) | ORAL | 11 refills | Status: DC
Start: 2019-05-21 — End: 2019-05-22

## 2019-05-21 MED ORDER — SODIUM CHLORIDE 0.9 % INTRAVENOUS SOLUTION
0.9 % | Freq: Once | INTRAVENOUS | Status: DC
Start: 2019-05-21 — End: 2019-05-21

## 2019-05-21 MED ORDER — FLUCONAZOLE 100 MG TABLET
100 | ORAL | Status: DC
Start: 2019-05-21 — End: 2019-05-26
  Administered 2019-05-21: 21:00:00 100 mg via ORAL

## 2019-05-21 MED ORDER — MYCOPHENOLATE MOFETIL 250 MG CAPSULE
250 | ORAL_CAPSULE | Freq: Two times a day (BID) | ORAL | 11 refills | 30.00000 days | Status: AC
Start: 2019-05-21 — End: 2019-07-14

## 2019-05-21 MED ORDER — ACETAMINOPHEN 500 MG TABLET
500 | ORAL_TABLET | Freq: Three times a day (TID) | ORAL | 0 refills | Status: AC | PRN
Start: 2019-05-21 — End: ?

## 2019-05-21 MED ORDER — SODIUM CHLORIDE 0.9 % DIALYSIS CIRCUIT
0.9 | Freq: Once | INTRAVENOUS | Status: AC
Start: 2019-05-21 — End: 2019-05-21
  Administered 2019-05-21: 21:00:00 via INTRAVENOUS

## 2019-05-21 MED ORDER — INSULIN U-100 REGULAR HUMAN 100 UNIT/ML INJECTION SOLUTION: 100 unit/mL | INTRAMUSCULAR | Status: DC

## 2019-05-21 MED ORDER — VALGANCICLOVIR 450 MG TABLET
450 | ORAL_TABLET | Freq: Every day | ORAL | 2 refills | Status: DC
Start: 2019-05-21 — End: 2019-05-22

## 2019-05-21 MED ORDER — SENNOSIDES 8.6 MG TABLET
8.6 | ORAL_TABLET | Freq: Every evening | ORAL | 0 refills | Status: AC | PRN
Start: 2019-05-21 — End: ?

## 2019-05-21 MED ORDER — HYDROMORPHONE (PF) 0.5 MG/0.5 ML INJECTION SYRINGE
0.5 | INTRAMUSCULAR | Status: DC | PRN
Start: 2019-05-21 — End: 2019-05-22
  Administered 2019-05-21 (×2): via INTRAVENOUS

## 2019-05-21 MED ORDER — OXYCODONE 5 MG TABLET
5 | ORAL | Status: DC | PRN
Start: 2019-05-21 — End: 2019-05-21
  Administered 2019-05-21: 10:00:00 via ORAL

## 2019-05-21 MED ORDER — SODIUM CHLORIDE 0.9 % DIALYSIS CIRCUIT
0.9 | Freq: Once | INTRAVENOUS | Status: AC
Start: 2019-05-21 — End: 2019-05-21
  Administered 2019-05-21: 17:00:00 via INTRAVENOUS

## 2019-05-21 MED ORDER — METHYLPREDNISOLONE SOD SUCC (PF) 125 MG/2 ML SOLUTION FOR INJECTION
1252 | Freq: Once | INTRAMUSCULAR | Status: AC
Start: 2019-05-21 — End: 2019-05-21
  Administered 2019-05-21: 22:00:00 via INTRAVENOUS

## 2019-05-21 MED ORDER — HEPARIN (PORCINE) 1,000 UNIT/ML INJECTION SOLUTION
1,000 unit/mL | INTRAMUSCULAR | Status: AC
  Administered 2019-05-21: 23:00:00 via INTRAVENOUS

## 2019-05-21 MED ORDER — MYCOPHENOLATE MOFETIL 250 MG CAPSULE
250 | Freq: Two times a day (BID) | ORAL | Status: DC
Start: 2019-05-21 — End: 2019-05-24
  Administered 2019-05-21 – 2019-05-24 (×7): via ORAL

## 2019-05-21 MED ORDER — NEOMYCIN 40 MG-POLYMYXIN B 200,000 UNIT/ML GU IRRIGATION SOLUTION
40 | Status: DC | PRN
Start: 2019-05-21 — End: 2019-05-22

## 2019-05-21 MED ORDER — ACETAMINOPHEN 325 MG TABLET: 325 mg | ORAL | Status: DC

## 2019-05-21 MED ORDER — CIPROFLOXACIN 500 MG TABLET
500 | ORAL_TABLET | Freq: Two times a day (BID) | ORAL | 0 refills | Status: DC
Start: 2019-05-21 — End: 2019-07-14

## 2019-05-21 MED ORDER — CLONIDINE HCL 0.1 MG TABLET
0.1 | Freq: Once | ORAL | Status: AC
Start: 2019-05-21 — End: 2019-05-21
  Administered 2019-05-21: 10:00:00 via ORAL

## 2019-05-21 MED ORDER — OMEPRAZOLE 20 MG CAPSULE,DELAYED RELEASE
20 | ORAL_CAPSULE | Freq: Every day | ORAL | 2 refills | Status: DC
Start: 2019-05-21 — End: 2019-05-26

## 2019-05-21 MED ORDER — SULFAMETHOXAZOLE 800 MG-TRIMETHOPRIM 160 MG TABLET
800-160 | ORAL_TABLET | ORAL | 4 refills | Status: DC
Start: 2019-05-21 — End: 2019-07-14

## 2019-05-21 MED ORDER — SODIUM CHLORIDE 0.9 % INTRAVENOUS SOLUTION
0.9 | INTRAVENOUS | Status: DC
Start: 2019-05-21 — End: 2019-05-21

## 2019-05-21 MED ORDER — BELLADONNA ALKALOIDS-OPIUM 16.2 MG-30 MG RECTAL SUPPOSITORY
Freq: Once | RECTAL | Status: DC
Start: 2019-05-21 — End: 2019-05-21

## 2019-05-21 MED ORDER — LIDOCAINE 4 % TOPICAL CREAM
4 | Freq: Once | TOPICAL | Status: DC | PRN
Start: 2019-05-21 — End: 2019-05-21

## 2019-05-21 MED ORDER — ACETAMINOPHEN 325 MG TABLET
325 | Freq: Once | ORAL | Status: DC
Start: 2019-05-21 — End: 2019-05-21

## 2019-05-21 MED ORDER — DOCUSATE SODIUM 250 MG CAPSULE
250 | ORAL_CAPSULE | Freq: Two times a day (BID) | ORAL | 0 refills | Status: AC
Start: 2019-05-21 — End: ?

## 2019-05-21 MED ORDER — SENNOSIDES 8.6 MG TABLET
8.6 | Freq: Every day | ORAL | Status: DC
Start: 2019-05-21 — End: 2019-05-26
  Administered 2019-05-22: 04:00:00 17.2 mg via ORAL
  Administered 2019-05-23 – 2019-05-26 (×4): via ORAL

## 2019-05-21 MED ORDER — SULFAMETHOXAZOLE 800 MG-TRIMETHOPRIM 160 MG TABLET
800-160 | Freq: Every day | ORAL | Status: DC
Start: 2019-05-21 — End: 2019-05-26
  Administered 2019-05-22 – 2019-05-25 (×4): via ORAL
  Administered 2019-05-26: 22:00:00 1 via ORAL

## 2019-05-21 MED ORDER — LIDOCAINE 5 % TOPICAL PATCH
5 | Freq: Every day | TOPICAL | Status: DC | PRN
Start: 2019-05-21 — End: 2019-05-26

## 2019-05-21 MED ORDER — PREDNISONE 5 MG TABLET
5 | ORAL_TABLET | Freq: Every day | ORAL | 3 refills | Status: DC
Start: 2019-05-21 — End: 2019-05-26

## 2019-05-21 MED ORDER — DIPHENHYDRAMINE 25 MG CAPSULE
25 | Freq: Once | ORAL | Status: DC
Start: 2019-05-21 — End: 2019-05-21

## 2019-05-21 MED ORDER — PREDNISONE 10 MG TABLET
10 | ORAL_TABLET | ORAL | 0 refills | Status: DC
Start: 2019-05-21 — End: 2019-05-26

## 2019-05-21 MED ORDER — ACETAMINOPHEN 500 MG TABLET
500 | Freq: Three times a day (TID) | ORAL | Status: DC
Start: 2019-05-21 — End: 2019-05-26
  Administered 2019-05-21 – 2019-05-26 (×12): via ORAL

## 2019-05-21 MED ORDER — BELLADONNA ALKALOIDS-OPIUM 16.2 MG-60 MG RECTAL SUPPOSITORY
Freq: Three times a day (TID) | RECTAL | Status: DC | PRN
Start: 2019-05-21 — End: 2019-05-26

## 2019-05-21 MED ORDER — SODIUM CHLORIDE 0.9 % IV BOLUS
0.9 | INTRAVENOUS | Status: DC | PRN
Start: 2019-05-21 — End: 2019-05-21

## 2019-05-21 MED ORDER — FUROSEMIDE 10 MG/ML INJECTION SOLUTION
10 mg/mL | INTRAMUSCULAR | Status: DC
Start: 2019-05-21 — End: 2019-05-21
  Administered 2019-05-21: 09:00:00 via INTRAVENOUS

## 2019-05-21 MED ORDER — HYDROMORPHONE (PF) 0.5 MG/0.5 ML INJECTION SYRINGE
0.5 | INTRAMUSCULAR | Status: DC | PRN
Start: 2019-05-21 — End: 2019-05-21
  Administered 2019-05-21 (×4): via INTRAVENOUS

## 2019-05-21 MED ORDER — LANSOPRAZOLE 30 MG CAPSULE,DELAYED RELEASE
30 | Freq: Two times a day (BID) | ORAL | Status: DC
Start: 2019-05-21 — End: 2019-05-26
  Administered 2019-05-21 – 2019-05-26 (×11): via ORAL

## 2019-05-21 MED ORDER — LIDOCAINE (PF) 10 MG/ML (1 %) INJECTION SOLUTION
10 | INTRAMUSCULAR | Status: DC | PRN
Start: 2019-05-21 — End: 2019-05-21

## 2019-05-21 MED ORDER — DIPHENHYDRAMINE 25 MG CAPSULE: 25 mg | ORAL | Status: AC

## 2019-05-21 MED ORDER — DIPHENHYDRAMINE 50 MG/ML INJECTION SOLUTION
50 | Freq: Once | INTRAMUSCULAR | Status: DC
Start: 2019-05-21 — End: 2019-05-21

## 2019-05-21 MED ORDER — OXYCODONE 5 MG TABLET
5 | ORAL | Status: DC | PRN
Start: 2019-05-21 — End: 2019-05-26
  Administered 2019-05-22 – 2019-05-26 (×6): via ORAL

## 2019-05-21 MED FILL — DILAUDID (PF) 0.5 MG/0.5 ML INJECTION SYRINGE: 0.5 mg/ mL | INTRAMUSCULAR | Qty: 0.5

## 2019-05-21 MED FILL — ONDANSETRON HCL (PF) 4 MG/2 ML INJECTION SOLUTION: 4 mg/2 mL | INTRAMUSCULAR | Qty: 2

## 2019-05-21 MED FILL — ACETAMINOPHEN 500 MG TABLET: 500 mg | ORAL | Qty: 2

## 2019-05-21 MED FILL — DIPHENHYDRAMINE 50 MG/ML INJECTION SOLUTION: 50 mg/mL | INTRAMUSCULAR | Qty: 1

## 2019-05-21 MED FILL — FUROSEMIDE 10 MG/ML INJECTION SOLUTION: 10 mg/mL | INTRAMUSCULAR | Qty: 10

## 2019-05-21 MED FILL — MANNITOL 25 % INTRAVENOUS SOLUTION: 25 % | INTRAVENOUS | Qty: 100

## 2019-05-21 MED FILL — LANSOPRAZOLE 30 MG CAPSULE,DELAYED RELEASE: 30 mg | ORAL | Qty: 1

## 2019-05-21 MED FILL — CEFAZOLIN 1 GRAM SOLUTION FOR INJECTION: 1 gram | INTRAMUSCULAR | Qty: 1000

## 2019-05-21 MED FILL — FLUCONAZOLE 100 MG TABLET: 100 mg | ORAL | Qty: 1

## 2019-05-21 MED FILL — ANECREAM 4 % TOPICAL: 4 % | TOPICAL | Qty: 5

## 2019-05-21 MED FILL — MYCOPHENOLATE MOFETIL 250 MG CAPSULE: 250 mg | ORAL | Qty: 2

## 2019-05-21 MED FILL — NEOMYCIN 40 MG-POLYMYXIN B 200,000 UNIT/ML GU IRRIGATION SOLUTION: 40 mg-200,000 unit/mL | Qty: 1

## 2019-05-21 MED FILL — SOLU-MEDROL (PF) 125 MG/2 ML SOLUTION FOR INJECTION: 125 mg/2 mL | INTRAMUSCULAR | Qty: 4

## 2019-05-21 MED FILL — THYMOGLOBULIN 25 MG INTRAVENOUS SOLUTION: 25 mg | INTRAVENOUS | Qty: 35

## 2019-05-21 MED FILL — PAPAVERINE 30 MG/ML INJECTION SOLUTION: 30 mg/mL | INTRAMUSCULAR | Qty: 4

## 2019-05-21 MED FILL — HEPARIN (PORCINE) 1,000 UNIT/ML INJECTION SOLUTION: 1000 unit/mL | INTRAMUSCULAR | Qty: 10

## 2019-05-21 MED FILL — OXYCODONE 5 MG TABLET: 5 mg | ORAL | Qty: 2

## 2019-05-21 MED FILL — CLONIDINE HCL 0.1 MG TABLET: 0.1 mg | ORAL | Qty: 1

## 2019-05-21 MED FILL — FENTANYL (PF) 50 MCG/ML INJECTION SOLUTION: 50 mcg/mL | INTRAMUSCULAR | Qty: 2

## 2019-05-21 MED FILL — OFIRMEV 1,000 MG/100 ML (10 MG/ML) INTRAVENOUS SOLUTION: 1000 mg/100 mL (10 mg/mL) | INTRAVENOUS | Qty: 100

## 2019-05-21 NOTE — Assessment & Plan Note (Addendum)
Bactrim DS daily until 06/19/19 then MWFs until 05/19/20 (Toxo mismatch)  Valcyte renally dosed until 08/18/19  Fluconazole weekly until 06/18/19

## 2019-05-21 NOTE — Assessment & Plan Note (Signed)
Needed dialysis 05/21/19 after his transplant on 05/20/19 for hyperkalemia.

## 2019-05-21 NOTE — Plan of Care (Signed)
Problem: Discharge Planning - Adult  Goal: Knowledge of and participation in plan of care  Outcome: Progress within 12 hours     Problem: Activity Intolerance - Kidney Transplant Patient - Adult  Goal: Able to perform physical activity as ordered  Outcome: Progress within 12 hours  Goal: Improved activity tolerance ( return to admit level or baseline )  Outcome: Progress within 12 hours     Problem: Anxiety, Patient / Family / Caregiver - Kidney Transplant Patient - Adult  Goal: Alleviation of anxiety (Patient / Family / Caregiver)  Outcome: Progress within 12 hours  Goal: Expression of calm and reduction in anxiety symptoms  Outcome: Progress within 12 hours     Problem: Bleeding, at Risk or Actual - Kidney Transplant Patient - Adult  Goal: Absence of impaired coagulation signs and symptoms / active bleeding  Outcome: Progress within 12 hours     Problem: Body Temperature Imbalanced, at Risk or Actual - Kidney Transplant Patient - Adult  Goal: Body temperature within specified parameters  Outcome: Progress within 12 hours     Problem: Coping Ineffective, Patient / Family / Caregiver - Kidney Transplant Patient - Adult  Goal: Effective coping (Patient / Family / Caregiver)  Outcome: Progress within 12 hours     Problem: Fluid Volume, Imbalanced - Kidney Transplant Patient - Adult  Goal: Absence of fluid imbalance signs and symptoms  Outcome: Progress within 12 hours     Problem: Gas Exchange, Impaired- Kidney Transplant Patient - Adult  Goal: Adequate oxygenation (absence of signs and symptoms of hypoxemia)  Outcome: Progress within 12 hours  Goal: Adequate ventilation (absence of signs and symptoms of hypercarbia)  Outcome: Progress within 12 hours     Problem: GI System Impaired - Kidney Transplant Patient - Adult  Goal: Passage of soft, formed stool  Outcome: Progress within 12 hours  Goal: Stool elimination per clinical condition ( e.g. GI ostomies )  Outcome: Progress within 12 hours     Problem: GU  Elimination Impaired - Kidney Transplant Patient - Adult  Goal: Able to void spontaneously  Outcome: Progress within 12 hours  Goal: Able to perform urinary catheter care  Outcome: Progress within 12 hours     Problem: Infection, at Risk and Actual - Kidney Transplant Patient - Adult  Goal: Prevention of infection  Outcome: Progress within 12 hours  Goal: Resolution of Infection  Outcome: Progress within 12 hours     Problem: Nutrition, Alteration in - Kidney Transplant Patient - Adult  Goal: Adequate nutritional intake  Outcome: Progress within 12 hours  Goal: Maximize nutritional intake per patient condition  Outcome: Progress within 12 hours     Problem: Pain, Acute / Chronic - Kidney Transplant Patient - Adult  Goal: Involvement in pain management plan  Outcome: Progress within 12 hours     Problem: Skin Integrity, Impaired - Kidney Transplant Patient - Adult  Goal: Skin / incision / wound healing  Outcome: Progress within 12 hours     Problem: Transplant Organ Function Altered - Kidney Transplant Patient - Adult  Goal: Transplant organ function preserved  Outcome: Progress within 12 hours  Goal: Absence of rejection  Outcome: Progress within 12 hours  Goal: Resolution of rejection  Outcome: Progress within 12 hours

## 2019-05-21 NOTE — Nursing Note (Signed)
Pre Treatment Nursing Note  Pre treatment report received from [facility], [place]  Pre treatment vitals: Wt NA kg. bedscale not working. BP 158/78, Pulse 83.  3 hours HD started in AHU via R-AVF QB 400 ml/min, ordered UF 1-2 L as tolerated. NS prime.    Medication Administration:  Aranesp: 0 mcg IVP  Zemplar: 0 mcg IVP  Blood Transfusion: No   After HD MAR reviewed and After HD MAR: no medications due after HD    Post Treatment Nursing Note  Post tx vitals: Wt NA.  BP 134/72, Pulse 75.   Pt tolerated 3 hr treatment with net UF 1 L fluid removed. Post treatment R forearm AVF bleeding time=10 minutes. Bruit + & Thrill +. No lidocaine. Treatment orders were not adjusted during the run.   The following  are the reactions/complications which occured during this treatment and how they were managed. Mild abdominal cramps in the last hour, which was resolved by reducing UF goal. Circuit change once due to heavy clots in V chamber and high VP. Post treatment report given to: Karlene Einstein, RN

## 2019-05-21 NOTE — Plan of Care (Signed)
Problem: Activity Intolerance - Cody Myers - Adult  Goal: Able to perform physical activity as ordered  Outcome: Progress within 12 hours  Goal: Improved activity tolerance ( return to admit level or baseline )  Outcome: Progress within 12 hours     Problem: Bleeding, at Risk or Actual - Cody Myers - Adult  Goal: Absence of impaired coagulation signs and symptoms / active bleeding  Outcome: Progress within 12 hours     Problem: Body Temperature Imbalanced, at Risk or Actual - Cody Myers - Adult  Goal: Body temperature within specified parameters  Outcome: Progress within 12 hours     Problem: Coping Ineffective, Myers / Family / Caregiver - Cody Myers - Adult  Goal: Effective coping (Myers / Family / Caregiver)  Outcome: Progress within 12 hours     Problem: Fluid Volume, Imbalanced - Cody Myers - Adult  Goal: Absence of fluid imbalance signs and symptoms  Outcome: Progress within 12 hours     Problem: Gas Exchange, Impaired- Cody Myers - Adult  Goal: Adequate oxygenation (absence of signs and symptoms of hypoxemia)  Outcome: Progress within 12 hours  Goal: Adequate ventilation (absence of signs and symptoms of hypercarbia)  Outcome: Progress within 12 hours     Problem: GI System Impaired - Cody Myers - Adult  Goal: Passage of soft, formed stool  Outcome: Progress within 12 hours  Goal: Stool elimination per clinical condition ( e.g. GI ostomies )  Outcome: Progress within 12 hours     Problem: GU Elimination Impaired - Cody Myers - Adult  Goal: Able to void spontaneously  Outcome: Progress within 12 hours  Goal: Able to perform urinary catheter care  Outcome: Progress within 12 hours     Problem: Infection, at Risk and Actual - Cody Myers - Adult  Goal: Prevention of infection  Outcome: Progress within 12 hours  Goal: Resolution of Infection  Outcome: Progress within 12  hours     Problem: Nutrition, Alteration in - Cody Myers - Adult  Goal: Adequate nutritional intake  Outcome: Progress within 12 hours  Goal: Maximize nutritional intake per Myers condition  Outcome: Progress within 12 hours     Problem: Transplant Organ Function Altered - Cody Myers - Adult  Goal: Transplant organ function preserved  Outcome: Progress within 12 hours  Goal: Absence of rejection  Outcome: Progress within 12 hours  Goal: Resolution of rejection  Outcome: Progress within 12 hours

## 2019-05-21 NOTE — Progress Notes (Signed)
[facility] PROGRESS / IMMUNOSUPRESSION NOTE     Chief Complaint / Events  POD 1 following DDRT complicated by DGF.     Vitals  Temp:  [35.8 C (96.5 F)-36.6 C (97.8 F)] 36.4 C (97.5 F)  Heart Rate:  [67-92] 86  *Resp:  [15-23] 18  BP: (120-209)/(63-114) 149/82  SpO2:  [92 %-100 %] 97 %    Input / Output  I/O last 3 completed shifts:  In: 5016.67 [P.O.:240; I.V.:3676.67; IV Piggyback:500]  Out: 2683 [Urine:58; Blood:25]  I/O this shift:  In: 840 [P.O.:240]  Out: 1600   12/02 1901 - 12/03 1900  In: 5256.67 [P.O.:480; I.V.:3676.67]  Out: 1683 [Urine:58]    Wt Readings from Last 1 Encounters:   05/20/19 (!) 115.7 kg (255 lb)       Physical Exam  Physical Exam  HEENT: sclera anicteric, oral mucosa  Moist  Cardiac: regular rate and rhythm  Lungs: respiratory effort normal  Abdominal Exam: abdomen non distended. Surgical incision clean, dry, and intact without evidence of infection or hernia defects  Extremities: well perfused, no peripheral edema  Neurologic: alert and oriented, grossly intact  Psychiatric: mood and affect appropriate  Scheduled Meds:   0.9% sodium chloride flush  3 mL Intravenous Q8H SCH    acetaminophen  1,000 mg Oral Q8H SCH    acetaminophen  650 mg Oral Once    antithymocyte glob rabbit (THYMOGLOBULIN) IVPB (adult periph IV)  175 mg Intravenous Once    [START ON 05/22/2019] docusate sodium  250 mg Oral BID SCH    fluconazole  100 mg Oral Q7 Days SCH    lansoprazole  30 mg Oral BID SCH    mycophenolate  500 mg Oral BID SCH    senna  17.2 mg Oral Daily At Bedtime [facility]    [START ON 05/22/2019] sulfamethoxazole-trimethoprim  1 tablet Oral Daily SCH    [START ON 05/25/2019] valGANciclovir  450 mg Oral Twice Weekly SCH     Continuous Infusions:  PRN Meds:   0.9% sodium chloride flush  3 mL Intravenous PRN    acetaminophen  1,000 mg Oral Q8H PRN    HYDROmorphone  0.5 mg Intravenous Q3H PRN    lidocaine  1 patch Topical Daily PRN    neomycin-polymyxin b (NEOSPORIN) GU irrigation   Irrigation PRN     opium-belladonna  60 mg Rectal Q8H PRN    oxyCODONE  5-10 mg Oral Q4H PRN       Data    Recent Labs     05/21/19  0533 05/21/19  0053 05/20/19  0418   WBC 8.2 3.0* 4.3   HGB 13.1* 13.1* 12.1*   HCT 41.5 43.0 38.4*   PLT 169 146 223   NA 137 141 140   K 6.4* 4.9 4.5   CL 97* 98* 94*   CO2 23 24 28    BUN 48* 45* 72*   CREAT 11.72* 10.92* 14.70*   GLU 123 106 86   CA 7.5* 7.6* 8.0*   MG 1.8 2.0 2.3   PO4 4.4 4.5 5.1*   PT  --   --  13.7   INR  --   --  1.1   PTT  --   --  26.5       Microbiology Results (last 24 hours)     Procedure Component Value Units Date/Time    Urine Culture Collected: 05/20/19 1911    Order Status: Completed Specimen: Urine, Midstream Updated: 05/21/19 1326  Comments Collected during surgical procedure.  CALL: [id number]       Bacterial Culture, Urine w/o gram stain No growth at 17 hours.    MRSA Culture [[id number]] Collected: 05/20/19 0405    Order Status: Completed Specimen: Anterior Nares Swab Updated: 05/21/19 1001     Methicillin Resistant Staph aureus Screen No Methicillin resistant Staphylococcus aureus (MRSA)isolated.          Radiology Results   US Renal Transplant    Result Date: 05/21/2019  1. Right lower quadrant renal transplant with patent vasculature and no hydronephrosis. Intraparenchymal resistive indices measure 0.53-0.67. 2. Small post transplant collections, likely hematoma, up to 8.9 mL in total volume adjacent to the inferior pole. Report dictated by: Gavin Potters, MD, signed by: Haskell Flirt, MD PhD Department of Radiology and [place]  HTN (hypertension)  BP good currently.    Monitor closely and restart home medications for BP if needed.    Legally blind  Has son helping out for care.    Anemia of chronic renal failure  Hgb at target currently.    Monitor daily.    Chronic kidney disease-mineral and bone disorder  Will monitor calcium and phos daily.    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    HTN  (hypertension)  BP elevation in PACU immediately post-op. 0.1 clonidine given and BP well controlled now.     - CTM BP    Deceased-donor kidney transplant recipient  S/p DDRT 05/20/19    Minimal urine output since transplant.  Has DGF    HD today for hyperkalemia.    Immunosuppressive management encounter following kidney transplant  Thymo induction    Plan for Tac/MMF/pred maintenance     At risk for opportunistic infections  Bactrim DS daily until 06/19/19 then MWFs until 05/19/20 (Toxo mismatch)  Valcyte renally dosed until 08/18/19  Fluconazole weekly until 06/18/19    At risk of infection transmitted from donor  No known risk from donor    Delayed graft function of kidney  Needed dialysis 05/21/19 after his transplant on 05/20/19 for hyperkalemia.          Nutrition: Patient well nourished, no nutritional consult needed.    Pharmacy: Current medications were reviewed and discussed with patientby pharmacy.    Severity of Illness  Drug therapy requiring intensive monitoring for toxicity    Code Status: FULL     Monico Blitz. Gurjit Loconte, MD  05/21/2019

## 2019-05-21 NOTE — Assessment & Plan Note (Addendum)
Thymo induction completed.   Increase tac to 6/6 and check daily level.  MMF 1000mg  BID   Pred maintenance

## 2019-05-21 NOTE — Assessment & Plan Note (Addendum)
Not a P[facility] increased risk donor and therefore does not require additional monitoring per increased risk donor protocol.

## 2019-05-21 NOTE — Assessment & Plan Note (Deleted)
BP elevation in PACU immediately post-op. 0.1 clonidine given and BP well controlled now.     - CTM BP

## 2019-05-21 NOTE — Plan of Care (Signed)
Problem: Electrolyte Imbalance - Apheresis Hemodialysis Patient - Adult  Goal: Dialyzed with appropriate bath based on lab values  Description: Dialyzed with 3K bath for K of 4.5  Note: On 1 K bath for 3 hours hemodialysis. Pre HD K 6.4 mmol/L.

## 2019-05-21 NOTE — Progress Notes (Signed)
[facility] Post Op Check:   Diagnosis:  s/p DDRT  Intraoperative Course:  Brief Operative Note    Surgeon(s) and Role:     Lubertha Basque. 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  Recipient: cPRA10.5%%; 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  Induction:Thymoglobulin  Serologies: EBV+, CMV+, Toxo+  High risk:No    Backtable:Right kidney;Conventional anatomy - 1 vein/1 artery/1 ureter;Vein extension  Inflow:Right, External Iliac Artery  Outflow:Right, External Iliac Vein  Drainage:Ureteroneocystostomy  Stent:Yes    CIT:23 hours 45 min  WIT:41 min    Estimated Blood Loss: 100  Fluids: 3600 mL crystalloid  UOP 10 mL in 30 minutes post-reperfusion    Wound Class:  2 - Clean/Contaminated (GI, Biliary, Resp, GU tracts entered without spillage)    Incisional Closure Type: Primary closure (all tissues closed tightly or loosely, drains/hardware may exit)    Implants:   Implant Name Type Inv. Item Serial No. Manufacturer Lot No. LRB No. Used Action   STENT URETHRAL 6FR X 16CM DOUBLE J   [id number] - [id number] Stents-Non Des STENT URETHRAL 6FR X 16CM DOUBLE J   [id number]   [id number] Right 1 Implanted        Specimens:       Specimens (From admission, onward)    None           Drains:   Foley    Disposition and Plan:   PACU then floor    Postoperative Instructions:  Stat CBC/BMP in PACU  IVF@50cc /hr  PO pain meds  Lasix gtt @ 10mg /hr  Full thymoglobulin  Steroid  Maintenance  Postop check in 4 hours      S: The patient was seen and examined; he reports his pain is getting better.   O:   BP 148/86 (BP Location: Left upper arm, Patient Position: Lying)   Pulse 90   Temp 36.6 C (97.8 F) (Oral)   Resp 20   Ht 182.9 cm (6')   Wt (!) 115.7 kg (255 lb)   SpO2 97%   BMI 34.58 kg/m   I/O last 3 completed shifts:  In: 600   Out: 2600   CBC        05/21/19  0533 05/21/19  0053 May 24, 2019  0418   WBC 8.2 3.0* 4.3   HGB 13.1* 13.1* 12.1*   HCT 41.5 43.0 38.4*   PLT 169 146 223     Coags        2019-05-24  0418   PTT 26.5   INR 1.1     Chem7        05/21/19  0053 May 24, 2019  0418   NA 141 140   K 4.9 4.5   CL 98* 94*   CO2 24 28   BUN 45* 72*   CREAT 10.92* 14.70*  GLU 106 86     Electrolytes        05/21/19  0053 05/20/19  0418   CA 7.6* 8.0*   MG 2.0 2.3   PO4 4.5 5.1*       Current Facility-Administered Medications:     0.9% sodium chloride flush injection syringe 3 mL, 3 mL, Intravenous, Q8H [place], Cyndia Diver, NP, 3 mL at 05/21/19 0558    0.9% sodium chloride flush injection syringe 3 mL, 3 mL, Intravenous, PRN, Cyndia Diver, NP    acetaminophen (TYLENOL) tablet 1,000 mg, 1,000 mg, Oral, Q8H PRN, Cyndia Diver, NP    acetaminophen (TYLENOL) tablet 1,000 mg, 1,000 mg, Oral, Q8H [place], Cyndia Diver, NP    [START ON 05/22/2019] docusate sodium (COLACE) capsule 250 mg, 250 mg, Oral, BID [place], Cyndia Diver, NP    fluconazole (DIFLUCAN) tablet 100 mg, 100 mg, Oral, Q7 Days [place], Cyndia Diver, NP    furosemide (LASIX) 100 mg in sodium chloride 0.9 % 100 mL infusion, 10 mg/hr, Intravenous, Continuous, Cyndia Diver, NP, Last Rate: 10 mL/hr at 05/21/19 0116, 10 mg/hr at 05/21/19 0116    HYDROmorphone (DILAUDID) injection syringe 0.5 mg, 0.5 mg, Intravenous, Q3H PRN, Cyndia Diver, NP, 0.5 mg at 05/21/19 0317    lansoprazole (PREVACID) capsule 30 mg, 30 mg, Oral, BID [place], Cyndia Diver, NP     lidocaine (LIDODERM) 5 % patch 1 patch, 1 patch, Topical, Daily PRN, Cyndia Diver, NP    mycophenolate (CELLCEPT) capsule 500 mg, 500 mg, Oral, BID [place], Cyndia Diver, NP    neomycin-polymyxin B (NEOSPORIN) 40 mg-200,000 unit/mL 1 mL in sodium chloride 0.9 % 1,000 mL irrigation, , Irrigation, PRN, Cyndia Diver, NP    opium-belladonna (B&O SUPPRETTES) 60-16.2 mg suppository 60 mg, 60 mg, Rectal, Q8H PRN, Cyndia Diver, NP    oxyCODONE (ROXICODONE) tablet 5-10 mg, 5-10 mg, Oral, Q4H PRN, Cyndia Diver, NP    senna (SENOKOT) tablet 17.2 mg, 17.2 mg, Oral, Daily At Bedtime [facility], Cyndia Diver, NP    [START ON 05/22/2019] sulfamethoxazole-trimethoprim (BACTRIM DS,SEPTRA DS) 800-160 mg tablet 1 tablet, 1 tablet, Oral, Daily [place], Cyndia Diver, NP    [START ON 05/25/2019] valGANciclovir (VALCYTE) tablet 450 mg, 450 mg, Oral, Twice Weekly [place], Cyndia Diver, NP      Exam:  General: lying in bed  Neuro: AOx3  CV: RRR  Resp: CTA  GI: hypoactive bowel sounds ABD soft  GU: scant cranberry colored urine in foley  Extremities: MAE +2bilateral DP pulses  Incision: RLQ with strike through on dressing.     A/P  - s/p DDRT  - Pain: adequate control with current plan  - Diet:Clears  - Volume status:Continue lasix drip for now.  Holding IVF as patient is not making much urine.   Consider renal transplant ultrasound today for low urine output

## 2019-05-21 NOTE — Anesthesia Post-Procedure Evaluation (Signed)
Anesthesia Post-op Evaluation    Scheduled date of Operation: 05/20/2019    Scheduled Surgeon(s):Peter G. Stock, MDCarrie Ara Kussmaul, MD  Scheduled Procedure(s):KIDNEY TRANSPLANT CADAVERIC    Final Anesthesia Type: general    Assessment  Respiratory Function:      Airway Patency: Excellent      Respiratory Rate: See vitals below      SpO2: See vitals below      Overall Respiratory Assessment: Stable  Cardiovascular Function:      Pulse Rate: See Vitals Below      Blood Pressure: See Vitals Below      Cardiac status: Stable  Mental Status:      RASS Score: 0 Alert or calm  Temperature: Normothermic  Pain Control: Adequate  Nausea and Vomiting: Absent  Fluids/Hydration Status: Euvolemic    Complications (anesthesia/case associated complications, possible complications, and/or significant issues; as of time of note completion: No apparent complications      Plan  Follow-up care: As per primary team    Post-op Note Status: Complete, patient participated in evaluation, which occurred after recovery from anesthesia but prior to 48 hours from end of case      OB Perinatal DB          Recent Pre-op and Post-op Vital Signs  Vitals:    05/20/19 1211 05/20/19 1626 05/20/19 1726   BP: 136/84 (!) 142/99 (!) 154/100   Pulse: 58 61 61   Resp: 20 18 15    Temp: 36 C (96.8 F) 36.1 C (97 F) 36.3 C (97.3 F)   TempSrc: Oral  Temporal   SpO2: 99% 99% 100%     Last Vital Signs Out of Room to Anesthesia Stop  Vitals Value Taken Time   Pulse 78 05/21/19 0012   Resp 16 05/21/19 0012   SpO2 98 % 05/21/19 0012   BP     Arterial Line BP (mmHg)      Arterial Line MAP (mmHg)     Arterial Line 2 BP (mmHg)     Arterial Line 2 MAP (mmHg)     Temp     Temp src     Vitals shown include unvalidated device data.    Case Tracking Events:  Event Time In   In Facility 05/20/2019 0345   In Pre-op 05/20/2019 1717   Anesthesia Start 05/20/2019 1850   Anesthesia Ready 05/20/2019 1919   In Room 05/20/2019 1853   Airway-Start 05/20/2019 1909   Airway-Stop  05/20/2019 1913   Procedure Start 05/20/2019 1954   Procedure Finish 05/20/2019 2349   Extubation 05/20/2019 2351   Out of Room 05/20/2019 2357   Anesthesia Finish 05/21/2019 0013   In PACU 05/21/2019 0000

## 2019-05-21 NOTE — Discharge Instructions (Addendum)
Discharge Instructions     The transplant team and [facility] extend our best wishes to you upon your discharge from the kidney transplant unit. Please read the following instructions carefully. They contain important information about your follow up, instructions for your recovery, and contact information for the transplant clinic.    We look forward to seeing you in kidney transplant clinic. Please be aware: Clinic visit may be lengthy. Please bring a snack or lunch and a bottle of water for after your lab work is drawn. We would also recommend that you bring something to read or music to listen to.    Your kidney has delayed function. In order for [place] to know when your kidney function is getting better, we need to monitor your urine output when you go home. In order to do this:    1. Please record all of your urine output every day and record it on the same piece of paper with your blood pressure, blood glucoses, etc.    2. Please remember to bring the paper with all of your urine output measurements to your clinic appointment so the NP or MD can review it.      Labs    1. Please have your labs checked on Monday  December 14,  at [facility] has received electronic orders for this. You may get these labs checked at any [facility] location, so choose a center that is close to your home and convenient for you.    [place]     [address], [place] [address]      2. Please ALSO have your labs checked ONE HOUR BEFORE you come to your clinic appointment on Thursday December 10. The [facility] has received electronic lab orders for your labs.The lab is located at [facility], 1st floor (in the same building as the clinic).    3. After next week, you will need to your have labs checked once a week at [facility] and once a week at [facility] before your appointments. This will continue for about 4-8 weeks, depending upon how your recovery is progressing. Your clinic nurse  practitioner or doctor will let you know when and how to change your lab schedule.     4. On the nights before your lab days, take your Prograf as close to 12 hrs before the next day's blood draw as you can (For example, if you are getting your labs done at 8:00 a.m, you should take your prograf as close to 8:00 p.m. on the prior night as possible). Then, take your morning dose of Prograf AFTER you complete your lab work (REMEMBER: bring your medication with you to the appointment).                                                General Instructions    Activity Instructions: Do not lift anything >10 lbs and do not bend forward for 6-8 weeks. This helps prevent hernias from developing. Walk at least 4 times a day and sit up at the table for all meals     Restrictions: No baths or submersion in water until your incisions are healed (in about 4-6 weeks when the line starts to look light pink and not darker pink or red). Showers are ok. IF YOU DRIVE: no  driving while on pain medications     Diet: Good nutrition is important for recovery. Please see the nutrition section of your transplant handbook for details. If you need more nutrition information after you leave the hospital, ask your doctor.     Urination: Empty your bladder every hour during the day and every 2 hours at night for the first week or until you are seen in clinic.    Drain/Tubes: none     Gastrointestinal / Bowels: Some of your transplant medications can cause frequent bowel movements; call the clinic if you are having more than 3-4 loose bowel movements per day for more than 24 hours. Your transplant medications may also cause you to become nauseated or vomit. Call the clinic if you have vomiting or inability to keep down your transplant or other medications or oral fluids for more than a 24 hour period.     Pain: Take your pain medication as instructed by the pharmacist and your nurse using the pain scale; If pain intensifies or pain is unrelieved  with medications, call the clinic.    Other:    1. Wear sunscreen on any exposed areas of your body, at least SPF 30, when going out during daylight hours.    2. Each day, write down daily, weight, blood pressure and heart rate, temperature and questions you might have on the flowsheet provided.  Bring this sheet with you to your kidney clinic appointment.  _______________________________________________________________________                                                                 Medications    1. Your immunosuppressive medications are EXTREMELY important for your health and your kidney's function. You MUST take EVERY prescribed anti-rejection medicine EVERY day, ON TIME, and at the [facility] in order to protect your kidney. Failing to take your medications, changing your medication doses, or skipping medications (even for short periods of time) may result in [place]. IF YOU EVER HAVE ANY DIFFICULTY TAKING OR OBTAINING YOUR ANTI-REJECTION MEDICINES (This could include issues with your pharmacy, insurance, tolerating the medicines, not knowing how to take your medicines correctly, or any other trouble you have), please CALL your nephrologist or [place] at [phone] or [phone] AND WE WILL BE HAPPY TO HELP YOU ADJUST, OBTAIN, OR UNDERSTAND YOUR MEDICATIONS TO YOU.     2. Only take medications on your green medication card when you leave the hospital. Some of the medication doses on your green card may differ from the doses on your medication bottles at home. ALWAYS follow the medication instructions and doses on your green medication card exactly as the pharmacist has prescribed them, even if they are different from the doses on your medication bottles.     3. Do not stop or change the medications on your green card unless instructed to do so by your kidney nurse practitioner or doctor.     4. Do not start taking any medications that are  not on the card unless instructed to do so by your kidney transplant nurse practitioner or doctor.     5. If any of your other doctors need to make a change to your medications, please have them call the kidney  transplant clinic at [phone] and speak with your transplant team first.                                                          When to Call the Clinic     Call the clinic right away and ask to speak to a medical assistant or nurse if you have any of the following:      Temperature greater than 100.4   Persistent nausea and vomiting (for more than 24 hours), especially if you are unable to keep down fluids and medications.   Severe or worsening pain that is not controlled by your pain medications    Signs of infection (pain, swelling, redness, odor or green/yellow/brown/bloody discharge) around the surgical incision   Difficulty breathing, chest pain   Headache or visual disturbances, persistent dizziness or light-headedness   Extreme fatigue or extreme or persistent malaise (generalized feeling of tiredness or being unwell)    If you have any other problems related to your current illness/procedures or if your symptoms persist or worsen, please contact your doctor.           If you have ANY questions, please call the transplant clinic:    [facility]                     [address]                  [address]  [place], [place] [address]                  Phone: [phone] or [phone]

## 2019-05-21 NOTE — Assessment & Plan Note (Addendum)
S/p DDRT 05/20/19  Has DGF. Urine output better today (~861ml last 24h) but cr has not decreased yet.    AVF thrombosed on 12/4. IR unable to declot and now s/p RIJ TDC on 05/22/2019.   Had 2 hours dialysis on 12/4 for high K and 3 hours on 12/7.   His usual HD duration is 4.5H MWF.     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  Prelim results should be out today.    Plan for HD today and resume HD on discharge for now.  Tried to contacted patient's nephrologist (Dr. Kasandra Knudsen, [phone]) but was not able to get hold of anyone or leave a voice message.   Called dialysis center [facility]) and left my contact information for Dr. Kasandra Knudsen for any questions.

## 2019-05-21 NOTE — Progress Notes (Signed)
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    Subjective  Feeling okay.  Mild soreness around the surgical area.  No chest pain or shortness of breath.    Vitals  Temp:  [35.8 C (96.5 F)-36.6 C (97.8 F)] 35.9 C (96.6 F)  Heart Rate:  [58-92] 71  *Resp:  [15-23] 18  BP: (120-209)/(63-114) 122/66  SpO2:  [92 %-100 %] 99 %      Intake/Output Summary (Last 24 hours) at 05/21/2019 1204  Last data filed at 05/21/2019 1158  Gross per 24 hour   Intake 4816.67 ml   Output 1533 ml   Net 3283.67 ml       Physical Exam  General: Chronically ill appearing. No acute distress.  HENT: Normocephalic and atraumatic.  Eye: Blind  Chest: Good inspiratory effort. Lungs clear to auscultation without use of accessory muscles.  Heart: Regular rate and rhythm. No rubs, murmurs or gallops.  Abdomen: Nontender, nondistended without hepatosplenomegaly or masses.  Musculoskeletal: Normal gait, tone, and mass. No clubbing or cyanosis.  Skin: No icterus, lesions, rashes or skin breakdown.  Psychiatric: Normal mood and affect.  Neurological: Cranial nerves grossly intact. Alert and oriented x 3. No asterixis. No tremor.  Vascular: RUE AVF for access  GU:  No bruit from allograph     Data    CBC        05/21/19  0533 05/21/19  0053 05/20/19  0418   WBC 8.2 3.0* 4.3   HGB 13.1* 13.1* 12.1*   HCT 41.5 43.0 38.4*   PLT 169 146 223   MCV 89 90 88       Chem7        05/21/19  0533 05/21/19  0053 05/20/19  0418   NA 137 141 140   K 6.4* 4.9 4.5   CL 97* 98* 94*   CO2 '23 24 28   '$ BUN 48* 45* 72*   CREAT 11.72* 10.92* 14.70*   GLU 123 106 86       Electrolytes        05/21/19  0533 05/21/19  0053 05/20/19  0418    CA 7.5* 7.6* 8.0*   MG 1.8 2.0 2.3   PO4 4.4 4.5 5.1*       Blood Gas  No results found in last 72 hours    Liver Panel        05/20/19  0418   AST 9   ALT 7*   ALKP 80   TBILI 0.7   DBILI 0.2   TP 7.7   ALB 3.9   GGT 13       Coags        05/20/19  0418   PTT 26.5   INR 1.1       Immunosuppression  No results found in last 72 hours    Urinalysis  '@LASTLABCURRENC'$ (GUA:1,BIUA:1,KEUA:1,SGUA:1,HBUA:1,PHUA:1,PRUA:1,NIUA:1,WEUA:1,WCU:1,RCU:1,SQEU:1,CAST:1)@      Microbiology Results (last 24 hours)     Procedure Component Value Units Date/Time    MRSA Culture [[id number]] Collected: 05/20/19 0405    Order Status: Completed Specimen: Anterior Nares Swab  Updated: 05/21/19 1001     Methicillin Resistant Staph aureus Screen No Methicillin resistant Staphylococcus aureus (MRSA)isolated.    Urine Culture [[id number]] Collected: 05/20/19 1911    Order Status: Sent Specimen: Urine, Midstream Updated: 05/20/19 1944          Radiology Results   US Renal Transplant    Result Date: 05/21/2019  1. Right lower quadrant renal transplant with patent vasculature and no hydronephrosis. Intraparenchymal resistive indices measure 0.53-0.67. 2. Small post transplant collections, likely hematoma, up to 8.9 mL in total volume adjacent to the inferior pole. Report dictated by: Gavin Potters, MD, signed by: Haskell Flirt, MD PhD Department of Radiology and Biomedical Imaging           Patient Active Problem List    Diagnosis Date Noted    Deceased-donor kidney transplant recipient 05/21/2019     Priority: 01.     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  S/P DDRT   On 05/20/2019 Surgeon: Alferd Apa  DGF: Hyperkalemia on POD 1 with low UOP Last HD day: 05/21/2019  Post-operative complications: None  Donor issues:  KDPI:38%     Inflow:Right, External Iliac Artery  Outflow:Right, External Iliac Vein  Drainage:Ureteroneocystostomy  Stent:Yes    CIT:23 hours 45 min  WIT:41 min         Immunosuppressive management encounter following kidney transplant 05/21/2019     Induction agent/dose: Thymo lite  Regime: Tac/MMF  Corticosteroid plan: maintenance   cPRA: 10%    Class I:        Lab Results   Component Value Date    Strong A:31 33 12/17/2018    Comments Unacceptables:(A:31 33) 12/17/2018    Tested Date 12/24/2018 12/17/2018         Class II:         Lab Results   Component Value Date    Comments  12/17/2018     No HLA Class II antibodies were detected in this serum sample. Unacceptables:()    Tested Date 12/24/2018 12/17/2018           Transplant follow-up 05/21/2019     Category Problem Type of follow-up needed in clinic? Ordered? Scheduled? Comments   Nursing (e.g., drain/ Foley) NA NA NA None   Lab/ Micro Standard Tac Follow up results   Needs to be ordered None   Imaging studies/ procedures NA NA NA None   Referrals Uroglogy for stent removal NA Already ordered None           Ureteral stent retained of kidney transplant (CMS code) 05/21/2019     Implant Name Type Inv. Item Serial No. Manufacturer Lot No. LRB No. Used Action   STENT URETHRAL 6FR X 16CM DOUBLE J   [id number] - [id number] Stents-Non Des STENT URETHRAL 6FR X 16CM DOUBLE J   [id number]   [id number] Right 1 Implanted           At risk for opportunistic infections 05/21/2019     Transplant Serology Workup:  Recipient        Lab Results   Component Value Date    HBCAB NEG 12/17/2018    HBSAG NEG 12/17/2018    HBABQ >800 12/17/2018    HCV NEG 12/17/2018    RPR Nonreactive 12/17/2018    CMVAB POS (A) 12/17/2018    VCAM NEG 12/17/2018    EBNA POS (A) 12/17/2018    TOXO NEG 12/17/2018         Donor  [facility] Donor UNOS ID (no units)   Date Value   05/20/2019 [id number]     Match ID: [id number]        Prescriptions Prior to Admission          Medications Prior to Admission   Medication Sig Dispense Refill    albuterol 90 mcg/actuation metered dose inhaler Inhale 2 puffs into the lungs       midodrine (PROAMATINE) 5 mg tablet Take 10 mg by mouth 3 (three) times a week on Mondays, Wednesdays, and Fridays On dialysis days only.        MINOXIDIL ORAL Take 10 mg by mouth daily         oxyCODONE-acetaminophen (PERCOCET) 10-325 mg tablet Take 1 tablet by mouth every 4 (four) hours as needed for Pain      sucroferric oxyhydroxide (VELPHORO ORAL) Take by mouth      clonidine HCl (CLONIDINE ORAL) Take 0.1 mg by mouth daily as needed         omeprazole (PRILOSEC) 40 mg capsule Take 40 mg by mouth daily      predniSONE (DELTASONE) 5 mg tablet Take 5 mg by mouth daily            Induction Plan:   Thymo + steroid maintenance     Plan for PPX:   Toxo prophylaxis: (D +/R - Toxo MISMATCH), Septra DS 1 tab PO Daily x 1 month (06/19/2019), then qMWF x 11 months more (05/19/2020) -- 1 year total for Toxo Mismatch   PCP prophylaxis: Septra DS as per above for Toxo ppx   CMV prophylaxis: (D +/R +), Valcyte 900 mg PO Daily (adjusted for renal function) x 3 months (08/18/2019)   Fungal prophylaxis: Fluconazole 100 mg PO q7days x 1 month (06/18/2019)  HBV prophylaxis: Not necessary            At risk of infection transmitted from donor 05/21/2019     Patient underwent transplant with no known donor derived risk factors: Donor is not CDC high risk, not HBV core ab + and there are no known donor derived infections. No need for treatment or monitoring beyond the usual opportunistic infection prophylaxis per protocol. Donor with no known malignancies.  The donor's CMV status is IgG positive and the recipient's CMV status is IgG positive.  Prophylaxis will be determined by risk of CMV transmission.                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    Anemia of chronic renal failure 05/20/2019    Chronic kidney disease-mineral and bone disorder 05/20/2019    ESRD (end stage renal disease) on dialysis (CMS code) 05/16/2019    Pre-transplant evaluation for kidney transplant 04/14/2019      ESRD d/t: Focal Glomerular Sclerosis (Focal Segmental - Fsg), Hypertensive Nephrosclerosis    Listing Date: 12/30/2018  Accumulated List Date: 09/10/2008  Blood Type: AB  UNOS Points: 10.6  cPRA: 10.581%  (as of 12/17/2018) 73m (national): 1% (local): 0%  EPTS: 24%  Days since Last Serum: 116 SA Serum Past Due  Days since Last cPRA: 116      At increased risk for cardiovascular disease 04/12/2019     MYOCARDIAL PERFUSION SCAN (11/20/2018)  1. The pharmacological perfusion study is negative for ischemia.  2. There is not previous study available.  3. The global LV systolic function is normal.  4. Further evaluation with medical management is necessary at this time.  **Post-stress gated SPECT images demonstrated  normal contractility with a calculated LVEF of 61%    ECHO TTE (03/05/2019)  1. Normal left ventricular size with moderate concentric left ventricular hypertrophy. EF 65%  2. grade 2 pseudo-normal diastolic LV dysfunction is present.  3. Overall left ventricular systolic function is grossly normal.  4. Mildly enlarged left atrium.  5. The aortic valve is structurally and functionally normal.  6. Mild thickened and/or calcified mitral leaflets.  7. There is trace mitral regurgitation.  8. Mild tricuspid insufficiency with normal estimated right heart pressure. RVSP 19.00 mmHg      EKG (12/23/2018)  Sinus rhythm  Consider inferior infarct    HOLTER MONITOR (10/22/2018)  IMPRESSION:      This  24-hour  Holter  monitor  is  suggestive of  sinus  rhythm with  sinus tachycardia.      There were  PVCs and PACs. There were no  sustained  arrhythmias seen.              Health care maintenance 04/12/2019     COLONOSCOPY (04/02/2018)  Melanosis in the colon.  No specimens collected.          FSGS (focal segmental glomerulosclerosis) 12/02/2018     BIOPSY KIDNEY (10/01/2007)  Focal Segmental Glomerulosclerosis  Patchy interstitial fibrosis and "thyroidzation type of tubular atrophy           HTN (hypertension) 12/02/2018    ESRD (end stage renal disease) (CMS code) 12/02/2018     ESRD due to FSGS (Bx proven in 09/2007) and HTN.  Been on HD since 2010.  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       Problem-based Assessment and Plan    * Deceased-donor kidney transplant recipient  Assessment & Plan  S/p DDRT 05/20/19    Minimal urine output since transplant.  Has DGF    HD today for hyperkalemia.    Delayed graft function of kidney  Assessment & Plan  Needed dialysis 05/21/19 after his transplant on 05/20/19 for hyperkalemia.      At risk of infection transmitted from donor  Assessment & Plan  No known risk from donor    At risk for opportunistic infections  Assessment & Plan  Bactrim DS daily until 06/19/19 then MWFs until 05/19/20 (Toxo mismatch)  Valcyte renally dosed until 08/18/19  Fluconazole weekly until 06/18/19    Immunosuppressive management encounter following kidney transplant  Assessment & Plan  Thymo induction    Plan for Tac/MMF/pred maintenance     Chronic kidney disease-mineral and bone disorder  Assessment & Plan  Will monitor calcium and phos daily.    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    Anemia of chronic renal failure  Assessment & Plan  Hgb at target currently.    Monitor daily.    Legally blind  Assessment & Plan  Has son helping out for care.    HTN (hypertension)  Assessment & Plan  BP good currently.    Monitor closely and restart home medications for BP if needed.      Code Status: [place], DO  05/21/2019

## 2019-05-22 ENCOUNTER — Inpatient Hospital Stay: Admit: 2019-05-22 | Discharge: 2019-05-22 | Payer: MEDICARE

## 2019-05-22 DIAGNOSIS — T82868A Thrombosis of vascular prosthetic devices, implants and grafts, initial encounter: Secondary | ICD-10-CM

## 2019-05-22 DIAGNOSIS — Z298 Encounter for other specified prophylactic measures: Secondary | ICD-10-CM

## 2019-05-22 DIAGNOSIS — Z4822 Encounter for aftercare following kidney transplant: Secondary | ICD-10-CM

## 2019-05-22 LAB — T&B-CELL CROSSMATCH BY FLOW CY
MCS: 0
MCS: 0
MCS: 0
MCS: 0
Result: NEGATIVE
Result: NEGATIVE
Result: NEGATIVE
Result: NEGATIVE

## 2019-05-22 LAB — PROTEIN, TOTAL, RANDOM URINE
Prot Concentration,UR: 1595 mg/dL
Prot Concentration,UR: 1757 mg/dL
Protein/Creat Ratio, Urine: 24123 mg/g crea — ABNORMAL HIGH (ref <150)
Protein/Creat Ratio, Urine: 30939 mg/g crea — ABNORMAL HIGH (ref <150)

## 2019-05-22 LAB — COMPLETE BLOOD COUNT WITH DIFF
Abs Basophils: 0 10*9/L (ref 0.00–0.10)
Abs Eosinophils: 0 10*9/L (ref 0.00–0.40)
Abs Imm Granulocytes: 0.03 10*9/L (ref <0.10)
Abs Lymphocytes: 0.11 10*9/L — ABNORMAL LOW (ref 1.00–3.40)
Abs Monocytes: 0.35 10*9/L (ref 0.20–0.80)
Abs Neutrophils: 7.46 10*9/L — ABNORMAL HIGH (ref 1.80–6.80)
Hematocrit: 36.7 % — ABNORMAL LOW (ref 41.0–53.0)
Hemoglobin: 11.7 g/dL — ABNORMAL LOW (ref 13.6–17.5)
MCH: 27.6 pg (ref 26.0–34.0)
MCHC: 31.9 g/dL (ref 31.0–36.0)
MCV: 87 fL (ref 80–100)
Platelet Count: 184 10*9/L (ref 140–450)
RBC Count: 4.24 10*12/L — ABNORMAL LOW (ref 4.40–5.90)
WBC Count: 8 10*9/L (ref 3.4–10.0)

## 2019-05-22 LAB — BASIC METABOLIC PANEL (NA, K,
Anion Gap: 17 — ABNORMAL HIGH (ref 4–14)
Calcium, total, Serum / Plasma: 7.7 mg/dL — ABNORMAL LOW (ref 8.4–10.5)
Carbon Dioxide, Total: 24 mmol/L (ref 22–29)
Chloride, Serum / Plasma: 94 mmol/L — ABNORMAL LOW (ref 101–110)
Creatinine: 10.81 mg/dL — ABNORMAL HIGH (ref 0.73–1.24)
Glucose, non-fasting: 104 mg/dL (ref 70–199)
Potassium, Serum / Plasma: 5.7 mmol/L — ABNORMAL HIGH (ref 3.5–5.0)
Sodium, Serum / Plasma: 135 mmol/L (ref 135–145)
Urea Nitrogen, Serum / Plasma: 56 mg/dL — ABNORMAL HIGH (ref 7–25)
eGFR - high estimate: 6 mL/min — ABNORMAL LOW (ref >59)
eGFR - low estimate: 5 mL/min — ABNORMAL LOW (ref >59)

## 2019-05-22 LAB — URINE CULTURE: Bacterial Culture, Urine w/o g: NO GROWTH

## 2019-05-22 LAB — MAGNESIUM, SERUM / PLASMA: Magnesium, Serum / Plasma: 1.9 mg/dL (ref 1.6–2.6)

## 2019-05-22 LAB — PHOSPHORUS, SERUM / PLASMA: Phosphorus, Serum / Plasma: 5.8 mg/dL — ABNORMAL HIGH (ref 2.3–4.7)

## 2019-05-22 LAB — CREATININE, RANDOM URINE
Creatinine, Random Urine: 56.79 mg/dL
Creatinine, Random Urine: 66.12 mg/dL

## 2019-05-22 LAB — ECG 12-LEAD
Atrial Rate: 61 {beats}/min
Calculated P Axis: -16 degrees
Calculated R Axis: -6 degrees
Calculated T Axis: 1 degrees
P-R Interval: 168 ms
QRS Duration: 88 ms
QT Interval: 448 ms
QTcb: 452 ms
Ventricular Rate: 61 {beats}/min

## 2019-05-22 LAB — POCT GLUCOSE
Glucose, iSTAT: 94 mg/dL (ref 70–199)
Glucose, iSTAT: 100 mg/dL (ref 70–199)
Glucose, iSTAT: 100 mg/dL (ref 70–199)

## 2019-05-22 LAB — HLA CROSSMATCH REPORT

## 2019-05-22 MED ORDER — HEPARIN (PORCINE) 1,000 UNIT/ML INJECTION SOLUTION
1000 | Freq: Once | INTRAMUSCULAR | Status: DC
Start: 2019-05-22 — End: 2019-05-23

## 2019-05-22 MED ORDER — METHYLPREDNISOLONE SOD SUCC (PF) 125 MG/2 ML SOLUTION FOR INJECTION: 125 mg/2 mL | INTRAMUSCULAR | Status: DC

## 2019-05-22 MED ORDER — MIDAZOLAM 1 MG/ML INJECTION SOLUTION
1 | Freq: Once | INTRAMUSCULAR | Status: AC | PRN
Start: 2019-05-22 — End: 2019-05-22
  Administered 2019-05-23: via INTRAVENOUS

## 2019-05-22 MED ORDER — AMLODIPINE 5 MG TABLET: 5 mg | ORAL | Status: DC

## 2019-05-22 MED ORDER — FENTANYL (PF) 50 MCG/ML INJECTION SOLUTION
50 | INTRAMUSCULAR | Status: AC
Start: 2019-05-22 — End: 2019-05-22

## 2019-05-22 MED ORDER — HEPARIN (PORCINE) 1,000 UNIT/ML INJECTION SOLUTION
1000 | Freq: Once | INTRAMUSCULAR | Status: AC | PRN
Start: 2019-05-22 — End: 2019-05-22
  Administered 2019-05-23: 01:00:00 1000

## 2019-05-22 MED ORDER — DIPHENHYDRAMINE 50 MG/ML INJECTION SOLUTION: 50 mg/mL | INTRAMUSCULAR | Status: DC

## 2019-05-22 MED ORDER — HYDRALAZINE 20 MG/ML INJECTION SOLUTION
20 | INTRAMUSCULAR | Status: AC
Start: 2019-05-22 — End: 2019-05-22

## 2019-05-22 MED ORDER — HYDROCORTISONE SOD SUCCINATE (PF) 100 MG/2 ML SOLUTION FOR INJECTION
1002 | Freq: Once | INTRAMUSCULAR | Status: DC
Start: 2019-05-22 — End: 2019-05-22

## 2019-05-22 MED ORDER — FENTANYL (PF) 50 MCG/ML INJECTION SOLUTION
50 | Freq: Once | INTRAMUSCULAR | Status: AC | PRN
Start: 2019-05-22 — End: 2019-05-22
  Administered 2019-05-23: via INTRAVENOUS

## 2019-05-22 MED ORDER — HYDRALAZINE 20 MG/ML INJECTION SOLUTION
20 | Freq: Once | INTRAMUSCULAR | Status: AC | PRN
Start: 2019-05-22 — End: 2019-05-22
  Administered 2019-05-23: via INTRAVENOUS

## 2019-05-22 MED ORDER — SODIUM CHLORIDE 0.9 % INTRAVENOUS SOLUTION
0.9 % | Freq: Once | INTRAVENOUS | Status: AC
Start: 2019-05-22 — End: 2019-05-23
  Administered 2019-05-23: 06:00:00 via INTRAVENOUS

## 2019-05-22 MED ORDER — LIDOCAINE 4 % TOPICAL CREAM
4 | Freq: Once | TOPICAL | Status: DC | PRN
Start: 2019-05-22 — End: 2019-05-22

## 2019-05-22 MED ORDER — SODIUM CITRATE 4 % (3 ML) INTRA-CATHETER INJECTION SYRINGE
4 | Status: DC | PRN
Start: 2019-05-22 — End: 2019-05-26

## 2019-05-22 MED ORDER — MIDAZOLAM 1 MG/ML INJECTION SOLUTION
1 mg/mL | INTRAMUSCULAR | Status: AC | PRN
  Administered 2019-05-22: 20:00:00 via INTRAVENOUS

## 2019-05-22 MED ORDER — TACROLIMUS 1 MG CAPSULE, IMMEDIATE-RELEASE
1 mg | ORAL | Status: DC
  Administered 2019-05-23 (×2): via ORAL

## 2019-05-22 MED ORDER — ALTEPLASE 2 MG INTRA-CATHETER SOLUTION
2 mg | Status: AC | PRN
  Administered 2019-05-22: 21:00:00

## 2019-05-22 MED ORDER — FENTANYL (PF) 50 MCG/ML INJECTION SOLUTION
50 | Freq: Once | INTRAMUSCULAR | Status: AC | PRN
Start: 2019-05-22 — End: 2019-05-22
  Administered 2019-05-22: 50 via INTRAVENOUS

## 2019-05-22 MED ORDER — FENTANYL (PF) 50 MCG/ML INJECTION SOLUTION
50 | Freq: Once | INTRAMUSCULAR | Status: AC | PRN
Start: 2019-05-22 — End: 2019-05-22
  Administered 2019-05-22: 23:00:00 via INTRAVENOUS

## 2019-05-22 MED ORDER — FENTANYL (PF) 50 MCG/ML INJECTION SOLUTION
50 | Freq: Once | INTRAMUSCULAR | Status: AC | PRN
Start: 2019-05-22 — End: 2019-05-22
  Administered 2019-05-22: 22:00:00 via INTRAVENOUS

## 2019-05-22 MED ORDER — SODIUM CHLORIDE 0.9 % DIALYSIS CIRCUIT
0.9 | Freq: Once | INTRAVENOUS | Status: AC
Start: 2019-05-22 — End: 2019-05-22
  Administered 2019-05-23: 02:00:00 via INTRAVENOUS

## 2019-05-22 MED ORDER — CEFAZOLIN 1 GRAM SOLUTION FOR INJECTION
1 | INTRAMUSCULAR | Status: AC
Start: 2019-05-22 — End: 2019-05-22

## 2019-05-22 MED ORDER — SODIUM CHLORIDE 0.9 % IV BOLUS
0.9 | INTRAVENOUS | Status: DC | PRN
Start: 2019-05-22 — End: 2019-05-22

## 2019-05-22 MED ORDER — AMLODIPINE 5 MG TABLET
5 mg | ORAL | Status: DC
  Administered 2019-05-23: 05:00:00 via ORAL
  Administered 2019-05-23: 18:00:00 5 mg via ORAL

## 2019-05-22 MED ORDER — DIPHENHYDRAMINE 50 MG/ML INJECTION SOLUTION: 50 mg/mL | INTRAMUSCULAR | Status: AC

## 2019-05-22 MED ORDER — PREDNISONE 10 MG TABLET: 10 mg | ORAL | Status: DC

## 2019-05-22 MED ORDER — MIDAZOLAM 1 MG/ML INJECTION SOLUTION
1 | Freq: Once | INTRAMUSCULAR | Status: AC | PRN
Start: 2019-05-22 — End: 2019-05-22
  Administered 2019-05-22: via INTRAVENOUS

## 2019-05-22 MED ORDER — ACETAMINOPHEN 325 MG TABLET: 325 mg | ORAL | Status: DC

## 2019-05-22 MED ORDER — FENTANYL (PF) 50 MCG/ML INJECTION SOLUTION
50 mcg/mL | INTRAMUSCULAR | Status: AC | PRN
  Administered 2019-05-22: 21:00:00 via INTRAVENOUS

## 2019-05-22 MED ORDER — TACROLIMUS 1 MG CAPSULE, IMMEDIATE-RELEASE
1 | ORAL_CAPSULE | ORAL | 11 refills | Status: DC
Start: 2019-05-22 — End: 2019-05-26

## 2019-05-22 MED ORDER — HYDRALAZINE 20 MG/ML INJECTION SOLUTION
20 | Freq: Once | INTRAMUSCULAR | Status: AC | PRN
Start: 2019-05-22 — End: 2019-05-22
  Administered 2019-05-22: via INTRAVENOUS

## 2019-05-22 MED ORDER — SODIUM CHLORIDE 0.9 % DIALYSIS CIRCUIT
0.9 | Freq: Once | INTRAVENOUS | Status: AC
Start: 2019-05-22 — End: 2019-05-22
  Administered 2019-05-23: 05:00:00 via INTRAVENOUS

## 2019-05-22 MED ORDER — SODIUM CHLORIDE 0.9 % DIALYSIS CIRCUIT
0.9 | INTRAVENOUS | Status: DC | PRN
Start: 2019-05-22 — End: 2019-05-22

## 2019-05-22 MED ORDER — FENTANYL (PF) 50 MCG/ML INJECTION SOLUTION
50 mcg/mL | INTRAMUSCULAR | Status: AC | PRN
  Administered 2019-05-22: 20:00:00 via INTRAVENOUS

## 2019-05-22 MED ORDER — MIDAZOLAM 1 MG/ML INJECTION SOLUTION
1 | Freq: Once | INTRAMUSCULAR | Status: AC | PRN
Start: 2019-05-22 — End: 2019-05-22
  Administered 2019-05-22: 22:00:00 via INTRAVENOUS

## 2019-05-22 MED ORDER — ALTEPLASE 2 MG INTRA-CATHETER SOLUTION
2 | Status: AC
Start: 2019-05-22 — End: 2019-05-22

## 2019-05-22 MED ORDER — HEPARIN (PORCINE) 1,000 UNIT/ML INJECTION SOLUTION
1,000 unit/mL | INTRAMUSCULAR | Status: AC | PRN
  Administered 2019-05-22: 21:00:00 3000

## 2019-05-22 MED ORDER — DIPHENHYDRAMINE 25 MG CAPSULE
25 mg | ORAL | Status: AC
  Administered 2019-05-23: 05:00:00 via ORAL

## 2019-05-22 MED ORDER — DIPHENHYDRAMINE 25 MG CAPSULE: 25 mg | ORAL | Status: DC

## 2019-05-22 MED ORDER — AMLODIPINE 5 MG TABLET
5 | ORAL_TABLET | Freq: Every day | ORAL | 2 refills | Status: DC
Start: 2019-05-22 — End: 2019-05-26

## 2019-05-22 MED ORDER — CEFAZOLIN 1 GRAM SOLUTION FOR INJECTION
1 | Freq: Once | INTRAMUSCULAR | Status: AC | PRN
Start: 2019-05-22 — End: 2019-05-22
  Administered 2019-05-22: 22:00:00 via INTRAVENOUS

## 2019-05-22 MED ORDER — CLONIDINE HCL 0.1 MG TABLET
0.1 mg | ORAL | Status: DC
  Administered 2019-05-22: 17:00:00 via ORAL

## 2019-05-22 MED ORDER — VALGANCICLOVIR 450 MG TABLET
450 | ORAL_TABLET | ORAL | 2 refills | Status: DC
Start: 2019-05-22 — End: 2019-06-09

## 2019-05-22 MED ORDER — HEPARIN (PORCINE) 1,000 UNIT/ML INJECTION SOLUTION
1000 | INTRAMUSCULAR | Status: AC
Start: 2019-05-22 — End: 2019-05-22

## 2019-05-22 MED ORDER — PREDNISONE 10 MG TABLET
10 | Freq: Every day | ORAL | Status: DC
Start: 2019-05-22 — End: 2019-05-25
  Administered 2019-05-24 – 2019-05-25 (×2): via ORAL

## 2019-05-22 MED ORDER — NIFEDIPINE ER 30 MG TABLET,EXTENDED RELEASE 24 HR
30 | ORAL_TABLET | Freq: Every day | ORAL | 2 refills | Status: DC
Start: 2019-05-22 — End: 2019-05-22

## 2019-05-22 MED ORDER — LIDOCAINE (PF) 10 MG/ML (1 %) INJECTION SOLUTION
10 | INTRAMUSCULAR | Status: AC
Start: 2019-05-22 — End: 2019-05-22

## 2019-05-22 MED ORDER — FENTANYL (PF) 50 MCG/ML INJECTION SOLUTION
50 | Freq: Once | INTRAMUSCULAR | Status: AC | PRN
Start: 2019-05-22 — End: 2019-05-22
  Administered 2019-05-22: 21:00:00 via INTRAVENOUS

## 2019-05-22 MED ORDER — LIDOCAINE (PF) 10 MG/ML (1 %) INJECTION SOLUTION
10 | INTRAMUSCULAR | Status: DC | PRN
Start: 2019-05-22 — End: 2019-05-22

## 2019-05-22 MED ORDER — CLONIDINE HCL 0.1 MG TABLET
0.1 mg | ORAL | Status: DC | PRN
  Administered 2019-05-23 – 2019-05-25 (×2): via ORAL

## 2019-05-22 MED ORDER — ACETAMINOPHEN 325 MG TABLET
325 mg | ORAL | Status: AC
  Administered 2019-05-23: 05:00:00 via ORAL

## 2019-05-22 MED ORDER — MIDAZOLAM (PF) 1 MG/ML INJECTION SOLUTION
1 | INTRAMUSCULAR | Status: AC
Start: 2019-05-22 — End: 2019-05-22

## 2019-05-22 MED ORDER — NIFEDIPINE ER 30 MG TABLET,EXTENDED RELEASE 24 HR
30 | Freq: Every day | ORAL | Status: DC
Start: 2019-05-22 — End: 2019-05-22

## 2019-05-22 MED FILL — SOLU-MEDROL (PF) 125 MG/2 ML SOLUTION FOR INJECTION: 125 mg/2 mL | INTRAMUSCULAR | Qty: 2

## 2019-05-22 MED FILL — ACETAMINOPHEN 500 MG TABLET: 500 mg | ORAL | Qty: 2

## 2019-05-22 MED FILL — OXYCODONE 5 MG TABLET: 5 mg | ORAL | Qty: 2

## 2019-05-22 MED FILL — SULFAMETHOXAZOLE 800 MG-TRIMETHOPRIM 160 MG TABLET: 800-160 mg | ORAL | Qty: 1

## 2019-05-22 MED FILL — LIDOCAINE 2 % MUCOSAL JELLY IN APPLICATOR: 2 % | Qty: 10

## 2019-05-22 MED FILL — FENTANYL (PF) 50 MCG/ML INJECTION SOLUTION: 50 mcg/mL | INTRAMUSCULAR | Qty: 2

## 2019-05-22 MED FILL — CATHFLO ACTIVASE 2 MG INTRA-CATHETER SOLUTION: 2 mg | Qty: 2

## 2019-05-22 MED FILL — LANSOPRAZOLE 30 MG CAPSULE,DELAYED RELEASE: 30 mg | ORAL | Qty: 1

## 2019-05-22 MED FILL — THYMOGLOBULIN 25 MG INTRAVENOUS SOLUTION: 25 mg | INTRAVENOUS | Qty: 35

## 2019-05-22 MED FILL — CEFAZOLIN 1 GRAM SOLUTION FOR INJECTION: 1 gram | INTRAMUSCULAR | Qty: 2000

## 2019-05-22 MED FILL — MIDAZOLAM (PF) 1 MG/ML INJECTION SOLUTION: 1 mg/mL | INTRAMUSCULAR | Qty: 5

## 2019-05-22 MED FILL — DIPHENHYDRAMINE 25 MG CAPSULE: 25 mg | ORAL | Qty: 1

## 2019-05-22 MED FILL — DOCUSATE SODIUM 250 MG CAPSULE: 250 mg | ORAL | Qty: 1

## 2019-05-22 MED FILL — HEPARIN (PORCINE) 1,000 UNIT/ML INJECTION SOLUTION: 1000 unit/mL | INTRAMUSCULAR | Qty: 10

## 2019-05-22 MED FILL — CLONIDINE HCL 0.1 MG TABLET: 0.1 mg | ORAL | Qty: 1

## 2019-05-22 MED FILL — MYCOPHENOLATE MOFETIL 250 MG CAPSULE: 250 mg | ORAL | Qty: 2

## 2019-05-22 MED FILL — HYDRALAZINE 20 MG/ML INJECTION SOLUTION: 20 mg/mL | INTRAMUSCULAR | Qty: 1

## 2019-05-22 MED FILL — SENNA LAX 8.6 MG TABLET: 8.6 mg | ORAL | Qty: 2

## 2019-05-22 NOTE — Nursing Note (Signed)
Pre HD Nursing Note  Pre treatment report received from [place], [place]  Pre treatment vitals: Wt 121.1 kg morning weight, BP 180/97, Pulse 80/min.  2 hours HD started in Pt Room via RCW HD Tunneled Catheter without problem. QB 350 ml/min, ordered UF 0-2L as tolerated. NS prime.    18:45 Handoff to Maylanie, RN to continue HD.    Silvestre Mesi, RN, CNN

## 2019-05-22 NOTE — Nursing Note (Signed)
Pre Treatment Nursing Note  Treatment report received from Donnetta Simpers, RN    Medication Administration:  Aranesp: 0 mcg IVP  Zemplar: 0 mcg IVP  Blood Transfusion: No   After HD MAR reviewed and After HD MAR: no medications due after HD    Post Treatment Nursing Note  Post tx vitals: Wt 119.1 kg via Bed scale, BP 180/100, Pulse 89.   Pt tolerated 2 hr treatment with net UF 2 L fluid removed. Catheter site clean, dry and intact, dressing & Tegos last changed 12/4. Treatment orders were not adjusted during the run.   There were no adverse reactions/complications during this procedure. Post treatment report given to: Sharion Balloon, RN

## 2019-05-22 NOTE — Progress Notes (Signed)
[facility] PROGRESS / IMMUNOSUPRESSION NOTE     Chief Complaint / Events  POD 2 following DDRT complicated by DGF.  (fistula clotted ---will go to IR for declotting)    Vitals  Temp:  [35.7 C (96.3 F)-37.1 C (98.8 F)] 35.9 C (96.6 F)  Heart Rate:  [69-88] 79  *Resp:  [16-20] 16  BP: (120-163)/(63-111) 163/111  SpO2:  [96 %-100 %] 99 %    Input / Output  I/O last 3 completed shifts:  In: 6316.67 [P.O.:1040; I.V.:3676.67; IV Piggyback:1000]  Out: 5176 [Urine:133; Blood:25]  No intake/output data recorded.  12/03 1901 - 12/04 1900  In: 42 [P.O.:560]  Out: 58 [Urine:70]    Wt Readings from Last 1 Encounters:   05/22/19 (!) 121.1 kg (266 lb 15.6 oz)       Physical Exam  Physical Exam  HEENT: sclera anicteric, oral mucosa  Moist  Cardiac: regular rate and rhythm  Lungs: respiratory effort normal  Abdominal Exam: abdomen non distended. Surgical incision clean, dry, and intact without evidence of infection or hernia defects  Extremities: well perfused, no peripheral edema  Neurologic: alert and oriented, grossly intact  Psychiatric: mood and affect appropriate  Scheduled Meds:   0.9% sodium chloride flush  3 mL Intravenous Q8H [place]    acetaminophen  1,000 mg Oral Q8H [place]    acetaminophen  650 mg Oral Once    [START ON 05/23/2019] acetaminophen  650 mg Oral Once    antithymocyte glob rabbit (THYMOGLOBULIN) IVPB (adult periph IV)  175 mg Intravenous Once    [START ON 05/23/2019] antithymocyte glob rabbit (THYMOGLOBULIN) IVPB (adult periph IV)  175 mg Intravenous Once    cloNIDine HCL  0.1 mg Oral BID SCH    diphenhydrAMINE  25 mg Oral Once    Or    diphenhydrAMINE  25 mg Intravenous Once    [START ON 05/23/2019] diphenhydrAMINE  25 mg Oral Once    Or    [START ON 05/23/2019] diphenhydrAMINE  25 mg Intravenous Once    docusate sodium  250 mg Oral BID SCH    fluconazole  100 mg Oral Q7 Days [place]    lansoprazole  30 mg Oral BID [place]    methylPREDNISolone  125 mg Intravenous Once    mycophenolate  500 mg Oral BID [place]     [START ON 05/23/2019] predniSONE  60 mg Oral Once    senna  17.2 mg Oral Daily At Bedtime [facility]    sulfamethoxazole-trimethoprim  1 tablet Oral Daily [place]    tacrolimus  2 mg Oral BID KTU/LTU    [START ON 05/25/2019] valGANciclovir  450 mg Oral Twice Weekly [place]     Continuous Infusions:  PRN Meds:   0.9% sodium chloride flush  3 mL Intravenous PRN    HYDROmorphone  0.5 mg Intravenous Q3H PRN    lidocaine  1 patch Topical Daily PRN    neomycin-polymyxin b (NEOSPORIN) GU irrigation   Irrigation PRN    opium-belladonna  60 mg Rectal Q8H PRN    oxyCODONE  5-10 mg Oral Q4H PRN       Data    Recent Labs     05/22/19  0615 05/21/19  0533 05/21/19  0053 05/20/19  0418   WBC 8.0 8.2 3.0* 4.3   HGB 11.7* 13.1* 13.1* 12.1*   HCT 36.7* 41.5 43.0 38.4*   PLT 184 169 146 223   NA 135 137 141 140   K 5.7* 6.4* 4.9 4.5   CL 94*  97* 98* 94*   CO2 24 23 24 28    BUN 56* 48* 45* 72*   CREAT 10.81* 11.72* 10.92* 14.70*   GLU 104 123 106 86   CA 7.7* 7.5* 7.6* 8.0*   MG 1.9 1.8 2.0 2.3   PO4 5.8* 4.4 4.5 5.1*   PT  --   --   --  13.7   INR  --   --   --  1.1   PTT  --   --   --  26.5       Microbiology Results (last 24 hours)     Procedure Component Value Units Date/Time    Urine Culture Collected: 05/20/19 1911    Order Status: Completed Specimen: Urine, Midstream Updated: 05/22/19 14/04/20     Comments Collected during surgical procedure.  CALL: [id number]       Bacterial Culture, Urine w/o gram stain No growth 2 days.          Radiology Results   [id number] Renal Transplant    Result Date: 05/22/2019  1. Right lower quadrant renal transplant with patent vasculature and no hydronephrosis. 2. No significant change in a 2.9 mL hypoechoic collection adjacent to the interpolar cortex.The previously demonstrated larger fluid collection along the inferior pole is no longer seen. 3. The proximal portion of the ureteral stent appears well positioned in the collecting system. The distal aspect is not definitively visualized due to the  decompressed bladder.           Assessment & Plan  HTN (hypertension)  BP good currently.    Monitor closely and restart home medications for BP if needed.    Legally blind  Has son helping out for care.    Anemia of chronic renal failure  Hgb at target currently.    Monitor daily.    Chronic kidney disease-mineral and bone disorder  Will monitor calcium and phos daily.    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    HTN (hypertension)  BP elevation in PACU immediately post-op. 0.1 clonidine given and BP well controlled now.     - CTM BP    Deceased-donor kidney transplant recipient  S/p DDRT 05/20/19    Minimal urine output since transplant.  Has DGF    HD today for hyperkalemia.    Immunosuppressive management encounter following kidney transplant  Thymo induction    Plan for Tac/MMF/pred maintenance     At risk for opportunistic infections  Bactrim DS daily until 06/19/19 then MWFs until 05/19/20 (Toxo mismatch)  Valcyte renally dosed until 08/18/19  Fluconazole weekly until 06/18/19    At risk of infection transmitted from donor  No known risk from donor    Delayed graft function of kidney  Needed dialysis 05/21/19 after his transplant on 05/20/19 for hyperkalemia.      AV fistula occlusion (CMS code)  Will get IR consult for fistulagram.     Plan for today: Start TAC 2 mg po BID  IR for declotting    Nutrition: Patient well nourished, no nutritional consult needed.    Pharmacy: Current medications were reviewed and discussed with patientby pharmacy.    Severity of Illness  Drug therapy requiring intensive monitoring for toxicity    Code Status: FULL     14/2/20. Semisi Biela, MD  05/22/2019

## 2019-05-22 NOTE — Interdisciplinary (Signed)
CASE MANAGEMENT ADULT  ASSESSMENT        CM ADULT ASSESSMENT (most recent)      CM Adult Assessment - 05/22/19 1346        Adult Assessment    Transfer from outside facility  No     Transfer Agreement  No     Referred By:  Case management process     Assessment Type  Admission Assessment     Interdisciplinary Rounds  05/22/19     Diagnosis/Surgical Procedure  kidney transplant     Inpatient Referral: met/spoke with:  Son,/[facility] caregiver Alexandre Faries [phone]),     Does the Patient have a PCP?  No     Payor source  Arboriculturist     *Prior Living Situation  Apartment;Caregiver;Children     Residential Access  Stairs/Steps (specify #)    1 flight of stairs, patinet able to climb with guidance    Prior Functional Status  Ambulated with a cane;Patient required assistance with IADLs;Patient required assistance with ADLs     Prior Mental Status  Alert, oriented     Prior DME  --    Blind Seeing Cane    Current Functional Status  Decline in functional status     Current Mental Status  Alert, oriented     Planned Transportation Arrangements  Patient is being transported via family/friend/caregiver    son will give a ride home.       Proposed Discharge Plan    Anticipated Discharge Needs  Will continue to follow for discharge planning needs     Referrals made:  Yes     Assessment Complete  Yes        Previous Admission Info    Patient been readmitted in last 30 days?  No         Son  Dorse Locy [phone]),  confirmed he will be able to pickup patient on day of discharge.     Earnest Rosier, RN CM  [facility] Urology Case Manager  Phone:  [phone]  05/22/2019  1:55 PM

## 2019-05-22 NOTE — Consults (Signed)
Thank you for the consult. Will schedule for dialysis fistulagram and intervention today.    Venetia Maxon, MD  Associate Professor of Clinical Radiology

## 2019-05-22 NOTE — Progress Notes (Addendum)
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    Subjective  Feeling not too bad.  Reports no chest pain or shortness of breath.  Tolerated HD fine yesterday.    Vitals  Temp:  [35.7 C (96.3 F)-37.1 C (98.8 F)] 35.9 C (96.6 F)  Heart Rate:  [55-88] 69  *Resp:  [8-24] 15  BP: (129-192)/(66-111) 185/95  SpO2:  [95 %-100 %] 99 %      Intake/Output Summary (Last 24 hours) at 05/22/2019 1545  Last data filed at 05/22/2019 0746  Gross per 24 hour   Intake 1060 ml   Output 75 ml   Net 985 ml       Physical Exam  General:Chronically ill appearing. No acute distress.  HENT:Normocephalic and atraumatic.  GBE:EFEOF  Chest:Good inspiratory effort. Lungs clear to auscultation without use of accessory muscles.  Heart:Regular rate and rhythm. No rubs, murmurs or gallops.  Abdomen:Nontender, nondistended without hepatosplenomegaly or masses.  Musculoskeletal:Normal gait, tone, and mass. No clubbing or cyanosis.  Skin:No icterus, lesions, rashes or skin breakdown.  Psychiatric:Normal mood and affect.  Neurological:Cranial nerves grossly intact. Alert and oriented x 3. No asterixis. No tremor.  Vascular:RUE AVF for access.  No bruit noted this morning.  GU:  No bruit from allograph     Data    CBC        05/22/19  0615 05/21/19  0533 05/21/19  0053   WBC 8.0 8.2 3.0*   HGB 11.7* 13.1* 13.1*   HCT 36.7* 41.5 43.0   PLT 184 169 146   MCV 87 89 90       Chem7        05/22/19  0615 05/21/19  0533 05/21/19  0053   NA 135 137 141   K 5.7* 6.4* 4.9   CL 94* 97* 98*   CO2 '24 23 24   '$ BUN 56* 48* 45*   CREAT 10.81* 11.72* 10.92*   GLU 104 123 106       Electrolytes        05/22/19   0615 05/21/19  0533 05/21/19  0053   CA 7.7* 7.5* 7.6*   MG 1.9 1.8 2.0   PO4 5.8* 4.4 4.5       Blood Gas  No results found in last 72 hours    Liver Panel        05/20/19  0418   AST 9   ALT 7*   ALKP 80   TBILI 0.7   DBILI 0.2   TP 7.7   ALB 3.9   GGT 13       Coags        05/20/19  0418   PTT 26.5   INR 1.1       Immunosuppression  No results found in last 72 hours    Urinalysis  '@LASTLABCURRENC'$ (GUA:1,BIUA:1,KEUA:1,SGUA:1,HBUA:1,PHUA:1,PRUA:1,NIUA:1,WEUA:1,WCU:1,RCU:1,SQEU:1,CAST:1)@      Microbiology Results (last 24 hours)     Procedure Component Value Units Date/Time    Urine Culture [[id number]] Collected: 05/20/19 1911    Order Status: Completed Specimen: Urine, Midstream Updated: 05/22/19 2549  Comments Collected during surgical procedure.  CALL: [id number]       Bacterial Culture, Urine w/o gram stain No growth 2 days.          Radiology Results   US Renal Transplant    Result Date: 05/22/2019  1. Right lower quadrant renal transplant with patent vasculature and no hydronephrosis. 2. Scan postoperative fluid collections. 3. The proximal portion of the ureteral stent appears well positioned in the collecting system which is decompressed. The distal aspect is not definitively visualized due to the decompressed bladder. Report dictated by: Elvera Bicker, MD, signed by: Guadalupe Dawn, MD Department of Radiology and Biomedical Imaging           Patient Active Problem List    Diagnosis Date Noted    Deceased-donor kidney transplant recipient 05/21/2019     Priority: 01.     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  S/P DDRT   On 05/20/2019 Surgeon: Alferd Apa  DGF: Hyperkalemia on POD 1 with low UOP Last HD day: 05/21/2019  Post-operative complications: None  Donor issues:  KDPI:38%     Inflow:Right, External Iliac Artery  Outflow:Right, External Iliac Vein  Drainage:Ureteroneocystostomy  Stent:Yes    CIT:23 hours 45 min  WIT:41 min        AV fistula occlusion (CMS code) 05/22/2019     Hyperkalemia 05/22/2019    Immunosuppressive management encounter following kidney transplant 05/21/2019     Induction agent/dose: Thymo lite  Regime: Tac/MMF  Corticosteroid plan: maintenance   cPRA: 10%    Class I:        Lab Results   Component Value Date    Strong A:31 33 12/17/2018    Comments Unacceptables:(A:31 33) 12/17/2018    Tested Date 12/24/2018 12/17/2018         Class II:         Lab Results   Component Value Date    Comments  12/17/2018     No HLA Class II antibodies were detected in this serum sample. Unacceptables:()    Tested Date 12/24/2018 12/17/2018           Transplant follow-up 05/21/2019     Category Problem Type of follow-up needed in clinic? Ordered? Scheduled? Comments   Nursing (e.g., drain/ Foley) NA NA NA None   Lab/ Micro Standard Tac Follow up results   Needs to be ordered None   Imaging studies/ procedures NA NA NA None   Referrals Uroglogy for stent removal NA Already ordered None           Ureteral stent retained of kidney transplant (CMS code) 05/21/2019     Implant Name Type Inv. Item Serial No. Manufacturer Lot No. LRB No. Used Action   STENT URETHRAL 6FR X 16CM DOUBLE J   [id number] - [id number] Stents-Non Des STENT URETHRAL 6FR X 16CM DOUBLE J   [id number]   [id number] Right 1 Implanted           At risk for opportunistic infections 05/21/2019     Transplant Serology Workup:  Recipient        Lab Results   Component Value Date    HBCAB NEG 12/17/2018    HBSAG NEG 12/17/2018    HBABQ >800 12/17/2018    HCV NEG 12/17/2018    RPR Nonreactive 12/17/2018    CMVAB POS (A) 12/17/2018    VCAM NEG 12/17/2018    EBNA POS (A) 12/17/2018    TOXO NEG 12/17/2018  Donor        Cadaver Donor UNOS ID (no units)   Date Value   05/20/2019 [id number]     Match ID: [id number]        Prescriptions Prior to Admission          Medications Prior to Admission   Medication Sig Dispense Refill    albuterol 90 mcg/actuation metered dose inhaler Inhale 2 puffs into the lungs       midodrine (PROAMATINE) 5 mg tablet Take 10 mg by mouth 3 (three) times a week on Mondays, Wednesdays, and Fridays On dialysis days only.        MINOXIDIL ORAL Take 10 mg by mouth daily         oxyCODONE-acetaminophen (PERCOCET) 10-325 mg tablet Take 1 tablet by mouth every 4 (four) hours as needed for Pain      sucroferric oxyhydroxide (VELPHORO ORAL) Take by mouth      clonidine HCl (CLONIDINE ORAL) Take 0.1 mg by mouth daily as needed         omeprazole (PRILOSEC) 40 mg capsule Take 40 mg by mouth daily      predniSONE (DELTASONE) 5 mg tablet Take 5 mg by mouth daily            Induction Plan:   Thymo + steroid maintenance     Plan for PPX:   Toxo prophylaxis: (D +/R - Toxo MISMATCH), Septra DS 1 tab PO Daily x 1 month (06/19/2019), then qMWF x 11 months more (05/19/2020) -- 1 year total for Toxo Mismatch   PCP prophylaxis: Septra DS as per above for Toxo ppx   CMV prophylaxis: (D +/R +), Valcyte 900 mg PO Daily (adjusted for renal function) x 3 months (08/18/2019)   Fungal prophylaxis: Fluconazole 100 mg PO q7days x 1 month (06/18/2019)  HBV prophylaxis: Not necessary            At risk of infection transmitted from donor 05/21/2019     Patient underwent transplant with no known donor derived risk factors: Donor is not CDC high risk, not HBV core ab + and there are no known donor derived infections. No need for treatment or monitoring beyond the usual opportunistic infection prophylaxis per protocol. Donor with no known malignancies.  The donor's CMV status is IgG positive and the recipient's CMV status is IgG positive.  Prophylaxis will be determined by risk of CMV transmission.                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    Anemia of chronic renal failure 05/20/2019    Chronic kidney disease-mineral and bone disorder 05/20/2019    ESRD (end stage renal disease) on dialysis (CMS code) 05/16/2019    Pre-transplant evaluation for kidney transplant 04/14/2019      ESRD d/t: Focal Glomerular Sclerosis (Focal Segmental - Fsg), Hypertensive Nephrosclerosis    Listing Date: 12/30/2018  Accumulated List Date: 09/10/2008  Blood Type: AB  UNOS Points: 10.6  cPRA: 10.581%  (as of 12/17/2018) 59m (national): 1% (local): 0%  EPTS: 24%  Days since Last Serum: 116 SA Serum Past Due  Days since Last cPRA: 116      At increased risk for cardiovascular disease 04/12/2019     MYOCARDIAL PERFUSION SCAN (11/20/2018)  1. The pharmacological perfusion study is negative for ischemia.  2. There is not previous study available.  3. The global LV systolic function is normal.  4. Further evaluation with medical management is necessary at  this time.  **Post-stress gated SPECT images demonstrated normal contractility with a calculated LVEF of 61%    ECHO TTE (03/05/2019)  1. Normal left ventricular size with moderate concentric left ventricular hypertrophy. EF 65%  2. grade 2 pseudo-normal diastolic LV dysfunction is present.  3. Overall left ventricular systolic function is grossly normal.  4. Mildly enlarged left atrium.  5. The aortic valve is structurally and functionally normal.  6. Mild thickened and/or calcified mitral leaflets.  7. There is trace mitral regurgitation.  8. Mild tricuspid insufficiency with normal estimated right heart pressure. RVSP 19.00 mmHg      EKG (12/23/2018)  Sinus rhythm  Consider inferior infarct    HOLTER MONITOR (10/22/2018)  IMPRESSION:      This  24-hour  Holter  monitor  is  suggestive of  sinus  rhythm with  sinus tachycardia.      There were  PVCs and PACs. There were no  sustained  arrhythmias seen.              Health care maintenance 04/12/2019     COLONOSCOPY (04/02/2018)  Melanosis in the colon.  No specimens collected.          FSGS (focal segmental glomerulosclerosis) 12/02/2018     BIOPSY KIDNEY (10/01/2007)  Focal Segmental Glomerulosclerosis  Patchy interstitial fibrosis and "thyroidzation type of tubular atrophy           HTN (hypertension) 12/02/2018    ESRD (end stage renal disease) (CMS code) 12/02/2018     ESRD due to FSGS (Bx proven in 09/2007) and HTN.  Been on HD since 2010.  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       Problem-based Assessment and Plan    * Deceased-donor kidney transplant recipient  Assessment & Plan  S/p DDRT 05/20/19    Minimal urine output since transplant.  Has DGF    HD today for hyperkalemia.  IR unable to declot AVF.  Plan to HD using TDC (IR to place today).    Hyperkalemia  Assessment & Plan  Plan for HD today    AV fistula occlusion (CMS code)  Assessment & Plan  IR was unsuccessful opening up AVF on 12/4    Plan to place [facility]    Delayed graft function of kidney  Assessment & Plan  Needed dialysis 05/21/19 after his transplant on 05/20/19 for hyperkalemia.      At risk of infection transmitted from donor  Assessment & Plan  No known risk from donor    At risk for opportunistic infections  Assessment & Plan  Bactrim DS daily until 06/19/19 then MWFs until 05/19/20 (Toxo mismatch)  Valcyte renally dosed until 08/18/19  Fluconazole weekly until 06/18/19    Immunosuppressive management encounter following kidney transplant  Assessment & Plan  Thymo induction    Plan for Tac/MMF/pred maintenance     Starting tac at '2mg'$  BID.  Monitor tac trough daily    Chronic kidney disease-mineral and bone disorder  Assessment & Plan  Will monitor calcium and phos daily.    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    Anemia of chronic renal failure  Assessment & Plan  Hgb at target currently.    Monitor daily.    Legally blind  Assessment & Plan  Has son helping out for care.    HTN (hypertension)  Assessment & Plan  BP on the high side this  morning.    Restarting clonidine 0.1mg  BID.  Will add on nifedipine 30mg  if needed as well.      Code Status: [place], DO  05/22/2019

## 2019-05-22 NOTE — Assessment & Plan Note (Addendum)
Unsuccessful declot on 12/4 .  TDC inserted on 12/4.

## 2019-05-22 NOTE — Procedures (Signed)
Location: Interventional Radiology     Resident: Vernard Gambles  Attending: Lerry Paterson    Insertion Date: 05/22/19 - Time: 17:20    VERIFIED CONSENT OBTAINED: Yes    Results of RELEVANT LABORATORY TESTS have been REVIEWED: Yes         TIMEOUT CONDUCTED PRIOR TO PROCEDURE: Yes        Patient Diagnosis: Delayed renal graft function     Procedure: Tunneled Dialysis catheter insertion.  Brand: Covidien  Type: Double lumen Palindrome  Successful insertion of central line: Yes  Antimicrobial catheter used: Yes    Ultrasound Guidance: Yes     Size Inserted: 13.5 Fr    Medications Used:  1% lidocaine subcutaneous, 20 ml       Heparin 1000 units/ml flush, volume per manufacturers instructions.   Moderate sedation         Complications: None    Estimated Blood Loss: 10 ml    Follow-up:  Fluoroscopic confirmation of appropriate final position.    Additional Comments(i.e., use of Micro-puncture kit):   Micro-puncture kit, modified Seldinger technique and fluoroscopy used during insertion.    Inspection of site by attending responsible for procedure and review of radiograph by Radiology Attending. No evidence of inappropriate retained object.    Occupation of Inserter: Resident, attending    Reason for Insertion (Indication):  Hemodialysis    Inserter performed hand hygiene prior to central line insertion: Yes    Maximal sterile barrier precautions used:  Mask/Eye Shield:  Yes  Sterile Gown:   Yes  Sterile Gloves:  Yes  Cap:    Yes  Large Sterile Drape: Yes    Skin preparation (check all that apply):  Chlorhexidine Gluconate    Was skin preparation agent completely dry at time of first skin puncture? Yes    Insertion Site: Right external jugular vein.                      Central line catheter type: Tunneled dialysis    Number of lumens: 2    Central line exchanged over a guidewire: No    Antiseptic ointment applied to site: No    _____________________________________________________________  (For Attending Use Only)  I have personally  examined the patient and evaluated the need for the above procedure. YES  I have personally performed or supervised the procedure(s). YES    Disposition:  The patient tolerated the procedure well with no immediate complications and was transferred to the holding area in stable condition. Full report to follow.

## 2019-05-22 NOTE — Other (Signed)
FACE TO FACE ENCOUNTER CERTIFICATION - REQUIRED FOR MEDICARE PATIENTS NEEDING HOME HEALTH SERVICES AND/OR DME    Medical Condition:  This face-to-face encounter included evaluation/management of the following medical condition(s) which is the primary reason for home healthcare services:    s/p Transplantation    Clinical Findings:  I am referring this patient for [facility] based on the following clinical findings that support the ordered services and the patient's homebound status:    Services Required:  Home Care:    Skilled Nursing: medication management and disease management  Skilled PT: Home safety evaluation and functional decline  Skilled OT: Promote ADLS and evaluate for DME     Homebound status:  The above clinical findings support the following homebound status:  Requires physical assistance to safely leave home    I attest that I had a face to face encounter with this patient on (enter date): 05/22/2019    Attending Physician's Name/NPI: Dr Nona Dell NPI- [id number]  Date: 05/22/19

## 2019-05-22 NOTE — Procedures (Signed)
IR PROCEDURE NOTE.    Fistulogram showed occlusion of the radial-cephalic fistula after the most peripheral aneurysmal dilation. The fistula was declotted using pharmaco-mechanial thrombolsysis. However, adequate flow within the fistula could not restored despite multiple angioplasties to 7 mm, balloon sweeps, and placement of 8 mm by 50 mm and 7 mm by 42mm covered Viabahn stents.    Tunneled hemodialysis catheter was placed via the right external jugular vein.    Moderate sedation was administered.    No immediate complications.

## 2019-05-22 NOTE — Interdisciplinary (Signed)
Inpatient Post-Transplant Social Work Assessment    Cody Myers is a 42 y.o. male who had a DDRT on 05/20/2019. Please see patient's chart for current health status, list of comorbidities, and other pertinent patient information, as well as pre-transplant psychosocial assessment from Shelda Jakes, LCSW on 12/15/2018.  SW met w/ patient at bedside on POD 2. Patient's mother Cody Myers was also on the phone during the meeting. Patient was alert and oriented X4, pleasant, engaged easily, and willing to participate in post-transplant assessment.  Patient said he is doing alright post-surgery.  He is anxious for his new kidney to start working and happy to have received it.     Discharge plan:     Primary Caregiver: Patient's son Cody Myers, [phone].  Daizah lives with patient and is his Environmental health practitioner.    Secondary Caregiver: Patient's mother Cody Myers, [phone], who lives nearby and is not working.  Patient's other son Cody Myers, [phone] lives nearby with his mother and is also available as a back up caregiver.    Local Address Post Transplant: [address], [place] [address]    Living Situation: Patient lives in rented apartment with his son Cody Myers and Cody Myers girlfriend.    Transport to/from follow-up appts: Patient's son Cody Myers.  He recently broke his left foot and is using crutches, but is still able to drive.    Functional Status:  Patient is legally blind and needs assistance with ADLs and iADLs from his son due to his vision loss.    Dialysis: Patient initiated dialysis on 09/10/08.  Patient may require dialysis upon d/c due to DGF.    Additional Needs: None at this time.    Income / Occupational Status: Patient is disabled and receives SSDI based on his blindness disability, therefore patient will not lose SSDI eligibility one year post-transplant.    Insurance: Medicare/MediCal.    Access to Medications: Patient denied concerns regarding his ability to obtain critical  post-transplant medications.      Mental Health/Adjustment to Transplant: Patient denied current symptoms of depression or anxiety. Patient appears to be adjusting well to transplant.  He looks forward to not being on dialysis anymore, and to having his body back and functioning normally.    Substance Use: Patient denied use of ETOH, tobacco / cigarettes, marijuana, or illicits.     First Outpatient Clinic Appointment: 05/28/2019 at 10:20 a.m. with Dr. Truman Hayward.    Education / Follow-up:    Patient was counseled on strict adherence to post-transplant medication regimen.  Patient was informed about possible side effects from immunosuppressants.   Patient is aware that Outpatient KT staff available for support post-hospitalization. Pt strongly encouraged to contact [facility] immediately if pt has any issues obtaining needed post-transplant medications.     SW acknowledged possibility of complex feelings about receiving a deceased donor kidney.  Patient stated that his donor had given his soul a chance to live, and he wants to thank his donor's family.  Patient was provided with supportive counseling, education, and handout about how to write a letter to his donor's family by hand or via [email or web address].   Patient was encouraged to Myers any forms (disability, medical leave, etc) to next clinic appointment for review / signature by  medical provider.   SW encouraged patient to call SW with any additional questions or concerns. All SW contact information was provided.    SW will continue to be available as needed and in collaboration with the treatment team.  Patient denied any other SW needs at this time.    Please contact with questions or concerns.       Enyla Lisbon, LCSW  Clinical Social Worker  Kidney [facility] Pan[facility] Transplant  Phone:  [phone]

## 2019-05-22 NOTE — Plan of Care (Signed)
Problem: Electrolyte Imbalance - Apheresis Hemodialysis Patient - Adult  Goal: Dialyzed with appropriate bath based on lab values  Description: Dialyzed with 1K 3Ca bath for K of 5.7  Outcome: Progress within 12 hours

## 2019-05-22 NOTE — H&P (Signed)
INTERVENTIONAL RADIOLOGY PRE-PROCEDURE NOTE       PLANNED PROCEDURE:  Fistulogram and declot    HISTORY OF PRESENT ILLNESS:   Cody Myers is a 43 y.o. 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 Patient had delayed graft function with hyperkalemia, so HD done 12/3, f/t/h near complete occlusion/thrombosis of the peripheral aspect of fistula.  IR was consulted for fistulogram and possible intervention/declot and tunneled dialysis catheter placement.    Past Medical History:   Diagnosis Date    Asthma     Inhalers    Back pain     Blood transfusion without reported diagnosis     BMI 35.0-35.9,adult     Height 6 foot even - weight 260 pounds for BMI 36.      Chronic kidney disease 2009    ESRD due FSGS / DM.    Need to clarify FSGS by biopsy.  Noted in evaluation with [facility] but no further documentation of FSGS by Nephrology.     Depression     not taking medication    GERD (gastroesophageal reflux disease)     Gout     Heart murmur     Hypercholesterolemia     Hypertension     2018 BP dropping on dialysis - MD ordered Midodrine 5 mg for SBD <100    Legally blind 2010    Conjunctiva disorder with prolapse right eye.  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    MRSA (methicillin resistant Staphylococcus aureus)     Sleep apnea     Thromboembolism (CMS code)     Dialysis graft only       Past Surgical History:   Procedure Laterality Date    AV FISTULA PLACEMENT Right 2016    BACK SURGERY      Bilateral Eye Surgery      CENTRAL VENOUS CATHETER  2015    Place and removed once fistula working    COLONOSCOPY  03/2018    Negative for polyps - no specimens taken    DIALYSIS FISTULA CREATION Left 2010    Revision 2014    TOOTH EXTRACTION  2018    Wound Vac  2015    Dehiscence anterior torso - chest       Allergies/Contraindications   Allergen Reactions    Aspirin      Other reaction(s): Other (Specify with Comments)  Bleeding ulcers  Has stomach  bleeding      Lactose      Other reaction(s): Other (Specify with Comments)        No current facility-administered medications on file prior to encounter.      Current Outpatient Medications on File Prior to Encounter   Medication Sig Dispense Refill    albuterol 90 mcg/actuation metered dose inhaler Inhale 1-2 puffs into the lungs every 6 (six) hours as needed for Wheezing or Shortness of Breath         minoxidiL (LONITEN) 10 mg tablet Take 10 mg by mouth daily as needed (DBP >100)         oxyCODONE-acetaminophen (PERCOCET) 10-325 mg tablet Take 1 tablet by mouth every 8 (eight) hours as needed for Pain (Lower back pain)         sucroferric oxyhydroxide (VELPHORO) 500 mg tablet Chew 1,000 mg by mouth 3 (three) times daily with meals         clonidine HCl (CLONIDINE ORAL) Take 0.1 mg  by mouth daily as needed (SBP >200)            PHYSICAL EXAM:  Gen: Somnolent but responds to voice.  Cardiac: Regular rate and rhythm  Pulmonary: Non-labor breathing  Abdomen: Nontender, nondistended  Extremity: Right forearm with aneurysmal fistula in place. No palpable pulsation and thrill in the peripheral aspect. No skin erythema or ooze.     VITAL SIGNS:   Vitals:    05/21/19 2328 05/22/19 0020 05/22/19 0509 05/22/19 0733   BP: 147/90  142/79 (!) 163/111   BP Location: Left upper arm  Left upper arm Left upper arm   Patient Position: Lying  Lying Sitting   Pulse: 88  75 79   Resp: 18  16 16    Temp: 37.1 C (98.8 F)  (!) 35.7 C (96.3 F) (!) 35.9 C (96.6 F)   TempSrc: Oral  Oral Oral   SpO2: 98%  100% 99%   Weight:  (!) 121.1 kg (266 lb 15.6 oz)     Height:           CBC, Creatinine:        05/22/19  0615 05/21/19  0533 05/21/19  0053   WBC 8.0 8.2 3.0*   HGB 11.7* 13.1* 13.1*   HCT 36.7* 41.5 43.0   PLT 184 169 146   CREAT 10.81* 11.72* 10.92*     Neutrophil Absolute Count (x10E9/L)   Date Value   05/22/2019 7.46 (H)         Liver Panel:        05/20/19  0418 05/16/19  2140 12/17/18  0510   AST 9 11  --    ALT 7* 9*  --     ALKP 80 86  --    TBILI 0.7 0.7  --    TP 7.7 7.7  --    ALB 3.9 4.0 4.6   GGT 13 14  --        Coags:        05/20/19  0418 05/16/19  2140   PTT 26.5 26.1   INR 1.1 1.0         Attestation of Consent    The attending physician has discussed the benefits, alternatives and risks associated with the proposed procedure with the patient, who understands and wants to proceed.  The risks include, but are not limited to bleeding, pain, distal embolization, unsuccessful attempts at fistula declot, infection.      Plan for sedation/Anesthesia    Plan for local anesthetic and moderate sedation discussed with associated complications.

## 2019-05-22 NOTE — Plan of Care (Signed)
Problem: Activity Intolerance - Kidney Transplant Patient - Adult  Goal: Able to perform physical activity as ordered  Outcome: Progress within 12 hours  Goal: Improved activity tolerance ( return to admit level or baseline )  Outcome: Progress within 12 hours     Problem: Fluid Volume, Imbalanced - Kidney Transplant Patient - Adult  Goal: Absence of fluid imbalance signs and symptoms  Outcome: Progress within 12 hours     Problem: Infection, at Risk and Actual - Kidney Transplant Patient - Adult  Goal: Prevention of infection  Outcome: Progress within 12 hours  Goal: Resolution of Infection  Outcome: Progress within 12 hours     Problem: Nutrition, Alteration in - Kidney Transplant Patient - Adult  Goal: Adequate nutritional intake  Outcome: Progress within 12 hours  Goal: Maximize nutritional intake per patient condition  Outcome: Progress within 12 hours

## 2019-05-22 NOTE — Assessment & Plan Note (Signed)
Plan for HD today

## 2019-05-23 ENCOUNTER — Inpatient Hospital Stay: Admit: 2019-05-23 | Discharge: 2019-05-23 | Payer: MEDICARE

## 2019-05-23 DIAGNOSIS — D649 Anemia, unspecified: Secondary | ICD-10-CM

## 2019-05-23 LAB — BASIC METABOLIC PANEL (NA, K,
Anion Gap: 15 — ABNORMAL HIGH (ref 4–14)
Calcium, total, Serum / Plasma: 7.9 mg/dL — ABNORMAL LOW (ref 8.4–10.5)
Carbon Dioxide, Total: 25 mmol/L (ref 22–29)
Chloride, Serum / Plasma: 94 mmol/L — ABNORMAL LOW (ref 101–110)
Creatinine: 10.3 mg/dL — ABNORMAL HIGH (ref 0.73–1.24)
Glucose, non-fasting: 113 mg/dL (ref 70–199)
Potassium, Serum / Plasma: 5.2 mmol/L — ABNORMAL HIGH (ref 3.5–5.0)
Sodium, Serum / Plasma: 134 mmol/L — ABNORMAL LOW (ref 135–145)
Urea Nitrogen, Serum / Plasma: 61 mg/dL — ABNORMAL HIGH (ref 7–25)
eGFR - high estimate: 6 mL/min — ABNORMAL LOW (ref >59)
eGFR - low estimate: 5 mL/min — ABNORMAL LOW (ref >59)

## 2019-05-23 LAB — COMPLETE BLOOD COUNT WITH DIFF
Abs Basophils: 0 10*9/L (ref 0.00–0.10)
Abs Eosinophils: 0 10*9/L (ref 0.00–0.40)
Abs Imm Granulocytes: 0.01 10*9/L (ref <0.10)
Abs Lymphocytes: 0.09 10*9/L — ABNORMAL LOW (ref 1.00–3.40)
Abs Monocytes: 0.12 10*9/L — ABNORMAL LOW (ref 0.20–0.80)
Abs Neutrophils: 2.59 10*9/L (ref 1.80–6.80)
Hematocrit: 37.6 % — ABNORMAL LOW (ref 41.0–53.0)
Hemoglobin: 12.2 g/dL — ABNORMAL LOW (ref 13.6–17.5)
MCH: 27.9 pg (ref 26.0–34.0)
MCHC: 32.4 g/dL (ref 31.0–36.0)
MCV: 86 fL (ref 80–100)
Platelet Count: 145 10*9/L (ref 140–450)
RBC Count: 4.37 10*12/L — ABNORMAL LOW (ref 4.40–5.90)
WBC Count: 2.8 10*9/L — ABNORMAL LOW (ref 3.4–10.0)

## 2019-05-23 LAB — MAGNESIUM, SERUM / PLASMA: Magnesium, Serum / Plasma: 2.1 mg/dL (ref 1.6–2.6)

## 2019-05-23 LAB — HTLV-I/II ANTIBODY: HTLV-I/II Antibody: NONREACTIVE

## 2019-05-23 LAB — PHOSPHORUS, SERUM / PLASMA: Phosphorus, Serum / Plasma: 6 mg/dL — ABNORMAL HIGH (ref 2.3–4.7)

## 2019-05-23 LAB — TACROLIMUS LEVEL: Tacrolimus: <1.5 ug/L — ABNORMAL LOW (ref 5.0–15.0)

## 2019-05-23 LAB — PROTEIN, TOTAL, RANDOM URINE
Prot Concentration,UR: 970 mg/dL
Protein/Creat Ratio, Urine: 11661 mg/g crea — ABNORMAL HIGH (ref <150)

## 2019-05-23 LAB — CREATININE, RANDOM URINE: Creatinine, Random Urine: 83.18 mg/dL

## 2019-05-23 MED ORDER — DIPHENHYDRAMINE 50 MG/ML INJECTION SOLUTION: 50 mg/mL | INTRAMUSCULAR | Status: AC

## 2019-05-23 MED ORDER — FUROSEMIDE 10 MG/ML INJECTION SOLUTION
10 mg/mL | INTRAMUSCULAR | Status: AC
  Administered 2019-05-23: 18:00:00 via INTRAVENOUS

## 2019-05-23 MED ORDER — SEVELAMER CARBONATE 800 MG TABLET
800 mg | ORAL | Status: DC
  Administered 2019-05-24 – 2019-05-26 (×8): via ORAL

## 2019-05-23 MED ORDER — DIPHENHYDRAMINE 25 MG CAPSULE
25 mg | ORAL | Status: AC
  Administered 2019-05-24: 07:00:00 via ORAL

## 2019-05-23 MED ORDER — AMLODIPINE 5 MG TABLET
5 mg | ORAL | Status: AC
  Administered 2019-05-23: 19:00:00 via ORAL

## 2019-05-23 MED ORDER — ACETAMINOPHEN 325 MG TABLET
325 mg | ORAL | Status: AC
  Administered 2019-05-24: 07:00:00 via ORAL

## 2019-05-23 MED ORDER — CARVEDILOL 6.25 MG TABLET: 6.25 mg | ORAL | Status: DC

## 2019-05-23 MED ORDER — SODIUM CHLORIDE 0.9 % DIALYSIS CIRCUIT
0.9 | Freq: Once | INTRAVENOUS | Status: AC
Start: 2019-05-23 — End: 2019-05-23
  Administered 2019-05-23: 21:00:00 200 mL via INTRAVENOUS

## 2019-05-23 MED ORDER — AMLODIPINE 10 MG TABLET
10 mg | ORAL | Status: DC
  Administered 2019-05-24: 17:00:00 via ORAL

## 2019-05-23 MED ORDER — HEPARIN (PORCINE) 1,000 UNIT/ML INJECTION SOLUTION
1000 | INTRAMUSCULAR | Status: DC | PRN
Start: 2019-05-23 — End: 2019-05-26
  Administered 2019-05-24 – 2019-05-26 (×2)

## 2019-05-23 MED ORDER — SODIUM CHLORIDE 0.9 % INTRAVENOUS SOLUTION
0.9 % | INTRAVENOUS | Status: AC
  Administered 2019-05-24: 08:00:00 via INTRAVENOUS

## 2019-05-23 MED ORDER — SODIUM CHLORIDE 0.9 % DIALYSIS CIRCUIT
0.9 | INTRAVENOUS | Status: DC | PRN
Start: 2019-05-23 — End: 2019-05-23

## 2019-05-23 MED ORDER — TACROLIMUS 1 MG CAPSULE, IMMEDIATE-RELEASE
1 mg | ORAL | Status: AC
  Administered 2019-05-23: 23:00:00 via ORAL

## 2019-05-23 MED ORDER — TACROLIMUS 1 MG CAPSULE, IMMEDIATE-RELEASE
1 mg | ORAL | Status: DC
  Administered 2019-05-24 (×2): via ORAL

## 2019-05-23 MED ORDER — PREDNISONE 50 MG TABLET
50 | Freq: Once | ORAL | Status: AC
Start: 2019-05-23 — End: 2019-05-23
  Administered 2019-05-24: 06:00:00 via ORAL

## 2019-05-23 MED ORDER — SODIUM CHLORIDE 0.9 % IV BOLUS
0.9 | INTRAVENOUS | Status: DC | PRN
Start: 2019-05-23 — End: 2019-05-23

## 2019-05-23 MED ORDER — SODIUM CHLORIDE 0.9 % DIALYSIS CIRCUIT
0.9 | Freq: Once | INTRAVENOUS | Status: AC
Start: 2019-05-23 — End: 2019-05-23
  Administered 2019-05-24: 200 mL via INTRAVENOUS

## 2019-05-23 MED FILL — MYCOPHENOLATE MOFETIL 250 MG CAPSULE: 250 mg | ORAL | Qty: 2

## 2019-05-23 MED FILL — FUROSEMIDE 10 MG/ML INJECTION SOLUTION: 10 mg/mL | INTRAMUSCULAR | Qty: 8

## 2019-05-23 MED FILL — HEPARIN (PORCINE) 1,000 UNIT/ML INJECTION SOLUTION: 1000 unit/mL | INTRAMUSCULAR | Qty: 10

## 2019-05-23 MED FILL — CLONIDINE HCL 0.1 MG TABLET: 0.1 mg | ORAL | Qty: 1

## 2019-05-23 MED FILL — SENNA LAX 8.6 MG TABLET: 8.6 mg | ORAL | Qty: 2

## 2019-05-23 MED FILL — PROGRAF 1 MG CAPSULE: 1 mg | ORAL | Qty: 2

## 2019-05-23 MED FILL — AMLODIPINE 5 MG TABLET: 5 mg | ORAL | Qty: 1

## 2019-05-23 MED FILL — DOCUSATE SODIUM 250 MG CAPSULE: 250 mg | ORAL | Qty: 1

## 2019-05-23 MED FILL — SULFAMETHOXAZOLE 800 MG-TRIMETHOPRIM 160 MG TABLET: 800-160 mg | ORAL | Qty: 1

## 2019-05-23 MED FILL — LANSOPRAZOLE 30 MG CAPSULE,DELAYED RELEASE: 30 mg | ORAL | Qty: 1

## 2019-05-23 MED FILL — THYMOGLOBULIN 25 MG INTRAVENOUS SOLUTION: 25 mg | INTRAVENOUS | Qty: 35

## 2019-05-23 MED FILL — PROGRAF 1 MG CAPSULE: 1 mg | ORAL | Qty: 1

## 2019-05-23 MED FILL — PREDNISONE 10 MG TABLET: 10 mg | ORAL | Qty: 1

## 2019-05-23 MED FILL — ACETAMINOPHEN 325 MG TABLET: 325 mg | ORAL | Qty: 2

## 2019-05-23 MED FILL — XYLOCAINE-MPF 10 MG/ML (1 %) INJECTION SOLUTION: 10 mg/mL (1 %) | INTRAMUSCULAR | Qty: 30

## 2019-05-23 NOTE — Progress Notes (Signed)
[facility] PROGRESS / IMMUNOSUPRESSION NOTE     Chief Complaint / Events  POD 3 following DDRT - DGF  Fistula clotted - unable to open - tunneled line placed    Vitals  Temp:  [35.7 C (96.3 F)-36.8 C (98.2 F)] 36.8 C (98.2 F)  Heart Rate:  [55-111] 76  *Resp:  [8-24] 16  BP: (125-200)/(66-111) 155/108  SpO2:  [95 %-100 %] 100 %    Input / Output  I/O last 3 completed shifts:  In: 1800 [P.O.:900; IV Piggyback:500]  Out: 2585 [Urine:185]  No intake/output data recorded.  12/04 1901 - 12/05 1900  In: 2119 [P.O.:340]  Out: 4174 [Urine:115]    Wt Readings from Last 1 Encounters:   05/23/19 (!) 119.7 kg (263 lb 14.3 oz)       Physical Exam  Physical Exam  HEENT: sclera anicteric, oral mucosa  Moist  Cardiac: regular rate and rhythm  Lungs: respiratory effort normal  Abdominal Exam: abdomen non distended. Surgical incision clean, dry, and intact without evidence of infection or hernia defects  Extremities: well perfused, no peripheral edema  Neurologic: alert and oriented, grossly intact  Psychiatric: mood and affect appropriate  Scheduled Meds:   0.9% sodium chloride flush  3 mL Intravenous Q8H [place]    acetaminophen  1,000 mg Oral Q8H [place]    acetaminophen  650 mg Oral Once    amLODIPine  5 mg Oral Daily [place]    antithymocyte glob rabbit (THYMOGLOBULIN) IVPB (adult periph IV)  175 mg Intravenous Once    diphenhydrAMINE  25 mg Oral Once    Or    diphenhydrAMINE  25 mg Intravenous Once    docusate sodium  250 mg Oral BID SCH    fluconazole  100 mg Oral Q7 Days SCH    furosemide  80 mg Intravenous Once    lansoprazole  30 mg Oral BID [place]    methylPREDNISolone  125 mg Intravenous Once    mycophenolate  500 mg Oral BID [place]    [START ON 05/24/2019] predniSONE  30 mg Oral Daily [place]    predniSONE  60 mg Oral Once    senna  17.2 mg Oral Daily At Bedtime [facility]    sulfamethoxazole-trimethoprim  1 tablet Oral Daily [place]    tacrolimus  2 mg Oral BID KTU/LTU    [START ON 05/25/2019] valGANciclovir  450 mg Oral Twice Weekly  [place]     Continuous Infusions:  PRN Meds:   0.9% sodium chloride flush  3 mL Intravenous PRN    cloNIDine HCL  0.1 mg Oral BID PRN    lidocaine  1 patch Topical Daily PRN    opium-belladonna  60 mg Rectal Q8H PRN    oxyCODONE  5-10 mg Oral Q4H PRN    sodium citrate  1-6 mL Intracatheter PRN       Data    Recent Labs     05/23/19  0509 05/22/19  0615 05/21/19  0533 05/21/19  0053   WBC 2.8* 8.0 8.2 3.0*   HGB 12.2* 11.7* 13.1* 13.1*   HCT 37.6* 36.7* 41.5 43.0   PLT 145 184 169 146   NA 134* 135 137 141   K 5.2* 5.7* 6.4* 4.9   CL 94* 94* 97* 98*   CO2 25 24 23 24    BUN 61* 56* 48* 45*   CREAT 10.30* 10.81* 11.72* 10.92*   GLU 113 104 123 106   CA 7.9* 7.7* 7.5* 7.6*   MG 2.1 1.9  1.8 2.0   PO4 6.0* 5.8* 4.4 4.5       Microbiology Results (last 24 hours)     Procedure Component Value Units Date/Time    Urine Culture [[id number]] Collected: 05/20/19 1911    Order Status: Completed Specimen: Urine, Midstream Updated: 05/22/19 8242     Comments Collected during surgical procedure.  CALL: [id number]       Bacterial Culture, Urine w/o gram stain No growth 2 days.          Radiology Results   US Renal Transplant    Result Date: 05/22/2019  1. Right lower quadrant renal transplant with patent vasculature and no hydronephrosis. 2. Scan postoperative fluid collections. 3. The proximal portion of the ureteral stent appears well positioned in the collecting system which is decompressed. The distal aspect is not definitively visualized due to the decompressed bladder. Report dictated by: Rozetta Nunnery, MD, signed by: Cordelia Poche, MD Department of Radiology and Biomedical Imaging     Ir Dialysis Fistulagram    Result Date: 05/23/2019  1.  Near complete occlusion of the right radial-cephalic fistula central to the most peripheral aneurysmal dilation. The fistula was declotted using pharmaco-mechanial thrombolsysis. However, adequate flow within the fistula could not restored despite multiple angioplasties to 7 mm, balloon  sweeps, and placement of 8 mm by 50 mm and 7 mm by 65mm covered Viabahn stents. Thus decision was made to place a tunneled dialysis catheter. 2.  Insertion of right-sided tunneled dialysis catheter into the external jugular vein, with tip in the expected location of the cavoatrial junction. Attestation Signer name: Hinda Kehr, MD I attest that I was present for the entire procedure. I agree with the report as written. No unintentionally retained devices were noted upon review of the images. Report dictated by: Verne Spurr, MD PhD, signed by: Hinda Kehr, MD Department of Radiology and Biomedical Imaging     Ir Indwelling Venous Catheter Placement    Result Date: 05/23/2019  1.  Near complete occlusion of the right radial-cephalic fistula central to the most peripheral aneurysmal dilation. The fistula was declotted using pharmaco-mechanial thrombolsysis. However, adequate flow within the fistula could not restored despite multiple angioplasties to 7 mm, balloon sweeps, and placement of 8 mm by 50 mm and 7 mm by 58mm covered Viabahn stents. Thus decision was made to place a tunneled dialysis catheter. 2.  Insertion of right-sided tunneled dialysis catheter into the external jugular vein, with tip in the expected location of the cavoatrial junction. Attestation Signer name: Hinda Kehr, MD I attest that I was present for the entire procedure. I agree with the report as written. No unintentionally retained devices were noted upon review of the images. Report dictated by: Verne Spurr, MD PhD, signed by: Hinda Kehr, MD Department of Radiology and Biomedical Imaging          Assessment & Plan  HTN (hypertension)  BP on the high side this morning.    Restarting clonidine 0.1mg  BID.  Will add on nifedipine 30mg  if needed as well.    Legally blind  Has son helping out for care.    Anemia of chronic renal failure  Hgb at target currently.    Monitor daily.    Chronic kidney disease-mineral and bone  disorder  Will monitor calcium and phos daily.    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    HTN (hypertension)  BP elevation in PACU immediately post-op. 0.1 clonidine given and BP well controlled now.     -  CTM BP    Deceased-donor kidney transplant recipient  S/p DDRT 05/20/19    Minimal urine output since transplant.  Has DGF    HD today for hyperkalemia.  IR unable to declot AVF.  Plan to HD using TDC (IR to place today).    Immunosuppressive management encounter following kidney transplant  Thymo induction    Plan for Tac/MMF/pred maintenance     Starting tac at 2mg  BID.  Monitor tac trough daily    At risk for opportunistic infections  Bactrim DS daily until 06/19/19 then MWFs until 05/19/20 (Toxo mismatch)  Valcyte renally dosed until 08/18/19  Fluconazole weekly until 06/18/19    At risk of infection transmitted from donor  No known risk from donor    Delayed graft function of kidney  Needed dialysis 05/21/19 after his transplant on 05/20/19 for hyperkalemia.      AV fistula occlusion (CMS code)  IR was unsuccessful opening up AVF on 12/4    Plan to place [facility].    Hyperkalemia  Plan for HD today        Nutrition: Patient well nourished, no nutritional consult needed.    Pharmacy: Current medications were reviewed and discussed with patientby pharmacy.    Severity of Illness  Drug therapy requiring intensive monitoring for toxicity    Code Status: FULL     Lubertha BasquePeter G. Ryan Ogborn, MD  05/23/2019

## 2019-05-23 NOTE — Plan of Care (Signed)
Potassium 5.2. bath 2k/2.5

## 2019-05-23 NOTE — Progress Notes (Signed)
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   Had [facility] and dialysis for 2 hours yesterday evening.     Subjective  Feeling not too bad.  Reports no chest pain or shortness of breath.  Tolerated HD fine yesterday.  Eating well.     Vitals  Temp:  [35.7 C (96.3 F)-36.8 C (98.2 F)] 36.6 C (97.9 F)  Heart Rate:  [63-111] 73  *Resp:  [16-18] 18  BP: (118-199)/(62-111) 129/67  SpO2:  [97 %-100 %] 98 %      Intake/Output Summary (Last 24 hours) at 05/23/2019 1835  Last data filed at 05/23/2019 1628  Gross per 24 hour   Intake 1800 ml   Output 3870 ml   Net -2070 ml       Physical Exam  General:Chronically ill appearing. No acute distress.  HENT:Normocephalic and atraumatic.  BHA:LPFXT  Chest:Good inspiratory effort. Lungs clear to auscultation without use of accessory muscles.  Heart:Regular rate and rhythm. No rubs, murmurs or gallops.  Abdomen:Nontender, nondistended without hepatosplenomegaly or masses.  Musculoskeletal:Normal gait, tone, and mass. No clubbing or cyanosis. No edema.   Skin:No icterus, lesions, rashes or skin breakdown.  Psychiatric:Normal mood and affect.  Neurological:Cranial nerves grossly intact. Alert and oriented x 3. No asterixis. No tremor.  Vascular:RUE AVF for access.  No bruit. RIJ TDC- c/d/i.   GU:  Allograft is non-tender.     Data    CBC        05/23/19  0509 05/22/19  0615 05/21/19  0533   WBC 2.8* 8.0 8.2   HGB 12.2* 11.7* 13.1*   HCT 37.6* 36.7* 41.5   PLT 145 184 169   MCV 86 87 89       Chem7        05/23/19  0509 05/22/19  0615 05/21/19  0533   NA 134* 135 137   K 5.2* 5.7* 6.4*   CL 94* 94* 97*   CO2 '25 24 23   '$ BUN 61* 56* 48*    CREAT 10.30* 10.81* 11.72*   GLU 113 104 123       Electrolytes        05/23/19  0509 05/22/19  0615 05/21/19  0533   CA 7.9* 7.7* 7.5*   MG 2.1 1.9 1.8   PO4 6.0* 5.8* 4.4       Blood Gas  No results found in last 72 hours    Liver Panel  No results found in last 72 hours    Coags  No results found in last 72 hours    Immunosuppression        05/23/19  0509   TAC <1.5*         Microbiology Results (last 24 hours)     ** No results found for the last 24 hours. **          Radiology Results  No results found.         Patient Active Problem List    Diagnosis Date Noted    Deceased-donor kidney transplant recipient 05/21/2019     Priority:  01.     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     Transplant Serology Workup:  Recipient        Lab Results   Component Value Date    HBCAB NEG 12/17/2018    HBSAG NEG 12/17/2018    HBABQ >800 12/17/2018    HCV NEG 12/17/2018    RPR Nonreactive 12/17/2018    CMVAB POS (A) 12/17/2018    VCAM NEG 12/17/2018    EBNA POS (A) 12/17/2018    TOXO NEG 12/17/2018         Donor        [facility] Donor UNOS ID (no units)   Date Value   05/20/2019 [id number]     Match ID: [id number]          Induction Plan:   Thymo + steroid maintenance     Plan for PPX:   Toxo prophylaxis: (D +/R - Toxo MISMATCH), Septra DS 1 tab PO Daily x 1 month (06/19/2019), then qMWF x 11 months more (05/19/2020) -- 1 year total for Toxo Mismatch   PCP prophylaxis: Septra DS as per above for Toxo ppx    CMV prophylaxis: (D +/R +), Valcyte 900 mg PO Daily (adjusted for renal function) x 3 months (08/18/2019)   Fungal prophylaxis: Fluconazole 100 mg PO q7days x 1 month (06/18/2019)  HBV prophylaxis: Not necessary            At risk of infection transmitted from donor 05/21/2019     Priority: 03.     Patient underwent transplant with no known donor derived risk factors: Donor is not CDC high risk, not HBV core ab + and there are no known donor derived infections. No need for treatment or monitoring beyond the usual opportunistic infection prophylaxis per protocol. Donor with no known malignancies.  The donor's CMV status is IgG positive and the recipient's CMV status is IgG positive.  Prophylaxis will be determined by risk of CMV transmission.                Immunosuppressive management encounter following kidney transplant 05/21/2019     Priority: 04.     Induction agent/dose: Thymo lite  Regime: Tac/MMF  Corticosteroid plan: maintenance   cPRA: 10%    Class I:        Lab Results   Component Value Date    Strong A:31 33 12/17/2018    Comments Unacceptables:(A:31 33) 12/17/2018    Tested Date 12/24/2018 12/17/2018         Class II:         Lab Results   Component Value Date    Comments  12/17/2018     No HLA Class II antibodies were detected in this serum sample. Unacceptables:()    Tested Date 12/24/2018 12/17/2018           AV fistula occlusion (CMS code) 05/22/2019    Hyperkalemia 05/22/2019    Transplant follow-up 05/21/2019     Category Problem Type of follow-up needed in  clinic? Ordered? Scheduled? Comments   Nursing (e.g., drain/ Foley) NA NA NA None   Lab/ Micro Standard Tac Follow up results   Needs to be ordered None   Imaging studies/ procedures NA NA NA None   Referrals Uroglogy for stent removal NA Already ordered None           Ureteral stent retained of kidney transplant (CMS code) 05/21/2019     Implant Name Type Inv. Item Serial No. Manufacturer Lot No. LRB No. Used Action    STENT URETHRAL 6FR X 16CM DOUBLE J   [id number] - [id number] Stents-Non Des STENT URETHRAL 6FR X 16CM DOUBLE J   [id number]   [id number] Right 1 Implanted           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    Anemia of chronic renal failure 05/20/2019    Chronic kidney disease-mineral and bone disorder 05/20/2019    ESRD (end stage renal disease) on dialysis (CMS code) 05/16/2019    Pre-transplant evaluation for kidney transplant 04/14/2019     ESRD d/t: Focal Glomerular Sclerosis (Focal Segmental - Fsg), Hypertensive Nephrosclerosis    Listing Date: 12/30/2018  Accumulated List Date: 09/10/2008  Blood Type: AB  UNOS Points: 10.6  cPRA: 10.581%  (as of 12/17/2018) 69m (national): 1% (local): 0%  EPTS: 24%  Days since Last Serum: 116 SA Serum Past Due  Days since Last cPRA: 116      At increased risk for cardiovascular disease 04/12/2019     MYOCARDIAL PERFUSION SCAN (11/20/2018)  1. The pharmacological perfusion study is negative for ischemia.  2. There is not previous study available.  3. The global LV systolic function is normal.  4. Further evaluation with medical management is necessary at this time.  **Post-stress gated SPECT images demonstrated normal contractility with a calculated LVEF of 61%    ECHO TTE (03/05/2019)  1. Normal left ventricular size with moderate concentric left ventricular hypertrophy. EF 65%  2. grade 2 pseudo-normal diastolic LV dysfunction is present.  3. Overall left ventricular systolic function is grossly normal.  4. Mildly enlarged left atrium.  5. The aortic valve is structurally and functionally normal.  6. Mild thickened and/or calcified mitral leaflets.  7. There is trace mitral regurgitation.  8. Mild tricuspid insufficiency with normal estimated right heart pressure. RVSP 19.00 mmHg      EKG (12/23/2018)  Sinus rhythm  Consider inferior infarct    HOLTER MONITOR (10/22/2018)  IMPRESSION:       This  24-hour  Holter  monitor  is  suggestive of  sinus  rhythm with  sinus tachycardia.      There were  PVCs and PACs. There were no  sustained  arrhythmias seen.              Health care maintenance 04/12/2019     COLONOSCOPY (04/02/2018)  Melanosis in the colon.  No specimens collected.          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  Been on HD since 2010.  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  Problem-based Assessment and Plan    * Deceased-donor kidney transplant recipient  Assessment & Plan  S/p DDRT 05/20/19  Has DGF. Minimal urine output since transplant.  AVF thrombosed on 12/4. IR unable to declot and now s/p RIJ TDC on 05/22/2019.   Had 2 hours dialysis on 12/4 for high K. His usual HD duration is 4.5H.     K 5.2 today. Will plan for further HD today.     At risk for opportunistic infections  Assessment & Plan  Bactrim DS daily until 06/19/19 then MWFs until 05/19/20 (Toxo mismatch)  Valcyte renally dosed until 08/18/19  Fluconazole weekly until 06/18/19    At risk of infection transmitted from donor  [place]  Not a PHS increased risk donor and therefore does not require additional monitoring per increased risk donor protocol.       Immunosuppressive management encounter following kidney transplant  Assessment & Plan  Thymo induction  Continue tac and check daily level.  MMF '500mg'$  BID until thymo completed.   Pred maintenance     Hyperkalemia  Assessment & Plan  Plan for HD today    AV fistula occlusion (CMS code)  Assessment & Plan  Unsuccessful declot on 12/4 .  TDC inserted on 12/4.     Delayed graft function of kidney  Assessment & Plan   Needed dialysis 05/21/19 after his transplant on 05/20/19 for hyperkalemia.      Chronic kidney disease-mineral and bone disorder  Assessment & Plan  Calcium, total, Serum / Plasma (mg/dL)   Date Value   05/23/2019 7.9 (L)     Phosphorus, Serum / Plasma (mg/dL)   Date Value   05/23/2019 6.0 (H)     Albumin, Serum / Plasma (g/dL)   Date Value   05/20/2019 3.9    Prior to transplant on velphoro.   Low calcium will improve with dialysis.   On Velphoro normally at home for phos binder; on hold.   Will monitor calcium and phos daily.     Anemia of chronic renal failure  Assessment & Plan  Lab Results   Component Value Date    Hemoglobin 12.2 (L) 05/23/2019      Hgb at target currently.  No need for EPO.   Monitor daily.    Legally blind  Assessment & Plan  Has son helping out for care.    HTN (hypertension)  Assessment & Plan  Prior to transplant on minoxidil '10mg'$  and clonidine 0.1 PRN.   BP on the high side.    Increase amlodipine to '10mg'$ .   Add carvedilol 6.'25mg'$  BID later if BP remains elevated.      Code Status: FULL    Jodelle Gross  MB Laren Boom, [place]  Transplant Nephrology Fellow  05/23/19

## 2019-05-23 NOTE — Nursing Note (Signed)
Pre Treatment Nursing Note  Pre treatment report received from Shanon Brow, RN  Pre treatment vitals: Wt 119.7 kg via Bed scale, BP 159/84, Pulse 75.  3 hours HD started in AHU via Tunneled Catheter QB 350 ml/min, ordered UF 0.5-1 L as tolerated. Normal saline 200 ml prime.    Medication Administration:  Aranesp: no mcg IVP  Zemplar: no mcg IVP  Blood Transfusion: No   After HD MAR reviewed and After HD MAR: no medications due after HD    Post Treatment Nursing Note  Post tx vitals: Wt 119 kg via Bed scale, BP 129/67, Pulse 73.   Pt tolerated 3.5 hr treatment with net UF 955 ml  fluid removed. Catheter site clean, dry and intact, dressing & Tegos last changed 05/22/19. Treatment orders were not adjusted during the run.   There were no adverse reactions/complications during this procedure. Post treatment report given to: RN covering for [employer], [employer]

## 2019-05-24 DIAGNOSIS — T82898A Other specified complication of vascular prosthetic devices, implants and grafts, initial encounter: Secondary | ICD-10-CM

## 2019-05-24 DIAGNOSIS — Z789 Other specified health status: Secondary | ICD-10-CM

## 2019-05-24 DIAGNOSIS — T8389XA Other specified complication of genitourinary prosthetic devices, implants and grafts, initial encounter: Secondary | ICD-10-CM

## 2019-05-24 LAB — BASIC METABOLIC PANEL (NA, K,
Anion Gap: 17 — ABNORMAL HIGH (ref 4–14)
Anion Gap: 19 — ABNORMAL HIGH (ref 4–14)
Calcium, total, Serum / Plasma: 7.7 mg/dL — ABNORMAL LOW (ref 8.4–10.5)
Calcium, total, Serum / Plasma: 7.8 mg/dL — ABNORMAL LOW (ref 8.4–10.5)
Carbon Dioxide, Total: 20 mmol/L — ABNORMAL LOW (ref 22–29)
Carbon Dioxide, Total: 17 mmol/L — ABNORMAL LOW (ref 22–29)
Chloride, Serum / Plasma: 98 mmol/L — ABNORMAL LOW (ref 101–110)
Chloride, Serum / Plasma: 98 mmol/L — ABNORMAL LOW (ref 101–110)
Creatinine: 8.77 mg/dL — ABNORMAL HIGH (ref 0.73–1.24)
Creatinine: 9.49 mg/dL — ABNORMAL HIGH (ref 0.73–1.24)
Glucose, non-fasting: 101 mg/dL (ref 70–199)
Glucose, non-fasting: 124 mg/dL (ref 70–199)
Potassium, Serum / Plasma: 5.4 mmol/L — ABNORMAL HIGH (ref 3.5–5.0)
Potassium, Serum / Plasma: 4.4 mmol/L (ref 3.5–5.0)
Sodium, Serum / Plasma: 135 mmol/L (ref 135–145)
Sodium, Serum / Plasma: 134 mmol/L — ABNORMAL LOW (ref 135–145)
Urea Nitrogen, Serum / Plasma: 56 mg/dL — ABNORMAL HIGH (ref 7–25)
Urea Nitrogen, Serum / Plasma: 67 mg/dL — ABNORMAL HIGH (ref 7–25)
eGFR - high estimate: 8 mL/min — ABNORMAL LOW (ref >59)
eGFR - high estimate: 7 mL/min — ABNORMAL LOW (ref >59)
eGFR - low estimate: 7 mL/min — ABNORMAL LOW (ref >59)
eGFR - low estimate: 6 mL/min — ABNORMAL LOW (ref >59)

## 2019-05-24 LAB — PHOSPHORUS, SERUM / PLASMA: Phosphorus, Serum / Plasma: 4.7 mg/dL (ref 2.3–4.7)

## 2019-05-24 LAB — COMPLETE BLOOD COUNT
Hematocrit: 36 % — ABNORMAL LOW (ref 41.0–53.0)
Hemoglobin: 11.8 g/dL — ABNORMAL LOW (ref 13.6–17.5)
MCH: 27.8 pg (ref 26.0–34.0)
MCHC: 32.8 g/dL (ref 31.0–36.0)
MCV: 85 fL (ref 80–100)
Platelet Count: 112 10*9/L — ABNORMAL LOW (ref 140–450)
RBC Count: 4.24 10*12/L — ABNORMAL LOW (ref 4.40–5.90)
WBC Count: 2.3 10*9/L — ABNORMAL LOW (ref 3.4–10.0)

## 2019-05-24 LAB — TYPE AND SCREEN
ABO/RH(D): AB NEG
Antibody Screen: NEGATIVE
Blood Product Expiration Date: 202012102359
Blood Product Expiration Date: 202012132359
Blood Product Issue Date/Time: 202012021814
Blood Product Issue Date/Time: 202012021814
UDIV: 0
UDIV: 0
Unit Type and Rh: 2800
Unit Type and Rh: 2800

## 2019-05-24 LAB — PROTEIN, TOTAL, RANDOM URINE
Prot Concentration,UR: 1336 mg/dL
Protein/Creat Ratio, Urine: 18105 mg/g crea — ABNORMAL HIGH (ref <150)

## 2019-05-24 LAB — TACROLIMUS LEVEL: Tacrolimus: 2.4 ug/L — ABNORMAL LOW (ref 5.0–15.0)

## 2019-05-24 LAB — CREATININE, RANDOM URINE: Creatinine, Random Urine: 73.79 mg/dL

## 2019-05-24 LAB — MAGNESIUM, SERUM / PLASMA: Magnesium, Serum / Plasma: 2 mg/dL (ref 1.6–2.6)

## 2019-05-24 MED ORDER — MYCOPHENOLATE MOFETIL 250 MG CAPSULE
250 mg | ORAL | Status: DC
  Administered 2019-05-25 – 2019-05-26 (×4): via ORAL

## 2019-05-24 MED ORDER — TACROLIMUS 1 MG CAPSULE, IMMEDIATE-RELEASE
1 mg | Freq: Two times a day (BID) | ORAL | Status: DC
Start: 2019-05-24 — End: 2019-05-25
  Administered 2019-05-25: 02:00:00 4 mg via ORAL
  Administered 2019-05-25: 17:00:00 via ORAL

## 2019-05-24 MED ORDER — MYCOPHENOLATE MOFETIL 250 MG CAPSULE
250 mg | ORAL | Status: AC
  Administered 2019-05-24: 19:00:00 via ORAL

## 2019-05-24 MED ORDER — TACROLIMUS 1 MG CAPSULE
1 | Freq: Once | ORAL | Status: AC
Start: 2019-05-24 — End: 2019-05-24
  Administered 2019-05-24: 22:00:00 via ORAL

## 2019-05-24 MED ORDER — MYCOPHENOLATE MOFETIL 500 MG TABLET: 500 mg | ORAL | Status: DC

## 2019-05-24 MED ORDER — FUROSEMIDE 10 MG/ML INJECTION SOLUTION
10 mg/mL | INTRAMUSCULAR | Status: AC
  Administered 2019-05-24: 19:00:00 via INTRAVENOUS

## 2019-05-24 MED FILL — LANSOPRAZOLE 30 MG CAPSULE,DELAYED RELEASE: 30 mg | ORAL | Qty: 1

## 2019-05-24 MED FILL — ACETAMINOPHEN 500 MG TABLET: 500 mg | ORAL | Qty: 2

## 2019-05-24 MED FILL — DOCUSATE SODIUM 250 MG CAPSULE: 250 mg | ORAL | Qty: 1

## 2019-05-24 MED FILL — PROGRAF 1 MG CAPSULE: 1 mg | ORAL | Qty: 3

## 2019-05-24 MED FILL — MYCOPHENOLATE MOFETIL 250 MG CAPSULE: 250 mg | ORAL | Qty: 2

## 2019-05-24 MED FILL — PREDNISONE 10 MG TABLET: 10 mg | ORAL | Qty: 1

## 2019-05-24 MED FILL — MYCOPHENOLATE MOFETIL 500 MG TABLET: 500 mg | ORAL | Qty: 1

## 2019-05-24 MED FILL — SEVELAMER CARBONATE 800 MG TABLET: 800 mg | ORAL | Qty: 1

## 2019-05-24 MED FILL — DIPHENHYDRAMINE 25 MG CAPSULE: 25 mg | ORAL | Qty: 1

## 2019-05-24 MED FILL — FUROSEMIDE 10 MG/ML INJECTION SOLUTION: 10 mg/mL | INTRAMUSCULAR | Qty: 8

## 2019-05-24 MED FILL — OXYCODONE 5 MG TABLET: 5 mg | ORAL | Qty: 2

## 2019-05-24 MED FILL — ACETAMINOPHEN 325 MG TABLET: 325 mg | ORAL | Qty: 2

## 2019-05-24 MED FILL — PROGRAF 1 MG CAPSULE: 1 mg | ORAL | Qty: 1

## 2019-05-24 MED FILL — THYMOGLOBULIN 25 MG INTRAVENOUS SOLUTION: 25 mg | INTRAVENOUS | Qty: 35

## 2019-05-24 MED FILL — SENNA LAX 8.6 MG TABLET: 8.6 mg | ORAL | Qty: 2

## 2019-05-24 MED FILL — AMLODIPINE 10 MG TABLET: 10 mg | ORAL | Qty: 1

## 2019-05-24 MED FILL — SULFAMETHOXAZOLE 800 MG-TRIMETHOPRIM 160 MG TABLET: 800-160 mg | ORAL | Qty: 1

## 2019-05-24 NOTE — Plan of Care (Signed)
Problem: Discharge Planning - Adult  Goal: Knowledge of and participation in plan of care  Outcome: Progress within 12 hours     Problem: Anxiety, Patient / Family / Caregiver - Kidney Transplant Patient - Adult  Goal: Alleviation of anxiety (Patient / Family / Caregiver)  Outcome: Progress within 12 hours

## 2019-05-24 NOTE — Consults (Signed)
PHYSICAL THERAPY CASE CONFERENCE NOTE     Charting Type: Case Conference    Orders received for PT eval and treat. Per discussion with patients nurse, patient has been ambulating independently in his room and with supervision assist in the hallway d/t patient legally blind at baseline. Per discussion with nurse and chart review, patient currently presents at baseline for functional mobility with no acute functional mobility impairments. Therefore, PT intervention not warrant at this time and orders will be discontinued. Recommend continuing to mobilize with nursing staff.      Leroy Sea, PT  05/24/2019

## 2019-05-24 NOTE — Plan of Care (Signed)
Problem: Activity Intolerance - Kidney Transplant Patient - Adult  Goal: Able to perform physical activity as ordered  Outcome: Progress within 12 hours  Goal: Improved activity tolerance ( return to admit level or baseline )  Outcome: Progress within 12 hours     Problem: Gas Exchange, Impaired- Kidney Transplant Patient - Adult  Goal: Adequate oxygenation (absence of signs and symptoms of hypoxemia)  Outcome: Progress within 12 hours     Problem: GU Elimination Impaired - Kidney Transplant Patient - Adult  Goal: Able to void spontaneously  Outcome: Progress within 12 hours

## 2019-05-24 NOTE — Progress Notes (Signed)
[place] / IMMUNOSUPRESSION NOTE     Chief Complaint / Events  POD 4 following DDRT complicated by DGF.     Vitals  Temp:  [36 C (96.8 F)-36.6 C (97.9 F)] 36.6 C (97.8 F)  Heart Rate:  [68-88] 88  *Resp:  [16-18] 16  BP: (124-147)/(67-90) 147/89  SpO2:  [95 %-100 %] 98 %    Input / Output  I/O last 3 completed shifts:  In: 2300 [P.O.:700; IV Piggyback:1000]  Out: 3080 [Urine:125]  I/O this shift:  In: 480 [P.O.:480]  Out: 50 [Urine:50]  12/05 1901 - 12/06 1900  In: 980 [P.O.:480]  Out: 57 [Urine:60]    Wt Readings from Last 1 Encounters:   05/24/19 (!) 120.6 kg (265 lb 14 oz)       Physical Exam  Physical Exam  HEENT: sclera anicteric, oral mucosa  Moist  Cardiac: regular rate and rhythm  Lungs: respiratory effort normal  Abdominal Exam: abdomen non distended. Surgical incision clean, dry, and intact without evidence of infection or hernia defects  Extremities: well perfused, no peripheral edema  Neurologic: alert and oriented, grossly intact  Psychiatric: mood and affect appropriate  Scheduled Meds:   0.9% sodium chloride flush  3 mL Intravenous Q8H [place]    acetaminophen  1,000 mg Oral Q8H [place]    amLODIPine  10 mg Oral Daily [place]    docusate sodium  250 mg Oral BID [place]    fluconazole  100 mg Oral Q7 Days [place]    lansoprazole  30 mg Oral BID [place]    methylPREDNISolone  125 mg Intravenous Once    mycophenolate  1,000 mg Oral BID [place]    predniSONE  30 mg Oral Daily [place]    senna  17.2 mg Oral Daily At Bedtime [facility]    sevelamer carbonate  800 mg Oral TID WC [place]    sulfamethoxazole-trimethoprim  1 tablet Oral Daily [place]    tacrolimus  4 mg Oral BID KTU/LTU    [START ON 05/25/2019] valGANciclovir  450 mg Oral Twice Weekly [place]     Continuous Infusions:  PRN Meds:   0.9% sodium chloride flush  3 mL Intravenous PRN    cloNIDine HCL  0.1 mg Oral BID PRN    heparin  1,000-6,000 Units Intracatheter PRN    lidocaine  1 patch Topical Daily PRN    opium-belladonna  60 mg Rectal Q8H PRN    oxyCODONE  5-10 mg  Oral Q4H PRN    sodium citrate  1-6 mL Intracatheter PRN       Data    Recent Labs     05/24/19  1401 05/24/19  0742 05/24/19  0552 05/23/19  0509 05/22/19  0615   WBC  --  2.3*  --  2.8* 8.0   HGB  --  11.8*  --  12.2* 11.7*   HCT  --  36.0*  --  37.6* 36.7*   PLT  --  112*  --  145 184   NA 134*  --  135 134* 135   K 4.4  --  5.4* 5.2* 5.7*   CL 98*  --  98* 94* 94*   CO2 17*  --  20* 25 24   BUN 67*  --  56* 61* 56*   CREAT 9.49*  --  8.77* 10.30* 10.81*   GLU 124  --  101 113 104   CA 7.8*  --  7.7* 7.9* 7.7*   MG  --   --  2.0 2.1 1.9   PO4  --   --  4.7 6.0* 5.8*   TAC  --   --  2.4* <1.5*  --        Microbiology Results (last 72 hours)     Procedure Component Value Units Date/Time    Urine Culture [[id number]] Collected: 05/20/19 1911    Order Status: Completed Specimen: Urine, Midstream Updated: 05/22/19 5974     Comments Collected during surgical procedure.  CALL: [id number]       Bacterial Culture, Urine w/o gram stain No growth 2 days.          Radiology Results  No results found.        Assessment & Plan  HTN (hypertension)  Prior to transplant on minoxidil 10mg  and clonidine 0.1 PRN.   BP on the high side.    Increase amlodipine to 10mg .   Add carvedilol 6.25mg  BID later if BP remains elevated.    Legally blind  Has son helping out for care.    Anemia of chronic renal failure  Lab Results   Component Value Date    Hemoglobin 12.2 (L) 05/23/2019      Hgb at target currently.  No need for EPO.   Monitor daily.    Chronic kidney disease-mineral and bone disorder  Calcium, total, Serum / Plasma (mg/dL)   Date Value   7.9 (L)     Phosphorus, Serum / Plasma (mg/dL)   Date Value   14/10/2018 6.0 (H)     Albumin, Serum / Plasma (g/dL)   Date Value   53/64/6803 3.9    Prior to transplant on velphoro.   Low calcium will improve with dialysis.   On Velphoro normally at home for phos binder; on hold.   Will monitor calcium and phos daily.     HTN (hypertension)  BP elevation in PACU immediately post-op. 0.1  clonidine given and BP well controlled now.     - CTM BP    Deceased-donor kidney transplant recipient  S/p DDRT 05/20/19  Has DGF. Minimal urine output since transplant.  AVF thrombosed on 12/4. IR unable to declot and now s/p RIJ TDC on 05/22/2019.   Had 2 hours dialysis on 12/4 for high K. His usual HD duration is 4.5H.     K 5.2 today. Will plan for further HD today.     Immunosuppressive management encounter following kidney transplant  Thymo induction  Continue tac and check daily level.  MMF 500mg  BID until thymo completed.   Pred maintenance     At risk for opportunistic infections  Bactrim DS daily until 06/19/19 then MWFs until 05/19/20 (Toxo mismatch)  Valcyte renally dosed until 08/18/19  Fluconazole weekly until 06/18/19    At risk of infection transmitted from donor  Not a PHS increased risk donor and therefore does not require additional monitoring per increased risk donor protocol.       Delayed graft function of kidney  Needed dialysis 05/21/19 after his transplant on 05/20/19 for hyperkalemia.      AV fistula occlusion (CMS code)  Unsuccessful declot on 12/4 .  TDC inserted on 12/4.     Hyperkalemia  Plan for HD today      Nutrition: Patient well nourished, no nutritional consult needed.    Pharmacy: Current medications were reviewed and discussed with patientby pharmacy.    Severity of Illness  Drug therapy requiring intensive monitoring for toxicity    Code Status: FULL  Lubertha BasquePeter G. Amber Guthridge, MD  05/24/2019

## 2019-05-24 NOTE — Progress Notes (Signed)
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     Subjective  Feeling not too bad.  Reports no chest pain or shortness of breath.  Tolerated HD fine yesterday.  Eating well.   His son normally helps him with medication at home.     Vitals  Temp:  [35.6 C (96.1 F)-36.6 C (97.9 F)] 35.6 C (96.1 F)  Heart Rate:  [75-88] 83  *Resp:  [16-18] 16  BP: (124-147)/(75-90) 145/79  SpO2:  [95 %-100 %] 100 %      Intake/Output Summary (Last 24 hours) at 05/24/2019 1824  Last data filed at 05/24/2019 1625  Gross per 24 hour   Intake 980 ml   Output 60 ml   Net 920 ml       Physical Exam  General:Chronically ill appearing. No acute distress.  HENT:Normocephalic and atraumatic.  HUD:JSHFW  Chest:Good inspiratory effort. Lungs clear to auscultation without use of accessory muscles.  Heart:Regular rate and rhythm. No rubs, murmurs or gallops.  Abdomen:Nontender, nondistended without hepatosplenomegaly or masses.  Musculoskeletal:Normal gait, tone, and mass. No clubbing or cyanosis. No edema.   Skin:No icterus, lesions, rashes or skin breakdown.  Psychiatric:Normal mood and affect.  Neurological:Cranial nerves grossly intact. Alert and oriented x 3. No asterixis. No tremor.  Vascular:RUE AVF (no bruit). RIJ TDC- c/d/i.   GU:  Allograft is non-tender.     Data    CBC        05/24/19  0742 05/23/19  0509 05/22/19  0615   WBC 2.3* 2.8* 8.0   HGB 11.8* 12.2* 11.7*   HCT 36.0* 37.6* 36.7*   PLT 112* 145 184   MCV 85 86 87       Chem7        05/24/19  1401 05/24/19  0552 05/23/19  0509   NA 134* 135 134*   K 4.4 5.4* 5.2*   CL 98* 98* 94*   CO2 17* 20* 25   BUN 67* 56* 61*    CREAT 9.49* 8.77* 10.30*   GLU 124 101 113       Electrolytes        05/24/19  1401 05/24/19  0552 05/23/19  0509 05/22/19  0615   CA 7.8* 7.7* 7.9* 7.7*   MG  --  2.0 2.1 1.9   PO4  --  4.7 6.0* 5.8*       Blood Gas  No results found in last 72 hours    Liver Panel  No results found in last 72 hours    Coags  No results found in last 72 hours    Immunosuppression        05/24/19  0552 05/23/19  0509   TAC 2.4* <1.5*         Microbiology Results (last 24 hours)     ** No results found for the last 24 hours. **          Radiology Results  No results found.         Patient Active Problem List    Diagnosis Date Noted  Deceased-donor kidney transplant recipient 05/21/2019     Priority: 01.     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     Transplant Serology Workup:  Recipient        Lab Results   Component Value Date    HBCAB NEG 12/17/2018    HBSAG NEG 12/17/2018    HBABQ >800 12/17/2018    HCV NEG 12/17/2018    RPR Nonreactive 12/17/2018    CMVAB POS (A) 12/17/2018    VCAM NEG 12/17/2018    EBNA POS (A) 12/17/2018    TOXO NEG 12/17/2018         Donor        [facility] Donor UNOS ID (no units)   Date Value   05/20/2019 [id number]     Match ID: [id number]          Induction Plan:   Thymo + steroid maintenance     Plan for PPX:   Toxo prophylaxis: (D +/R - Toxo MISMATCH), Septra DS 1 tab PO Daily x 1 month (06/19/2019), then qMWF x 11 months more (05/19/2020) -- 1 year total for Toxo Mismatch   PCP prophylaxis: Septra DS as per above for Toxo ppx    CMV prophylaxis: (D +/R +), Valcyte 900 mg PO Daily (adjusted for renal function) x 3 months (08/18/2019)   Fungal prophylaxis: Fluconazole 100 mg PO q7days x 1 month (06/18/2019)  HBV prophylaxis: Not necessary            At risk of infection transmitted from donor 05/21/2019     Priority: 03.     Patient underwent transplant with no known donor derived risk factors: Donor is not CDC high risk, not HBV core ab + and there are no known donor derived infections. No need for treatment or monitoring beyond the usual opportunistic infection prophylaxis per protocol. Donor with no known malignancies.  The donor's CMV status is IgG positive and the recipient's CMV status is IgG positive.  Prophylaxis will be determined by risk of CMV transmission.                Immunosuppressive management encounter following kidney transplant 05/21/2019     Priority: 04.     Induction agent/dose: Thymo lite  Regime: Tac/MMF  Corticosteroid plan: maintenance   cPRA: 10%    Class I:        Lab Results   Component Value Date    Strong A:31 33 12/17/2018    Comments Unacceptables:(A:31 33) 12/17/2018    Tested Date 12/24/2018 12/17/2018         Class II:         Lab Results   Component Value Date    Comments  12/17/2018     No HLA Class II antibodies were detected in this serum sample. Unacceptables:()    Tested Date 12/24/2018 12/17/2018           AV fistula occlusion (CMS code) 05/22/2019    Transplant follow-up 05/21/2019     Category Problem  Type of follow-up needed in clinic? Ordered? Scheduled? Comments   Nursing (e.g., drain/ Foley) NA NA NA None   Lab/ Micro Standard Tac Follow up results   Needs to be ordered None   Imaging studies/ procedures NA NA NA None   Referrals Uroglogy for stent removal NA Already ordered None           Ureteral stent retained of kidney transplant (CMS code) 05/21/2019     Implant Name Type Inv. Item Serial No. Manufacturer Lot No. LRB No. Used Action    STENT URETHRAL 6FR X 16CM DOUBLE J   [id number] - [id number] Stents-Non Des STENT URETHRAL 6FR X 16CM DOUBLE J   [id number]   [id number] Right 1 Implanted           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    Anemia of chronic renal failure 05/20/2019    Chronic kidney disease-mineral and bone disorder 05/20/2019    ESRD (end stage renal disease) on dialysis (CMS code) 05/16/2019    Pre-transplant evaluation for kidney transplant 04/14/2019     ESRD d/t: Focal Glomerular Sclerosis (Focal Segmental - Fsg), Hypertensive Nephrosclerosis    Listing Date: 12/30/2018  Accumulated List Date: 09/10/2008  Blood Type: AB  UNOS Points: 10.6  cPRA: 10.581%  (as of 12/17/2018) 56m (national): 1% (local): 0%  EPTS: 24%  Days since Last Serum: 116 SA Serum Past Due  Days since Last cPRA: 116      At increased risk for cardiovascular disease 04/12/2019     MYOCARDIAL PERFUSION SCAN (11/20/2018)  1. The pharmacological perfusion study is negative for ischemia.  2. There is not previous study available.  3. The global LV systolic function is normal.  4. Further evaluation with medical management is necessary at this time.  **Post-stress gated SPECT images demonstrated normal contractility with a calculated LVEF of 61%    ECHO TTE (03/05/2019)  1. Normal left ventricular size with moderate concentric left ventricular hypertrophy. EF 65%  2. grade 2 pseudo-normal diastolic LV dysfunction is present.  3. Overall left ventricular systolic function is grossly normal.  4. Mildly enlarged left atrium.  5. The aortic valve is structurally and functionally normal.  6. Mild thickened and/or calcified mitral leaflets.  7. There is trace mitral regurgitation.  8. Mild tricuspid insufficiency with normal estimated right heart pressure. RVSP 19.00 mmHg      EKG (12/23/2018)  Sinus rhythm  Consider inferior infarct    HOLTER MONITOR (10/22/2018)  IMPRESSION:       This  24-hour  Holter  monitor  is  suggestive of  sinus  rhythm with  sinus tachycardia.      There were  PVCs and PACs. There were no  sustained  arrhythmias seen.              Health care maintenance 04/12/2019     COLONOSCOPY (04/02/2018)  Melanosis in the colon.  No specimens collected.          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  Been on HD since 2010.  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      Legally blind  12/02/2018       Problem-based Assessment and Plan    * Deceased-donor kidney transplant recipient  Assessment & Plan  S/p DDRT 05/20/19  Has DGF. Minimal urine output since transplant.  AVF thrombosed on 12/4. IR unable to declot and now s/p RIJ TDC on 05/22/2019.   Had 2 hours dialysis on 12/4 for high K and 3 hours on 12/7.   His usual HD duration is 4.5H MWF.   Will plan for further HD Monday for 4 hours.     At risk for opportunistic infections  Assessment & Plan  Bactrim DS daily until 06/19/19 then MWFs until 05/19/20 (Toxo mismatch)  Valcyte renally dosed until 08/18/19  Fluconazole weekly until 06/18/19    At risk of infection transmitted from donor  [place]  Not a PHS increased risk donor and therefore does not require additional monitoring per increased risk donor protocol.       Immunosuppressive management encounter following kidney transplant  Assessment & Plan  Thymo induction completed.   Increase tac to 4/4 and check daily level.  MMF '1000mg'$  BID as thymo completed.   Pred maintenance     AV fistula occlusion (CMS code)  Assessment & Plan  Unsuccessful declot on 12/4 .  TDC inserted on 12/4.     Delayed graft function of kidney  Assessment & Plan   Needed dialysis 05/21/19 after his transplant on 05/20/19 for hyperkalemia.      Chronic kidney disease-mineral and bone disorder  Assessment & Plan  Calcium, total, Serum / Plasma (mg/dL)   Date Value   05/24/2019 7.8 (L)     Phosphorus, Serum / Plasma (mg/dL)   Date Value   05/24/2019 4.7     Albumin, Serum / Plasma (g/dL)   Date Value   05/20/2019 3.9    Prior to transplant on velphoro.   Low calcium stable.   On Velphoro normally at home for phos binder; on hold. Continue sevelamer for now.   Will monitor calcium and phos daily.     Anemia of chronic renal failure  Assessment & Plan  Lab Results   Component Value Date    Hemoglobin 11.8 (L) 05/24/2019      Hgb at target currently.  No need for EPO.   Monitor daily.    Legally blind  Assessment & Plan  Has son helping out for care.    HTN (hypertension)  Assessment & Plan  Prior to transplant on minoxidil '10mg'$  and clonidine 0.1 PRN.   BP now controlled     Continue amlodipine to '10mg'$ .   Add carvedilol 6.'25mg'$  BID later if BP elevated.      Code Status: FULL    Jodelle Gross  MB Laren Boom, [place]  Transplant Nephrology Fellow  05/24/19

## 2019-05-25 ENCOUNTER — Inpatient Hospital Stay: Admit: 2019-05-25 | Discharge: 2019-05-25 | Payer: MEDICARE

## 2019-05-25 DIAGNOSIS — N179 Acute kidney failure, unspecified: Secondary | ICD-10-CM

## 2019-05-25 DIAGNOSIS — Z940 Kidney transplant status: Secondary | ICD-10-CM

## 2019-05-25 LAB — BASIC METABOLIC PANEL (NA, K,
Anion Gap: 17 — ABNORMAL HIGH (ref 4–14)
Calcium, total, Serum / Plasma: 7.8 mg/dL — ABNORMAL LOW (ref 8.4–10.5)
Carbon Dioxide, Total: 21 mmol/L — ABNORMAL LOW (ref 22–29)
Chloride, Serum / Plasma: 96 mmol/L — ABNORMAL LOW (ref 101–110)
Creatinine: 10.9 mg/dL — ABNORMAL HIGH (ref 0.73–1.24)
Glucose, non-fasting: 90 mg/dL (ref 70–199)
Potassium, Serum / Plasma: 4.2 mmol/L (ref 3.5–5.0)
Sodium, Serum / Plasma: 134 mmol/L — ABNORMAL LOW (ref 135–145)
Urea Nitrogen, Serum / Plasma: 86 mg/dL — ABNORMAL HIGH (ref 7–25)
eGFR - high estimate: 6 mL/min — ABNORMAL LOW (ref >59)
eGFR - low estimate: 5 mL/min — ABNORMAL LOW (ref >59)

## 2019-05-25 LAB — COMPLETE BLOOD COUNT WITH DIFF
Abs Basophils: 0.01 10*9/L (ref 0.00–0.10)
Abs Eosinophils: 0.01 10*9/L (ref 0.00–0.40)
Abs Imm Granulocytes: 0.03 10*9/L (ref <0.10)
Abs Lymphocytes: 0.2 10*9/L — ABNORMAL LOW (ref 1.00–3.40)
Abs Monocytes: 0.24 10*9/L (ref 0.20–0.80)
Abs Neutrophils: 2.16 10*9/L (ref 1.80–6.80)
Hematocrit: 34.2 % — ABNORMAL LOW (ref 41.0–53.0)
Hemoglobin: 11.2 g/dL — ABNORMAL LOW (ref 13.6–17.5)
MCH: 27.8 pg (ref 26.0–34.0)
MCHC: 32.7 g/dL (ref 31.0–36.0)
MCV: 85 fL (ref 80–100)
Platelet Count: 114 10*9/L — ABNORMAL LOW (ref 140–450)
RBC Count: 4.03 10*12/L — ABNORMAL LOW (ref 4.40–5.90)
WBC Count: 2.7 10*9/L — ABNORMAL LOW (ref 3.4–10.0)

## 2019-05-25 LAB — HLA AB - CLASS I SINGLE ANTIGE

## 2019-05-25 LAB — PROTHROMBIN TIME
Int'l Normaliz Ratio: 1.2 (ref 0.9–1.2)
PT: 14.6 s (ref 11.7–15.1)

## 2019-05-25 LAB — PROTEIN, TOTAL, RANDOM URINE
Prot Concentration,UR: 280 mg/dL
Protein/Creat Ratio, Urine: 3807 mg/g crea — ABNORMAL HIGH (ref <150)

## 2019-05-25 LAB — ACTIVATED PARTIAL THROMBOPLAST: Activated Partial Thromboplast: 24.5 s (ref 20.9–30.9)

## 2019-05-25 LAB — TACROLIMUS LEVEL: Tacrolimus: 2.6 ug/L — ABNORMAL LOW (ref 5.0–15.0)

## 2019-05-25 LAB — HEPATITIS B SURFACE ANTIGEN: Hep B surf Ag: NEGATIVE

## 2019-05-25 LAB — MAGNESIUM, SERUM / PLASMA: Magnesium, Serum / Plasma: 2 mg/dL (ref 1.6–2.6)

## 2019-05-25 LAB — CREATININE, RANDOM URINE: Creatinine, Random Urine: 73.54 mg/dL

## 2019-05-25 LAB — HLA ANTIBODY - CLASS II SINGLE

## 2019-05-25 LAB — PHOSPHORUS, SERUM / PLASMA: Phosphorus, Serum / Plasma: 5.5 mg/dL — ABNORMAL HIGH (ref 2.3–4.7)

## 2019-05-25 MED ORDER — PREDNISONE 10 MG TABLET
10 mg | ORAL | Status: AC
  Administered 2019-05-26: 02:00:00 30 mg via ORAL

## 2019-05-25 MED ORDER — AMLODIPINE 10 MG TABLET
10 | Freq: Every day | ORAL | Status: DC
Start: 2019-05-25 — End: 2019-05-26
  Administered 2019-05-25: 14:00:00 via ORAL
  Administered 2019-05-26: 16:00:00 10 mg via ORAL

## 2019-05-25 MED ORDER — CLONIDINE HCL 0.1 MG TABLET
0.1 | Freq: Once | ORAL | Status: DC
Start: 2019-05-25 — End: 2019-05-26

## 2019-05-25 MED ORDER — LORAZEPAM 0.5 MG TABLET
0.5 | Freq: Once | ORAL | Status: AC
Start: 2019-05-25 — End: 2019-05-25
  Administered 2019-05-25: 21:00:00 via ORAL

## 2019-05-25 MED ORDER — FUROSEMIDE 10 MG/ML INJECTION SOLUTION
10 mg/mL | INTRAMUSCULAR | Status: AC
  Administered 2019-05-26: 02:00:00 via INTRAVENOUS

## 2019-05-25 MED ORDER — LIDOCAINE (PF) 10 MG/ML (1 %) INJECTION SOLUTION
10 | Freq: Once | INTRAMUSCULAR | Status: DC
Start: 2019-05-25 — End: 2019-05-26

## 2019-05-25 MED ORDER — TACROLIMUS 1 MG CAPSULE, IMMEDIATE-RELEASE
1 mg | ORAL | Status: AC
  Administered 2019-05-25: 21:00:00 2 mg via ORAL

## 2019-05-25 MED ORDER — FUROSEMIDE 10 MG/ML INJECTION SOLUTION
10 mg/mL | INTRAMUSCULAR | Status: AC
  Administered 2019-05-25: 19:00:00 via INTRAVENOUS

## 2019-05-25 MED ORDER — PREDNISONE 10 MG TABLET
10 | Freq: Every day | ORAL | Status: DC
Start: 2019-05-25 — End: 2019-05-26
  Administered 2019-05-26: 22:00:00 via ORAL

## 2019-05-25 MED ORDER — TACROLIMUS 5 MG CAPSULE
5 | Freq: Two times a day (BID) | ORAL | Status: DC
Start: 2019-05-25 — End: 2019-05-26
  Administered 2019-05-26: 02:00:00 via ORAL

## 2019-05-25 MED FILL — LANSOPRAZOLE 30 MG CAPSULE,DELAYED RELEASE: 30 mg | ORAL | Qty: 1

## 2019-05-25 MED FILL — MYCOPHENOLATE MOFETIL 250 MG CAPSULE: 250 mg | ORAL | Qty: 4

## 2019-05-25 MED FILL — SEVELAMER CARBONATE 800 MG TABLET: 800 mg | ORAL | Qty: 1

## 2019-05-25 MED FILL — ACETAMINOPHEN 500 MG TABLET: 500 mg | ORAL | Qty: 2

## 2019-05-25 MED FILL — SENNA LAX 8.6 MG TABLET: 8.6 mg | ORAL | Qty: 2

## 2019-05-25 MED FILL — PREDNISONE 10 MG TABLET: 10 mg | ORAL | Qty: 1

## 2019-05-25 MED FILL — VALGANCICLOVIR 450 MG TABLET: 450 mg | ORAL | Qty: 1

## 2019-05-25 MED FILL — FUROSEMIDE 10 MG/ML INJECTION SOLUTION: 10 mg/mL | INTRAMUSCULAR | Qty: 8

## 2019-05-25 MED FILL — OXYCODONE 5 MG TABLET: 5 mg | ORAL | Qty: 2

## 2019-05-25 MED FILL — PROGRAF 1 MG CAPSULE: 1 mg | ORAL | Qty: 2

## 2019-05-25 MED FILL — PROGRAF 1 MG CAPSULE: 1 mg | ORAL | Qty: 4

## 2019-05-25 MED FILL — SULFAMETHOXAZOLE 800 MG-TRIMETHOPRIM 160 MG TABLET: 800-160 mg | ORAL | Qty: 1

## 2019-05-25 MED FILL — CLONIDINE HCL 0.1 MG TABLET: 0.1 mg | ORAL | Qty: 1

## 2019-05-25 MED FILL — DOCUSATE SODIUM 250 MG CAPSULE: 250 mg | ORAL | Qty: 1

## 2019-05-25 MED FILL — LORAZEPAM 0.5 MG TABLET: 0.5 mg | ORAL | Qty: 1

## 2019-05-25 MED FILL — AMLODIPINE 10 MG TABLET: 10 mg | ORAL | Qty: 1

## 2019-05-25 NOTE — Progress Notes (Signed)
[facility] PROGRESS / IMMUNOSUPRESSION NOTE     Chief Complaint / Events    Postoperative day 5 after kidney transplant. He continues to have evidence of DG F. He did have a biopsy today to rule out recurrent disease.  Vitals  Temp:  [35.6 C (96.1 F)-36.6 C (97.8 F)] 36.6 C (97.8 F)  Heart Rate:  [59-83] 66  *Resp:  [16-18] 16  BP: (131-168)/(83-107) 152/91  SpO2:  [96 %-100 %] 100 %    Input / Output  I/O last 3 completed shifts:  In: 1420 [P.O.:1420]  Out: 605 [Urine:605]  No intake/output data recorded.  12/07 0701 - 12/08 0700  In: 480 [P.O.:480]  Out: 400 [Urine:400]    Wt Readings from Last 1 Encounters:   05/25/19 (!) 122.5 kg (270 lb 1 oz)       Physical Exam     Patient is alert and oriented. His abdomen is soft. Incision remains clean and dry. He has minimal edema.    Scheduled Meds:   0.9% sodium chloride flush  3 mL Intravenous Q8H SCH    acetaminophen  1,000 mg Oral Q8H SCH    amLODIPine  10 mg Oral Daily SCH    cloNIDine HCL  0.1 mg Oral Once    docusate sodium  250 mg Oral BID SCH    fluconazole  100 mg Oral Q7 Days [facility]    lansoprazole  30 mg Oral BID [facility]    lidocaine (PF)  20 mL Subcutaneous Once    methylPREDNISolone  125 mg Intravenous Once    mycophenolate  1,000 mg Oral BID [facility]    [START ON 05/26/2019] predniSONE  60 mg Oral Daily [facility]    senna  17.2 mg Oral Daily At Bedtime [facility]    sevelamer carbonate  800 mg Oral TID WC SCH    sulfamethoxazole-trimethoprim  1 tablet Oral Daily SCH    tacrolimus  6 mg Oral BID KTU/LTU    valGANciclovir  450 mg Oral Twice Weekly SCH     Continuous Infusions:  PRN Meds:   0.9% sodium chloride flush  3 mL Intravenous PRN    cloNIDine HCL  0.1 mg Oral BID PRN    heparin  1,000-6,000 Units Intracatheter PRN    lidocaine  1 patch Topical Daily PRN    opium-belladonna  60 mg Rectal Q8H PRN    oxyCODONE  5-10 mg Oral Q4H PRN    sodium citrate  1-6 mL Intracatheter PRN       Data    Recent Labs     05/25/19  1039 05/25/19  0552 05/24/19  1401  05/24/19  0742 05/24/19  0552 05/23/19  0509   WBC  --  2.7*  --  2.3*  --  2.8*   HGB  --  11.2*  --  11.8*  --  12.2*   HCT  --  34.2*  --  36.0*  --  37.6*   PLT  --  114*  --  112*  --  145   NA  --  134* 134*  --  135 134*   K  --  4.2 4.4  --  5.4* 5.2*   CL  --  96* 98*  --  98* 94*   CO2  --  21* 17*  --  20* 25   BUN  --  86* 67*  --  56* 61*   CREAT  --  10.90* 9.49*  --  8.77* 10.30*   GLU  --  90 124  --  101 113   CA  --  7.8* 7.8*  --  7.7* 7.9*   MG  --  2.0  --   --  2.0 2.1   PO4  --  5.5*  --   --  4.7 6.0*   PT 14.6  --   --   --   --   --    INR 1.2  --   --   --   --   --    PTT 24.5  --   --   --   --   --    TAC  --  2.6*  --   --  2.4* <1.5*           Radiology Results   [place] Needle Biopsy, Kidney    Result Date: 05/25/2019  Successful renal transplant biopsy. No immediate postprocedure complications. Report dictated by: Guadalupe Dawn, MD, signed by: Guadalupe Dawn, MD Department of Radiology and Biomedical Imaging        Assessment & Plan  HTN (hypertension)  Assessment & Plan  BP elevation in PACU immediately post-op. 0.1 clonidine given and BP well controlled now.     - CTM BP      We'll follow up on his biopsy tomorrow. If there is no evidence of rejection or obvious recurrent focal he could potentially be discharged.    The patient continues to remain stable on their immunosuppression, but remains at high risk for immunosuppression complications.    Current liver tests and creatinine:  Recent Labs     05/25/19  0552 05/24/19  1401 05/24/19  0552 05/23/19  0509   CREAT 10.90* 9.49* 8.77* 10.30*       Current immunosuppression drug levels include:   Recent Labs     05/25/19  0552 05/24/19  0552 05/23/19  0509   TAC 2.6* 2.4* <1.5*       Changes made in immunosuppression today include: Prograf dose can be increased.    Otherwise no changes in immunosuppression.    Where indicated, patient continues on prophylaxis for immunosuppression related complications.    Nutrition: Patient well  nourished, no nutritional consult needed.    Pharmacy: Current medications were reviewed and discussed with patientby pharmacy.    Severity of Illness  Emergent major surgery or procedure    Code Status: FULL     Kandice Hams, MD  05/25/2019

## 2019-05-25 NOTE — Progress Notes (Signed)
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    Subjective  Reports feeling fine without issues.  No chest pain or shortness of breath.    Vitals  Temp:  [35.6 C (96.1 F)-36.6 C (97.8 F)] 36.6 C (97.8 F)  Heart Rate:  [59-83] 75  *Resp:  [16-18] 16  BP: (131-168)/(79-107) 145/83  SpO2:  [97 %-100 %] 97 %      Intake/Output Summary (Last 24 hours) at 05/25/2019 1428  Last data filed at 05/25/2019 1323  Gross per 24 hour   Intake 940 ml   Output 405 ml   Net 535 ml       Physical Exam  General:Chronically ill appearing. No acute distress.  HENT:Normocephalic and atraumatic.  PIR:JJOAC  Chest:Good inspiratory effort. Lungs clear to auscultation without use of accessory muscles.  Heart:Regular rate and rhythm. No rubs, murmurs or gallops.  Abdomen:Nontender, nondistended without hepatosplenomegaly or masses.  Musculoskeletal:Normal gait, tone, and mass. No clubbing or cyanosis.  Skin:No icterus, lesions, rashes or skin breakdown.  Psychiatric:Normal mood and affect.  Neurological:Cranial nerves grossly intact. Alert and oriented x 3. No asterixis. No tremor.  Vascular:RUE AVF for access.  No bruit noted this morning.  GU: No bruit from allograph     Data    CBC        05/25/19  0552 05/24/19  0742 05/23/19  0509   WBC 2.7* 2.3* 2.8*   HGB 11.2* 11.8* 12.2*   HCT 34.2* 36.0* 37.6*   PLT 114* 112* 145   MCV 85 85 86       Chem7        05/25/19  0552 05/24/19  1401 05/24/19  0552   NA 134* 134* 135   K 4.2 4.4 5.4*   CL 96* 98* 98*   CO2 21* 17* 20*   BUN 86* 67* 56*   CREAT 10.90* 9.49* 8.77*   GLU 90 124 101       Electrolytes        05/25/19  0552 05/24/19   1401 05/24/19  0552 05/23/19  0509   CA 7.8* 7.8* 7.7* 7.9*   MG 2.0  --  2.0 2.1   PO4 5.5*  --  4.7 6.0*       Blood Gas  No results found in last 72 hours    Liver Panel  No results found in last 72 hours    Coags        05/25/19  1039   PTT 24.5   INR 1.2       Immunosuppression        05/25/19  0552 05/24/19  0552 05/23/19  0509   TAC 2.6* 2.4* <1.5*       Urinalysis  '@LASTLABCURRENC'$ (GUA:1,BIUA:1,KEUA:1,SGUA:1,HBUA:1,PHUA:1,PRUA:1,NIUA:1,WEUA:1,WCU:1,RCU:1,SQEU:1,CAST:1)@      Microbiology Results (last 24 hours)     ** No results found for the last 24 hours. **          Radiology Results  No results found.         Patient Active Problem List    Diagnosis Date  Noted    Deceased-donor kidney transplant recipient 05/21/2019     Priority: 01.     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 KDPI:38%     Inflow:Right, External Iliac Artery  Outflow:Right, External Iliac Vein  Drainage:Ureteroneocystostomy  Stent:Yes    CIT:23 hours 45 min  WIT:41 min        AV fistula occlusion (CMS code) 05/22/2019    Immunosuppressive management encounter following kidney transplant 05/21/2019     Induction agent/dose: Thymo lite  Regime: Tac/MMF  Corticosteroid plan: maintenance   cPRA: 10%    Class I:        Lab Results   Component Value Date    Strong A:31 33 12/17/2018    Comments Unacceptables:(A:31 33) 12/17/2018    Tested Date 12/24/2018 12/17/2018         Class II:         Lab Results   Component Value Date    Comments  12/17/2018     No HLA Class II antibodies were detected in this serum sample. Unacceptables:()    Tested Date 12/24/2018 12/17/2018           Transplant follow-up 05/21/2019     Category Problem Type of follow-up needed in clinic? Ordered? Scheduled? Comments    Nursing (e.g., drain/ Foley) NA NA NA None   Lab/ Micro Standard Tac Follow up results   Needs to be ordered None   Imaging studies/ procedures NA NA NA None   Referrals Uroglogy for stent removal NA Already ordered None           Ureteral stent retained of kidney transplant (CMS code) 05/21/2019     Implant Name Type Inv. Item Serial No. Manufacturer Lot No. LRB No. Used Action   STENT URETHRAL 6FR X 16CM DOUBLE J   [id number] - [id number] Stents-Non Des STENT URETHRAL 6FR X 16CM DOUBLE J   [id number]   [id number] Right 1 Implanted           At risk for opportunistic infections 05/21/2019     Transplant Serology Workup:  Recipient        Lab Results   Component Value Date    HBCAB NEG 12/17/2018    HBSAG NEG 12/17/2018    HBABQ >800 12/17/2018    HCV NEG 12/17/2018    RPR Nonreactive 12/17/2018    CMVAB POS (A) 12/17/2018    VCAM NEG 12/17/2018    EBNA POS (A) 12/17/2018    TOXO NEG 12/17/2018         Donor        Cadaver Donor UNOS ID (no units)   Date Value   05/20/2019 [id number]     Match ID: [id number]          Induction Plan:   Thymo + steroid maintenance     Plan for PPX:   Toxo prophylaxis: (D +/R - Toxo MISMATCH), Septra DS 1 tab PO Daily x 1 month (06/19/2019), then qMWF x 11 months more (05/19/2020) -- 1 year total for Toxo Mismatch   PCP prophylaxis: Septra DS as per above for Toxo ppx   CMV prophylaxis: (D +/R +), Valcyte 900 mg PO Daily (adjusted for renal function) x 3 months (08/18/2019)   Fungal prophylaxis: Fluconazole 100 mg PO q7days x  1 month (06/18/2019)  HBV prophylaxis: Not necessary            At risk of infection transmitted from donor 05/21/2019      Patient underwent transplant with no known donor derived risk factors: Donor is not CDC high risk, not HBV core ab + and there are no known donor derived infections. No need for treatment or monitoring beyond the usual opportunistic infection prophylaxis per protocol. Donor with no known malignancies.  The donor's CMV status is IgG positive and the recipient's CMV status is IgG positive.  Prophylaxis will be determined by risk of CMV transmission.                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     Dialysis Type: HD Hemodialysis  Dialysis Center: [facility]         Phone: [phone]        Fax: [phone]       Address: [phone]  Schedule for Hemodialysis:         On These Days: M/W/F AM       Anemia of chronic renal failure 05/20/2019    Chronic kidney disease-mineral and bone disorder 05/20/2019    ESRD (end stage renal disease) on dialysis (CMS code) 05/16/2019    Pre-transplant evaluation for kidney transplant 04/14/2019     ESRD d/t: Focal Glomerular Sclerosis (Focal Segmental - Fsg), Hypertensive Nephrosclerosis    Listing Date: 12/30/2018  Accumulated List Date: 09/10/2008  Blood Type: AB  UNOS Points: 10.6  cPRA: 10.581%  (as of 12/17/2018) 73m (national): 1% (local): 0%  EPTS: 24%  Days since Last Serum: 116 SA Serum Past Due  Days since Last cPRA: 116      At increased risk for cardiovascular disease 04/12/2019     MYOCARDIAL PERFUSION SCAN (11/20/2018)  1. The pharmacological perfusion study is negative for ischemia.  2. There is not previous study available.  3. The global LV systolic function is normal.  4. Further evaluation with medical management is necessary at this time.  **Post-stress gated SPECT images demonstrated normal contractility with a calculated LVEF of 61%    ECHO TTE (03/05/2019)  1. Normal left ventricular size with moderate concentric left ventricular hypertrophy. EF 65%   2. grade 2 pseudo-normal diastolic LV dysfunction is present.  3. Overall left ventricular systolic function is grossly normal.  4. Mildly enlarged left atrium.  5. The aortic valve is structurally and functionally normal.  6. Mild thickened and/or calcified mitral leaflets.  7. There is trace mitral regurgitation.  8. Mild tricuspid insufficiency with normal estimated right heart pressure. RVSP 19.00 mmHg      EKG (12/23/2018)  Sinus rhythm  Consider inferior infarct    HOLTER MONITOR (10/22/2018)  IMPRESSION:      This  24-hour  Holter  monitor  is  suggestive of  sinus  rhythm with  sinus tachycardia.      There were  PVCs and PACs. There were no  sustained  arrhythmias seen.              Health care maintenance 04/12/2019     COLONOSCOPY (04/02/2018)  Melanosis in the colon.  No specimens collected.          FSGS (focal segmental glomerulosclerosis) 12/02/2018     BIOPSY KIDNEY (10/01/2007)  Focal Segmental Glomerulosclerosis  Patchy interstitial fibrosis and "thyroidzation type of tubular atrophy  HTN (hypertension) 12/02/2018    ESRD (end stage renal disease) (CMS code) 12/02/2018     ESRD due to FSGS (Bx proven in 09/2007) and HTN.  Been on HD since 2010.  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       Problem-based Assessment and Plan    * Deceased-donor kidney transplant recipient  Assessment & Plan  S/p DDRT 05/20/19  Has DGF. Minimal urine output since transplant.  AVF thrombosed on 12/4. IR unable to declot and now s/p RIJ TDC on 05/22/2019.   Had 2 hours dialysis on 12/4 for high K and 3 hours on 12/7.   His usual HD duration is 4.5H MWF.     Plan for cause biopsy today to check for recurrent FSGS.    Plan for HD today if possible with staff.  If not today we will plan for HD tomorrow instead.          AV fistula occlusion (CMS code)  Assessment & Plan  Unsuccessful declot on 12/4 .  TDC inserted on 12/4.     Delayed graft function of kidney   Assessment & Plan  Needed dialysis 05/21/19 after his transplant on 05/20/19 for hyperkalemia.      At risk of infection transmitted from donor  [place]  Not a PHS increased risk donor and therefore does not require additional monitoring per increased risk donor protocol.       At risk for opportunistic infections  Assessment & Plan  Bactrim DS daily until 06/19/19 then MWFs until 05/19/20 (Toxo mismatch)  Valcyte renally dosed until 08/18/19  Fluconazole weekly until 06/18/19    Immunosuppressive management encounter following kidney transplant  Assessment & Plan  Thymo induction completed.   Increase tac to 6/6 and check daily level.  MMF '1000mg'$  BID   Pred maintenance     Chronic kidney disease-mineral and bone disorder  Assessment & Plan  Prior to transplant on velphoro.   Low calcium stable.   On Velphoro normally at home for phos binder; on hold. Continue sevelamer for now.   Will monitor calcium and phos daily.     Anemia of chronic renal failure  Assessment & Plan  Hgb at target currently.  No need for EPO.   Monitor daily.    Legally blind  Assessment & Plan  Has son helping out for care.    HTN (hypertension)  Assessment & Plan  Prior to transplant on minoxidil '10mg'$  and clonidine 0.1 PRN.   BP now controlled     Continue amlodipine to '10mg'$ .   Carvedilol 6.'25mg'$  BID if needed.        Code Status: [place], DO  05/25/2019

## 2019-05-25 NOTE — Progress Notes (Signed)
Spoke to [facility] HD [facility], chair 5:15 AM for Weds 12/9, spoke w Lawson Fiscal, Therapist, sports.

## 2019-05-25 NOTE — Progress Notes (Signed)
TRANSPLANT NEPHROLOGY PROGRESS NOTE    Date of service: 05/25/2019    Name of the patient: Cody Myers  Date of birth: 1975/07/15  MRN: [id number]    Indication for kidney biopsy: Cause biopsy for elevated SCr       Assessment:  I personally reviewed the patient's medications, blood pressure, and laboratory data.   I have reviewed the indication for the biopsy.    Vitals:    05/25/19 0446 05/25/19 0551 05/25/19 0708 05/25/19 0752   BP:  (!) 168/100 142/89 131/85   BP Location:   Left upper arm Left upper arm   Patient Position:   Lying Lying   Pulse:  65 60 59   Resp: 18  16 16    Temp:   36.4 C (97.5 F) 36.3 C (97.4 F)   TempSrc:   Oral Oral   SpO2:   99% 100%   Weight:       Height:             Recent Labs     05/25/19  1039 05/25/19  0552 05/24/19  1401 05/24/19  0742 05/24/19  0552 05/23/19  0509   WBC  --  2.7*  --  2.3*  --  2.8*   HGB  --  11.2*  --  11.8*  --  12.2*   HCT  --  34.2*  --  36.0*  --  37.6*   PLT  --  114*  --  112*  --  145   NA  --  134* 134*  --  135 134*   K  --  4.2 4.4  --  5.4* 5.2*   CL  --  96* 98*  --  98* 94*   CO2  --  21* 17*  --  20* 25   BUN  --  86* 67*  --  56* 61*   CREAT  --  10.90* 9.49*  --  8.77* 10.30*   GLU  --  90 124  --  101 113   PT 14.6  --   --   --   --   --    INR 1.2  --   --   --   --   --    PTT 24.5  --   --   --   --   --        Bleeding Risk Assessment:  The patient is taking anticoagulant ASA with the last dose on few days ago    Recommendation:   Safe to proceed with kidney biopsy          Andi Devon, DO  05/25/2019  11:42 AM

## 2019-05-25 NOTE — Plan of Care (Signed)
Problem: Activity Intolerance - Kidney Transplant Patient - Adult  Goal: Able to perform physical activity as ordered  Outcome: Progress within 12 hours     Problem: Bleeding, at Risk or Actual - Kidney Transplant Patient - Adult  Goal: Absence of impaired coagulation signs and symptoms / active bleeding  Outcome: Progress within 12 hours     Problem: Gas Exchange, Impaired- Kidney Transplant Patient - Adult  Goal: Adequate oxygenation (absence of signs and symptoms of hypoxemia)  Outcome: Progress within 12 hours     Problem: GU Elimination Impaired - Kidney Transplant Patient - Adult  Goal: Able to void spontaneously  Outcome: Progress within 12 hours

## 2019-05-25 NOTE — Consults (Signed)
[facility]  Department of Nutrition Services  Transplant Nutrition Assessment and Education for Discharge    Assessment Data:  Patient history: 43 y.o. male with hx of ESRD 2/2 FSGS (dx on biopsy in 09/2007) and HTN on HD since 2010 and now presents for DDRT on 12/2. 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 Nutrition visiting for diet education    Diet order: Renal Diet 2-3 gm Sodium; 2-3 gm Potassium     Anthropometrics:  Height: 182.9 cm (6')   IBW: 80.9 kg +/-10%  UBW: 113.6 kg verified by pt  Admit weight (12/2): 120 kg (148% IBW, 106% UBW)  BMI: 35.9 kg/m2 (based on 120 kg wt) c/w obese class II    Weight history: Pt reports some wt gain pta 2/2 fluid retention. Wt appears stable per records.  05/16/19 (!) 118.3 kg (260 lb 12.9 oz)   04/21/19 (!) 117.5 kg (259 lb 2.1 oz)   04/14/19 (!) 117.8 kg (259 lb 12.8 oz)       Scheduled medications:    docusate sodium  250 mg Oral BID SCH    fluconazole  100 mg Oral Q7 Days [place]    lansoprazole  30 mg Oral BID SCH    methylPREDNISolone  125 mg Intravenous Once    mycophenolate  1,000 mg Oral BID SCH    predniSONE  30 mg Oral Once    senna  17.2 mg Oral Daily At Bedtime [facility]    sevelamer carbonate  800 mg Oral TID WC [place]    sulfamethoxazole-trimethoprim  1 tablet Oral Daily [place]    tacrolimus  2 mg Oral Once    tacrolimus  6 mg Oral BID KTU/LTU    valGANciclovir  450 mg Oral Twice Weekly [facility]       Labs:   Biochemical:  Lab Results   Component Value Date    NA 134 (L) 05/25/2019    K 4.2 05/25/2019    BUN 86 (H) 05/25/2019    CREAT 10.90 (H) 05/25/2019    GLU 90 05/25/2019    CA 7.8 (L) 05/25/2019    MG 2.0 05/25/2019    PO4 5.5 (H) 05/25/2019   (12/7) eGFR 5-6    Lab Results   Component Value Date    AST 9 05/20/2019    ALT 7 (L) 05/20/2019    ALKP 80 05/20/2019    DBILI 0.2 05/20/2019    TBILI 0.7 05/20/2019    GGT 13 05/20/2019    PT 14.6 05/25/2019      Lab Results   Component Value Date    Hemoglobin 11.2 (L) 05/25/2019    MCV 85 05/25/2019    Neutrophil Absolute Count 2.16 05/25/2019     Vitamin/mineral profile:--    Inflammatory profile:   Lab Results   Component Value Date    ALB 3.9 05/20/2019     Lab Results   Component Value Date    WBC Count 2.7 (L) 05/25/2019     Food and nutrition history: Pt reports he follows a renal diet at home and is familiar w/ renal diet. Vague w/ diet recall, "Whatever I find in the fridge." Appetite was fine before coming into the hospital and appetite is fine currently.    Nutrition Diagnosis: Limited food and nutrition knowledge related to new dietary recommendations post transplant as evidenced by need for education on post transplant nutrition.    Nutrition Intervention:  Nutrition Education  Instructed patient on the role of nutrition in recovery after transplantation over the phone.   -->In the short term (during recovery), recommend a moderate calorie and high protein diet needed for wound healing and stress of surgery. Reviewed high protein sources.  -->Recommend a low sodium diet for blood pressure control and heart health. Recommend avoid adding salt to foods, avoid salt in cooking process, and choose fresh/unprocessed foods.  -->Discussed safe food handling practices due to immunosuppression.   -->Recommend limit concentrated sweets to minimize elevated blood sugar while on post-transplant medications.  -->In the long term, recommend follow a heart healthy diet. Reviewed heart healthy foods to choose and foods with unhealthy fats to limit.    '[x]'$  See nutrition written materials located in transplant binder/booklet: Good Nutrition After Transplantation  Barriers to learning: vision - informed pt to have son review information in transplant booklet  $Remove'[x]'OTarmuL$  Comprehension: Pt receptive to education and verbalized understanding of the material covered, asking appropriate questions. Expect good compliance.     Diet:  -->  Recommend add phos restriction to current diet I/s/o high phos and continued need for HD    Monitoring/Evaluation:   See nutrition care plan for goals/progress.     Shawn Route, MS, RD, CNSC  Voalte: [phone]  Salomon Mast Pager: [phone]    Covering for: Maryln Manuel, RD, [place]  Voalte: [phone]  Service pager: [phone]    Salomon Mast Pager: [phone]

## 2019-05-26 LAB — COMPLETE BLOOD COUNT WITH DIFF
Abs Basophils: 0.01 10*9/L (ref 0.00–0.10)
Abs Eosinophils: 0 10*9/L (ref 0.00–0.40)
Abs Imm Granulocytes: 0.05 10*9/L (ref <0.10)
Abs Lymphocytes: 0.16 10*9/L — ABNORMAL LOW (ref 1.00–3.40)
Abs Monocytes: 0.3 10*9/L (ref 0.20–0.80)
Abs Neutrophils: 3.37 10*9/L (ref 1.80–6.80)
Hematocrit: 32.7 % — ABNORMAL LOW (ref 41.0–53.0)
Hemoglobin: 10.8 g/dL — ABNORMAL LOW (ref 13.6–17.5)
MCH: 27.7 pg (ref 26.0–34.0)
MCHC: 33 g/dL (ref 31.0–36.0)
MCV: 84 fL (ref 80–100)
Platelet Count: 144 10*9/L (ref 140–450)
RBC Count: 3.9 10*12/L — ABNORMAL LOW (ref 4.40–5.90)
WBC Count: 3.9 10*9/L (ref 3.4–10.0)

## 2019-05-26 LAB — BASIC METABOLIC PANEL (NA, K,
Anion Gap: 18 — ABNORMAL HIGH (ref 4–14)
Calcium, total, Serum / Plasma: 8.1 mg/dL — ABNORMAL LOW (ref 8.4–10.5)
Carbon Dioxide, Total: 21 mmol/L — ABNORMAL LOW (ref 22–29)
Chloride, Serum / Plasma: 97 mmol/L — ABNORMAL LOW (ref 101–110)
Creatinine: 12.75 mg/dL — ABNORMAL HIGH (ref 0.73–1.24)
Glucose, non-fasting: 96 mg/dL (ref 70–199)
Potassium, Serum / Plasma: 4.6 mmol/L (ref 3.5–5.0)
Sodium, Serum / Plasma: 136 mmol/L (ref 135–145)
Urea Nitrogen, Serum / Plasma: 106 mg/dL — ABNORMAL HIGH (ref 7–25)
eGFR - high estimate: 5 mL/min — ABNORMAL LOW (ref >59)
eGFR - low estimate: 4 mL/min — ABNORMAL LOW (ref >59)

## 2019-05-26 LAB — COMPLETE BLOOD COUNT
Hematocrit: 32.7 % — ABNORMAL LOW (ref 41.0–53.0)
Hemoglobin: 11 g/dL — ABNORMAL LOW (ref 13.6–17.5)
MCH: 28.4 pg (ref 26.0–34.0)
MCHC: 33.6 g/dL (ref 31.0–36.0)
MCV: 84 fL (ref 80–100)
Platelet Count: 125 10*9/L — ABNORMAL LOW (ref 140–450)
RBC Count: 3.88 10*12/L — ABNORMAL LOW (ref 4.40–5.90)
WBC Count: 3.3 10*9/L — ABNORMAL LOW (ref 3.4–10.0)

## 2019-05-26 LAB — PROTEIN, TOTAL, RANDOM URINE
Prot Concentration,UR: 68 mg/dL
Protein/Creat Ratio, Urine: 1304 mg/g crea — ABNORMAL HIGH (ref <150)

## 2019-05-26 LAB — BK VIRUS, DNA, QUANTITATIVE, P: BK virus, DNA, Quantitative, p: NOT DETECTED {copies}/mL

## 2019-05-26 LAB — PHOSPHORUS, SERUM / PLASMA: Phosphorus, Serum / Plasma: 7.3 mg/dL — ABNORMAL HIGH (ref 2.3–4.7)

## 2019-05-26 LAB — TACROLIMUS LEVEL: Tacrolimus: 3.6 ug/L — ABNORMAL LOW (ref 5.0–15.0)

## 2019-05-26 LAB — CREATININE, RANDOM URINE: Creatinine, Random Urine: 52.15 mg/dL

## 2019-05-26 LAB — MAGNESIUM, SERUM / PLASMA: Magnesium, Serum / Plasma: 2.2 mg/dL (ref 1.6–2.6)

## 2019-05-26 MED ORDER — SODIUM CHLORIDE 0.9 % DIALYSIS CIRCUIT
0.9 | Freq: Once | INTRAVENOUS | Status: AC
Start: 2019-05-26 — End: 2019-05-26
  Administered 2019-05-26: 19:00:00 200 mL via INTRAVENOUS

## 2019-05-26 MED ORDER — SODIUM CHLORIDE 0.9 % IV BOLUS
0.9 | INTRAVENOUS | Status: DC | PRN
Start: 2019-05-26 — End: 2019-05-26

## 2019-05-26 MED ORDER — PREDNISONE 10 MG TABLET
10 | ORAL_TABLET | ORAL | 0 refills | 30.00000 days | Status: AC
Start: 2019-05-26 — End: 2019-06-29

## 2019-05-26 MED ORDER — SODIUM CHLORIDE 0.9 % DIALYSIS CIRCUIT
0.9 | Freq: Once | INTRAVENOUS | Status: AC
Start: 2019-05-26 — End: 2019-05-26
  Administered 2019-05-26: 16:00:00 200 mL via INTRAVENOUS

## 2019-05-26 MED ORDER — LIDOCAINE (PF) 10 MG/ML (1 %) INJECTION SOLUTION
10 | INTRAMUSCULAR | Status: DC | PRN
Start: 2019-05-26 — End: 2019-05-26

## 2019-05-26 MED ORDER — LIDOCAINE 4 % TOPICAL CREAM
4 | Freq: Once | TOPICAL | Status: DC | PRN
Start: 2019-05-26 — End: 2019-05-26

## 2019-05-26 MED ORDER — SODIUM CHLORIDE 0.9 % DIALYSIS CIRCUIT
0.9 | INTRAVENOUS | Status: DC | PRN
Start: 2019-05-26 — End: 2019-05-26

## 2019-05-26 MED ORDER — PREDNISONE 5 MG TABLET
5 | ORAL_TABLET | Freq: Every day | ORAL | 3 refills | 30.00000 days | Status: DC
Start: 2019-05-26 — End: 2020-03-17

## 2019-05-26 MED ORDER — OMEPRAZOLE 20 MG CAPSULE,DELAYED RELEASE
20 | ORAL_CAPSULE | Freq: Every day | ORAL | 2 refills | 30.00000 days | Status: AC
Start: 2019-05-26 — End: 2019-08-19

## 2019-05-26 MED ORDER — AMLODIPINE 10 MG TABLET
10 | ORAL_TABLET | Freq: Every day | ORAL | 5 refills | 90.00000 days | Status: DC
Start: 2019-05-26 — End: 2019-07-14

## 2019-05-26 MED ORDER — FUROSEMIDE 80 MG TABLET
80 | ORAL_TABLET | Freq: Every day | ORAL | 1 refills | Status: DC
Start: 2019-05-26 — End: 2019-06-04

## 2019-05-26 MED ORDER — TACROLIMUS 1 MG CAPSULE, IMMEDIATE-RELEASE
1 mg | ORAL | Status: AC
  Administered 2019-05-26: 22:00:00 via ORAL

## 2019-05-26 MED ORDER — CLONIDINE HCL 0.1 MG TABLET
0.1 | ORAL_TABLET | Freq: Two times a day (BID) | ORAL | 5 refills | Status: DC
Start: 2019-05-26 — End: 2019-06-04

## 2019-05-26 MED ORDER — TACROLIMUS 1 MG CAPSULE
1 mg | ORAL_CAPSULE | ORAL | 11 refills | Status: AC
Start: 2019-05-26 — End: 2019-06-09

## 2019-05-26 MED ORDER — TACROLIMUS 5 MG CAPSULE
5 | Freq: Once | ORAL | Status: AC
Start: 2019-05-26 — End: 2019-05-26
  Administered 2019-05-26: 22:00:00 via ORAL

## 2019-05-26 MED ORDER — OXYCODONE 5 MG TABLET
5 | ORAL_TABLET | Freq: Four times a day (QID) | ORAL | 0 refills | Status: DC | PRN
Start: 2019-05-26 — End: 2019-05-26

## 2019-05-26 MED ORDER — FUROSEMIDE 10 MG/ML INJECTION SOLUTION
10 | Freq: Once | INTRAMUSCULAR | Status: AC
Start: 2019-05-26 — End: 2019-05-26
  Administered 2019-05-26: 22:00:00 via INTRAVENOUS

## 2019-05-26 MED ORDER — FUROSEMIDE 80 MG TABLET
80 | ORAL_TABLET | Freq: Every day | ORAL | 1 refills | Status: DC
Start: 2019-05-26 — End: 2019-05-26

## 2019-05-26 MED ORDER — OXYCODONE 5 MG TABLET
5 | ORAL_TABLET | Freq: Four times a day (QID) | ORAL | 0 refills | Status: AC | PRN
Start: 2019-05-26 — End: ?

## 2019-05-26 MED ORDER — TACROLIMUS 1 MG CAPSULE, IMMEDIATE-RELEASE
1 | ORAL_CAPSULE | ORAL | 11 refills | Status: DC
Start: 2019-05-26 — End: 2019-05-26

## 2019-05-26 MED ORDER — TACROLIMUS 5 MG CAPSULE
5 | Freq: Two times a day (BID) | ORAL | Status: DC
Start: 2019-05-26 — End: 2019-05-26

## 2019-05-26 MED FILL — DOCUSATE SODIUM 250 MG CAPSULE: 250 mg | ORAL | Qty: 1

## 2019-05-26 MED FILL — AMLODIPINE 10 MG TABLET: 10 mg | ORAL | Qty: 1

## 2019-05-26 MED FILL — ACETAMINOPHEN 500 MG TABLET: 500 mg | ORAL | Qty: 2

## 2019-05-26 MED FILL — FUROSEMIDE 10 MG/ML INJECTION SOLUTION: 10 mg/mL | INTRAMUSCULAR | Qty: 8

## 2019-05-26 MED FILL — MYCOPHENOLATE MOFETIL 250 MG CAPSULE: 250 mg | ORAL | Qty: 4

## 2019-05-26 MED FILL — PROGRAF 1 MG CAPSULE: 1 mg | ORAL | Qty: 1

## 2019-05-26 MED FILL — LANSOPRAZOLE 30 MG CAPSULE,DELAYED RELEASE: 30 mg | ORAL | Qty: 1

## 2019-05-26 MED FILL — PREDNISONE 10 MG TABLET: 10 mg | ORAL | Qty: 1

## 2019-05-26 MED FILL — SEVELAMER CARBONATE 800 MG TABLET: 800 mg | ORAL | Qty: 1

## 2019-05-26 MED FILL — SULFAMETHOXAZOLE 800 MG-TRIMETHOPRIM 160 MG TABLET: 800-160 mg | ORAL | Qty: 1

## 2019-05-26 MED FILL — OXYCODONE 5 MG TABLET: 5 mg | ORAL | Qty: 2

## 2019-05-26 MED FILL — HEPARIN (PORCINE) 1,000 UNIT/ML INJECTION SOLUTION: 1000 unit/mL | INTRAMUSCULAR | Qty: 10

## 2019-05-26 MED FILL — SENNA LAX 8.6 MG TABLET: 8.6 mg | ORAL | Qty: 2

## 2019-05-26 NOTE — Assessment & Plan Note (Signed)
No evidence of recurrent FSGS on bx

## 2019-05-26 NOTE — Discharge Summary (Signed)
[facility] - DISCHARGE SUMMARY     Patient Name: Cody Myers  Patient MRN: [id number]  Date of Birth: 05-Dec-1975    Facility:   Attending Physician: Deanna Artis, MD    Date of Admission: 05/20/2019  Date of Discharge: 05/26/2019    Admission Diagnosis: ESRD  Discharge Diagnosis: Deceased-donor kidney transplant recipient    Discharge Disposition: Home with Services    History (with Chief Complaint)    Cody Myers is a 43 y.o. 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 He was admitted for for DDRT on 05/20/2019. His OR course was notable for minimal post-operative urine output. He continued to have delayed graft function during admission. On 12/4 his AVF was found to be clotted. Interventional Radiology was unable to declot the fistula and he required tunneled line placement for ongoing HD. On post-operative day 5 he underwent a renal transplant biopsy to rule out FSGS recurrence contributory to DGF. Results of the biopsy showed resolving ATN without evidence of rejection or recurrent FSGS. He will continue close follow up in the outpatient clinic.     Brief Hospital Course by Problem    AV fistula occlusion (CMS code)  Assessment & Plan  Fistula occluded post-operatively requiring placement of tunneled line; line to be removed when DGF resolves    Delayed graft function of kidney  Overview  Dialysis Type: HD Hemodialysis  Dialysis Center: [facility]         Phone: [phone]        Fax: [phone]       Address: [phone]  Schedule for Hemodialysis:         On These Days: M/W/F AM     Assessment & Plan  Resume outpatient HD on 12/9    At risk of infection transmitted from donor  Overview  Patient underwent transplant with no known donor derived risk factors: Donor is not CDC high risk, not HBV core ab + and there are no known donor derived  infections. No need for treatment or monitoring beyond the usual opportunistic infection prophylaxis per protocol. Donor with no known malignancies.  The donor's CMV status is IgG positive and the recipient's CMV status is IgG positive.  Prophylaxis will be determined by risk of CMV transmission.              Assessment & Plan  Not high risk    At risk for opportunistic infections  Overview  Transplant Serology Workup:  Recipient        Lab Results   Component Value Date    HBCAB NEG 12/17/2018    HBSAG NEG 12/17/2018    HBABQ >800 12/17/2018    HCV NEG 12/17/2018    RPR Nonreactive 12/17/2018    CMVAB POS (A) 12/17/2018    VCAM NEG 12/17/2018    EBNA POS (A) 12/17/2018    TOXO NEG 12/17/2018         Donor        Cadaver Donor UNOS ID (no units)   Date Value   05/20/2019 [id number]     Match ID: [id number]          Induction Plan:   Thymo + steroid maintenance     Plan for PPX:   Toxo prophylaxis: (D +/R - Toxo MISMATCH), Septra DS 1 tab PO Daily  x 1 month (06/19/2019), then qMWF x 11 months more (05/19/2020) -- 1 year total for Toxo Mismatch   PCP prophylaxis: Septra DS as per above for Toxo ppx   CMV prophylaxis: (D +/R +), Valcyte 900 mg PO Daily (adjusted for renal function) x 3 months (08/18/2019)   Fungal prophylaxis: Fluconazole 100 mg PO q7days x 1 month (06/18/2019)  HBV prophylaxis: Not necessary          Assessment & Plan  Continue renally dosed OI    Ureteral stent retained of kidney transplant (CMS code)  Overview  Implant Name Type Inv. Item Serial No. Manufacturer Lot No. LRB No. Used Action   STENT URETHRAL 6FR X 16CM DOUBLE J   [id number] - [id number] Stents-Non Des STENT URETHRAL 6FR X 16CM DOUBLE J   [id number]   [id number] Right 1 Implanted     Referral placed for outpatient stent removal    Transplant follow-up  Overview  Category Problem Type of follow-up needed in clinic? Ordered? Scheduled? Comments   Nursing (e.g., drain/ Foley) HD tunneled line refer to IR for removal when appropriate Needs to  be done in clinic weekly dressing changes in HD clinic until can be removed   Lab/ Micro Standard Tac Follow up results   Needs to be ordered None   Imaging studies/ procedures NA NA NA None   Referrals Uroglogy for stent removal NA Already ordered None   Delayed graft function and improving UOP, monitor for resolution      Immunosuppressive management encounter following kidney transplant  Overview  Induction agent/dose: Thymo lite  Regime: Tac/MMF  Corticosteroid plan: maintenance   cPRA: 10%    Assessment & Plan  Continue steroid taper to maintenance, tac and MMF with twice weekly labs    Legally blind  Assessment & Plan  Son is his caregiver    * Deceased-donor kidney transplant recipient  Overview  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 Continue twice weekly labs and daily measuring and recording of UOP        Physical Exam at Discharge  BP 159/86 (BP Location: Left upper arm, Patient Position: Sitting)   Pulse 68   Temp 36.4 C (97.6 F) (Oral)   Resp 16   Ht 182.9 cm (6')   Wt (!) 122.5 kg (270 lb 1 oz)   SpO2 100%   BMI 36.63 kg/m       Intake/Output Summary (Last 24 hours) at 05/26/2019 1514  Last data filed at 05/26/2019 1433  Gross per 24 hour   Intake 740 ml   Output 1300 ml   Net -560 ml       Physical Exam    Relevant Labs, Radiology, and Other Studies  Recent Labs     05/26/19  0549 05/25/19  1937 05/25/19  1039 05/25/19  0552 05/24/19  1401 05/24/19  0742 05/24/19  0552   WBC 3.9 3.3*  --   2.7*  --  2.3*  --  HGB 10.8* 11.0*  --  11.2*  --  11.8*  --    HCT 32.7* 32.7*  --  34.2*  --  36.0*  --    PLT 144 125*  --  114*  --  112*  --    NA 136  --   --  134* 134*  --  135   K 4.6  --   --  4.2 4.4  --  5.4*   CL 97*  --   --  96* 98*  --  98*   CO2 21*  --   --  21* 17*  --  20*   BUN 106*  --   --  86* 67*  --  56*   CREAT 12.75*  --   --  10.90* 9.49*  --  8.77*   GLU 96  --   --  90 124  --  101   CA 8.1*  --   --  7.8* 7.8*  --  7.7*   MG 2.2  --   --  2.0  --   --  2.0   PO4 7.3*  --   --  5.5*  --   --  4.7   PT  --   --  14.6  --   --   --   --    INR  --   --  1.2  --   --   --   --    PTT  --   --  24.5  --   --   --   --      Tacrolimus   Date Value Ref Range Status   05/26/2019 3.6 (L) 5.0 - 15.0 ug/L Final     Comment:     Performed using the Abbott Architect Chemiluminescent Microparticle Immunoassay (CMIA).     After-Hospital Labs     Basic Metabolic Panel (NA, K, CL, CO2, BUN, CR, GLU, CA)  (Twice a Week)  May 21, 2020      Complete Blood Count with Differential  (2 Two times weekly)  May 21, 2020      Creatinine, random urine  (Once a week)  May 21, 2020      Magnesium, Serum / Plasma  (2 Two times weekly)  May 21, 2020      Phosphorus, Serum / Plasma  (2 Two times weekly)  May 21, 2020      Protein, Total, random urine  (Once a week)  May 21, 2020      Tacrolimus Level  (Twice a Week)  May 21, 2020          Procedures Performed and Complications  Brief Operative Note    Surgeon(s) and Role:     Lubertha Basque. 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  Recipient: cPRA10.5%%; 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  Induction:Thymoglobulin  Serologies: EBV+, CMV+, Toxo+  High risk:No    Backtable:Right kidney;Conventional anatomy - 1 vein/1 artery/1 ureter;Vein extension  Inflow:Right, External Iliac Artery  Outflow:Right, External Iliac Vein  Drainage:Ureteroneocystostomy  Stent:Yes    CIT:23 hours 45 min  WIT:41 min    Estimated Blood Loss: 100  Fluids: 3600 mL crystalloid  UOP 10 mL in 30 minutes post-reperfusion    Wound Class:  2 - Clean/Contaminated (GI, Biliary, Resp, GU tracts entered without spillage)    Incisional Closure Type: Primary closure (all tissues closed tightly or loosely, drains/hardware may exit)    Implants:   Implant Name Type Inv. Item Serial No. Manufacturer Lot No. LRB No. Used Action   STENT URETHRAL 6FR X 16CM DOUBLE J   [id number] - [id number] Stents-Non Des STENT URETHRAL 6FR X 16CM DOUBLE J   [id number]   [id number] Right 1 Implanted        Specimens:       Specimens (From admission, onward)    None           Drains:   Foley    Disposition and Plan:   PACU then floor    Postoperative Instructions:  Stat CBC/BMP in PACU  IVF@50cc /hr  PO pain meds  Lasix gtt @ 10mg /hr  Full thymoglobulin  Steroid Maintenance  Postop check in 4 hours      DISCHARGE INSTRUCTIONS    Discharge Diet  Renal Diet    Functional Assessment at Discharge/Activity Goals  Patient has the following activity limits: no lifting > 10 lbs for 8 weeks .    Allergies and Medications at Discharge    Allergies: Aspirin and Lactose    Your Medications at the End of This Hospitalization       Disp Refills Start End    albuterol 90 mcg/actuation metered dose inhaler        Sig - Route: Inhale 1-2 puffs into the lungs every 6 (six) hours as needed for Wheezing or Shortness of Breath    - Inhalation    Class: Historical Med    oxyCODONE-acetaminophen  (PERCOCET) 10-325 mg tablet        Sig - Route: Take 1 tablet by mouth every 8 (eight) hours as needed for Pain (Lower back pain)    - Oral    Class: Historical Med    sucroferric oxyhydroxide (VELPHORO) 500 mg tablet        Sig - Route: Chew 1,000 mg by mouth 3 (three) times daily with meals    - Oral    Class: Historical Med    acetaminophen (TYLENOL) 500 mg tablet 50 tablet 0 05/21/2019     Sig - Route: Take 2 tablets (1,000 mg total) by mouth every 8 (eight) hours as needed for Pain (mild pain) Max = 3000 mg/day - Oral    Class: OTC    amLODIPine (NORVASC) 10 mg tablet 30 tablet 5 05/26/2019     Sig - Route: Take 1 tablet (10 mg total) by mouth daily - Oral    Notes to Pharmacy: Please discontinue 5 mg Rx, thx!    ciprofloxacin HCl (CIPRO) 500 mg tablet 3 tablet 0 05/21/2019     Sig - Route: Take 1 tablet (500 mg total) by mouth Twice a day Starting night before stent removal appointment - Oral    cloNIDine HCL (CATAPRES) 0.1 mg tablet 60 tablet 5 05/26/2019     Sig -  Route: Take 1 tablet (0.1 mg total) by mouth Twice a day HOLD for SBP <160. - Oral    docusate sodium (COLACE) 250 mg capsule 100 capsule 0 05/21/2019     Sig - Route: Take 1 capsule (250 mg total) by mouth Twice a day Hold for loose stools - Oral    fluconazole (DIFLUCAN) 100 mg tablet 4 tablet 0 05/21/2019     Sig - Route: Take 1 tablet (100 mg total) by mouth every Thursday Stop after 06/18/2019 - Oral    furosemide (LASIX) 80 mg tablet 30 tablet 1 05/26/2019     Sig - Route: Take 1 tablet (80 mg total) by mouth daily Or as directed. - Oral    mycophenolate (CELLCEPT) 250 mg capsule 240 capsule 11 05/21/2019     Sig - Route: Take 4 capsules (1,000 mg total) by mouth Twice a day or as directed - Oral    Notes to Pharmacy: Kidney Transplant on 05/20/2019. ICD-10 Z94.0 Plan for Discharge on 05/23/2019. Please call [phone] for any issues.    omeprazole (PRILOSEC) 20 mg capsule 30 capsule 2 05/21/2019     Sig - Route: Take 1 capsule (20 mg total) by mouth  daily Stop after 08/18/2019 - Oral    oxyCODONE (ROXICODONE) 5 mg tablet 8 tablet 0 05/26/2019     Sig - Route: Take 1 tablet (5 mg total) by mouth every 6 (six) hours as needed (severe pain) - Oral    Earliest Fill Date: 05/26/2019    Notes to Pharmacy: ICD 10: G89.18 (post surgical pain)    predniSONE (DELTASONE) 10 mg tablet 42 tablet 0 05/26/2019     Sig: Take /day 12/9-12/15, /day 12/16-12/22, /day 12/23-12/29.    Class: No Print    Notes to Pharmacy: Kidney Transplant on 05/20/2019. ICD-10 Z94.0 Plan for Discharge on 05/23/2019. Please call [phone] for any issues.    predniSONE (DELTASONE) 5 mg tablet 100 tablet 3 05/26/2019     Sig - Route: Take 1 tablet (5 mg total) by mouth daily Starting 06/17/19 onwards. - Oral    Class: No Print    Notes to Pharmacy: Kidney Transplant on 05/20/2019. ICD-10 Z94.0 Plan for Discharge on 05/23/2019. Please call [phone] for any issues.    senna (SENOKOT) 8.6 mg tablet 100 tablet 0 05/21/2019     Sig - Route: Take 2 tablets (17.2 mg total) by mouth nightly as needed for Constipation - Oral    sulfamethoxazole-trimethoprim (SEPTRA DS) 800-160 mg tablet 30 tablet 4 05/21/2019     Sig: Take 1 tablet by mouth daily through 06/19/2019, then 1 tablet every Monday, Wednesday, and Friday Stop after 05/19/2020    tacrolimus (PROGRAF) 1 mg capsule 720 capsule 11 05/26/2019     Sig: Take by mouth 7 mg in AM and 7 mg in PM or as directed    Class: No Print    Notes to Pharmacy: Kidney Transplant on 05/20/2019. ICD-10 Z94.0 Plan for Discharge on 05/23/2019. Please call [phone] for any issues.    valGANciclovir (VALCYTE) 450 mg tablet 60 tablet 2 05/22/2019     Sig: Take 450 mg by mouth on mondays and thursdays Or as directed. Stop after 08/18/2019    Class: No Print          Discharge With Opioid Medications for:  ACUTE PAIN   According to our records, this patient was not taking opioid medications before this hospitalization.   We do not plan to manage refill and the Primary  Care  Provider should manage refills.   Please evaluate with each refill of an opioid that your patient meets the criteria for ongoing opioid therapy with the goal to avoid chronic opioid use. Please ensure the patient knows about alternatives for pain management and understands how to taper opioids.            Pending Tests  Pending Labs     Order Current Status    BK virus, DNA, Quantitative, plasma In process          Outside Follow-up     Outside Follow Up: Continue outpatient HD    Booked [place] Appointments  Future Appointments   Date Time Provider Department Center   05/28/2019 10:20 AM Billey Chang, MD [facility] All Practice   07/01/2019  2:00 PM Gaynell Face L. Mikki Harbor, MD [id number] All Practice       Pending [place] Referrals  [place] Referrals Made (From admission, onward)     Ordered     Start    05/21/19 0930  Discharge Referral to Urology     Scheduling Instructions: Clinic Phone Number: [phone] Please call the clinic if you do not receive a call within 1 week to schedule an appointment.      05/21/19 0000                Case Management Services Arranged  Case Management Services Arranged: (all recorded)           Discharge Assessment  Condition at discharge:  good  Final Discharge Disposition: Home Health Care (Non [place])(home with home RN svcs for medication management)          Primary Care Physician  None Per Patient Provider  Address: No address on file   Phone: None  Fax: [phone]     Outside Providers, for pending tests please use the following numbers:   For [facility] [facility] - Please Call: [phone]    For [facility] - Please Call: [phone]   For [facility] - Please Call: [phone]    Signed,  Orest Dikes, NP  05/26/2019          Discharge Instructions provided to the patient (if any):    Discharge Instructions     Discharge Instructions     The transplant team and [facility] extend our best wishes to you upon your discharge from the kidney transplant unit. Please  read the following instructions carefully. They contain important information about your follow up, instructions for your recovery, and contact information for the transplant clinic.    We look forward to seeing you in kidney transplant clinic. Please be aware: Clinic visit may be lengthy. Please bring a snack or lunch and a bottle of water for after your lab work is drawn. We would also recommend that you bring something to read or music to listen to.    Your kidney has delayed function. In order for [place] to know when your kidney function is getting better, we need to monitor your urine output when you go home. In order to do this:    1. Please record all of your urine output every day and record it on the same piece of paper with your blood pressure, blood glucoses, etc.    2. Please remember to bring the paper with all of your urine output measurements to your clinic appointment so the NP or MD can review it.      Labs    1.  Please have your labs checked on Monday  December 14,  at [facility] has received electronic orders for this. You may get these labs checked at any [facility] location, so choose a center that is close to your home and convenient for you.    [facility] [address]     [address] [place], [place] [address]      2. Please ALSO have your labs checked ONE HOUR BEFORE you come to your clinic appointment on Thursday December 10. The [facility] has received electronic lab orders for your labs.The lab is located at [facility], 1st floor (in the same building as the clinic).    3. After next week, you will need to your have labs checked once a week at [facility] and once a week at [facility] before your appointments. This will continue for about 4-8 weeks, depending upon how your recovery is progressing. Your clinic nurse practitioner or doctor will let you know when and how to change your lab schedule.     4. On the nights before your lab days, take your Prograf as close to 12 hrs  before the next day's blood draw as you can (For example, if you are getting your labs done at 8:00 a.m, you should take your prograf as close to 8:00 p.m. on the prior night as possible). Then, take your morning dose of Prograf AFTER you complete your lab work (REMEMBER: bring your medication with you to the appointment).                                                General Instructions    Activity Instructions: Do not lift anything >10 lbs and do not bend forward for 6-8 weeks. This helps prevent hernias from developing. Walk at least 4 times a day and sit up at the table for all meals     Restrictions: No baths or submersion in water until your incisions are healed (in about 4-6 weeks when the line starts to look light pink and not darker pink or red). Showers are ok. IF YOU DRIVE: no driving while on pain medications     Diet: Good nutrition is important for recovery. Please see the nutrition section of your transplant handbook for details. If you need more nutrition information after you leave the hospital, ask your doctor.     Urination: Empty your bladder every hour during the day and every 2 hours at night for the first week or until you are seen in clinic.    Drain/Tubes: none     Gastrointestinal / Bowels: Some of your transplant medications can cause frequent bowel movements; call the clinic if you are having more than 3-4 loose bowel movements per day for more than 24 hours. Your transplant medications may also cause you to become nauseated or vomit. Call the clinic if you have vomiting or inability to keep down your transplant or other medications or oral fluids for more than a 24 hour period.     Pain: Take your pain medication as instructed by the pharmacist and your nurse using the pain scale; If pain intensifies or pain is unrelieved with medications, call the clinic.    Other:    1. Wear sunscreen on any exposed areas of your body, at least SPF 30, when going out during daylight  hours.    2. Each  day, write down daily, weight, blood pressure and heart rate, temperature and questions you might have on the flowsheet provided.  Bring this sheet with you to your kidney clinic appointment.  _______________________________________________________________________                                                                 Medications    1. Your immunosuppressive medications are EXTREMELY important for your health and your kidney's function. You MUST take EVERY prescribed anti-rejection medicine EVERY day, ON TIME, and at the [place] in order to protect your kidney. Failing to take your medications, changing your medication doses, or skipping medications (even for short periods of time) may result in [place]. IF YOU EVER HAVE ANY DIFFICULTY TAKING OR OBTAINING YOUR ANTI-REJECTION MEDICINES (This could include issues with your pharmacy, insurance, tolerating the medicines, not knowing how to take your medicines correctly, or any other trouble you have), please CALL your nephrologist or [place] at [phone] or [phone] AND WE WILL BE HAPPY TO HELP YOU ADJUST, OBTAIN, OR UNDERSTAND YOUR MEDICATIONS TO YOU.     2. Only take medications on your green medication card when you leave the hospital. Some of the medication doses on your green card may differ from the doses on your medication bottles at home. ALWAYS follow the medication instructions and doses on your green medication card exactly as the pharmacist has prescribed them, even if they are different from the doses on your medication bottles.     3. Do not stop or change the medications on your green card unless instructed to do so by your kidney nurse practitioner or doctor.     4. Do not start taking any medications that are not on the card unless instructed to do so by your kidney transplant nurse practitioner or doctor.     5. If any of your other doctors need to make a change to your  medications, please have them call the kidney transplant clinic at [phone] and speak with your transplant team first.                                                          When to Call the Clinic     Call the clinic right away and ask to speak to a medical assistant or nurse if you have any of the following:      Temperature greater than 100.4   Persistent nausea and vomiting (for more than 24 hours), especially if you are unable to keep down fluids and medications.   Severe or worsening pain that is not controlled by your pain medications    Signs of infection (pain, swelling, redness, odor or green/yellow/brown/bloody discharge) around the surgical incision   Difficulty breathing, chest pain   Headache or visual disturbances, persistent dizziness or light-headedness   Extreme fatigue or extreme or persistent malaise (generalized feeling of tiredness or being unwell)    If you have any other problems related to your current illness/procedures or if your  symptoms persist or worsen, please contact your doctor.           If you have ANY questions, please call the transplant clinic:    [facility]                     [address] [place] [address]                  Phone: [phone] or [phone]               Patient Instructions    None

## 2019-05-26 NOTE — Assessment & Plan Note (Signed)
CR 12.75 on discharge and still dialysis dependent; UOP increasing and prescribed lasix for non-HD days on dc. Continue twice weekly labs and daily measuring and recording of UOP

## 2019-05-26 NOTE — Assessment & Plan Note (Signed)
Continue steroid taper to maintenance, tac and MMF with twice weekly labs

## 2019-05-26 NOTE — Consults (Signed)
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 PSH - HD fistula placement, Back surgery, eye surgery    Prior Level of Function- Information attained from  : Chart;Patient;Family  Patient is functioning at or near baseline level of function: No  Exceptions are as noted (objective findings): Pt presents to occupational therapy below his functional baseline due to the following impairments - abdominal guidelines limiting autonomy for ADLs and IADLs, lowered activity tolerance. He is blind at baseline and requires social support on a daily basis for ADLs and IADLs (son at bedside is a primary family support). Pt and son verbalizing awareness of all post-op activity guidelines and abdominal precautions, eager to return to PLOF. Pt lives with his family in a home with no identified physical barriers. His son (vs extended family) will be able to provide full-time assist, but limited physically at current time due to broken leg (on crutches). Pt was previously independent with all ADLs and IADLs, enjoys physical exercise and lifting weights.    Lobbyist Needed?: No      ASSESSMENT    Currently in Pain  Currently in Pain: No/denies         Mobility Score: 8: Walking 5-200 ft, any device or assist level    Henry "6 Clicks" Daily Activity Inpatient Outcome Measure  Putting on and taking off regular lower body clothing?: A Little  Bathing (including washing, rinsing, drying)?: A Little   Toileting, which includes using toliet, bedpan, or urinal?: None  Putting on and taking off regular upper body clothing?: None  Taking care of personal grooming such as brushing teeth?: None  Eating meals?: None  6 Click Score: 22      RECOMMENDATIONS    Assessment: Pt presents with no functional barriers to basic ADLs and light IADLs. His abdominal guidelines are not limiting his ability to perform basic room mobility, LB dressing, toileting, grooming (independent to mod I for abdominal guidelines). Pt demo's WFL cognition and safety awareness for modifying his daily activities in context of abdominal precautions. He will have adequate social support (full time, from family) and no obvious environmental home barriers. He benefitted from education re - body mechanics, progression of physical activity for valued activities (exercise vs treadmill, light weights) and in context of low vision. Donning abdominal binder with MIN A, aware of wearing recs and care. Encouraged Pt and son to engage in daily physical walking, maximize Pts personal autonomy and self-care tasks. Pt and son participatory and in agreement with all DC recs.    Plan: DC OT. No further acute OT needs at this time. Pt is functionally appropriate for DC with family/caregiver support when medically cleared.  Nursing to please encourage regular mobility and active participation in self-care tasks, reinforce abdominal guidelines.    Recommended Discharge Disposition: Home    Discharge Recommendations Comments: With increased caregiver/family assist as needed for IADLs, supervision for abdominal guidelines. Son at bedside demo [facility] insight and capacity to provide appropriate levels  of assist in the home.    Assist Level: No assist    Device: None        Joya Gaskins, OT    05/26/2019

## 2019-05-26 NOTE — Consults (Signed)
CLINICAL PHARMACY DISCHARGE NOTE       Service  : Kidney Transplant    Date of Transplant: 05/20/2019 (DDRT)     Patient Name: Cody Myers  MRN: [id number]  CSN: [id number]    Admit Date: 05/20/2019  Discharge Date: 05/26/2019     Allergies/Contraindications   Allergen Reactions    Aspirin Other (See Comments)     Bleeding ulcers; Has stomach bleeding      Lactose        Patient being discharged to: Home    Pharmacy:   [facility] #[id number] [place], [place] - [address] AT [facility] [address] [facility] [id number]  [place] [address]  Phone: [phone] Fax: [phone]    Medications Upon Discharge:    Cephas, Revard   Home Medication Instructions [id number]    Printed on:05/26/19 1821   Medication Information                      acetaminophen (TYLENOL) 500 mg tablet  Take 2 tablets (1,000 mg total) by mouth every 8 (eight) hours as needed for Pain (mild pain) Max = 3000 mg/day             albuterol 90 mcg/actuation metered dose inhaler  Inhale 1-2 puffs into the lungs every 6 (six) hours as needed for Wheezing or Shortness of Breath                amLODIPine (NORVASC) 10 mg tablet  Take 1 tablet (10 mg total) by mouth daily             ciprofloxacin HCl (CIPRO) 500 mg tablet  Take 1 tablet (500 mg total) by mouth Twice a day Starting night before stent removal appointment             cloNIDine HCL (CATAPRES) 0.1 mg tablet  Take 1 tablet (0.1 mg total) by mouth Twice a day HOLD for SBP <160.             docusate sodium (COLACE) 250 mg capsule  Take 1 capsule (250 mg total) by mouth Twice a day Hold for loose stools             fluconazole (DIFLUCAN) 100 mg tablet  Take 1 tablet (100 mg total) by mouth every Thursday Stop after 06/18/2019             furosemide (LASIX) 80 mg tablet  Take 1 tablet (80 mg total) by mouth daily Only on NON-HD days (T/Th/Sat/Sun)             mycophenolate (CELLCEPT) 250 mg capsule  Take 4 capsules (1,000 mg total) by mouth Twice a day  or as directed             omeprazole (PRILOSEC) 20 mg capsule  Take 1 capsule (20 mg total) by mouth daily Stop after 08/18/2019. May use as needed thereafter.             oxyCODONE (ROXICODONE) 5 mg tablet  Take 1 tablet (5 mg total) by mouth every 6 (six) hours as needed (severe pain)             oxyCODONE-acetaminophen (PERCOCET) 10-325 mg tablet  Take 1 tablet by mouth every 8 (eight) hours as needed for Pain (Lower back pain)                predniSONE (DELTASONE) 10 mg tablet  Take  30mg /day 12/9-12/15, 20mg /day 12/16-12/22, 10mg /day 12/23-12/29.             predniSONE (DELTASONE) 5 mg tablet  Take 1 tablet (5 mg total) by mouth daily Starting 06/17/19 onwards.             senna (SENOKOT) 8.6 mg tablet  Take 2 tablets (17.2 mg total) by mouth nightly as needed for Constipation             sucroferric oxyhydroxide (VELPHORO) 500 mg tablet  Chew 1,000 mg by mouth 3 (three) times daily with meals                sulfamethoxazole-trimethoprim (SEPTRA DS) 800-160 mg tablet  Take 1 tablet by mouth daily through 06/19/2019, then 1 tablet every Monday, Wednesday, and Friday Stop after 05/19/2020             tacrolimus (PROGRAF) 1 mg capsule  Take by mouth 7 mg in AM and 7 mg in PM or as directed             valGANciclovir (VALCYTE) 450 mg tablet  Take 450 mg by mouth on mondays and thursdays Or as directed. Stop after 08/18/2019                Medications Stopped During this Hospitalization:  -minoxidil    Special Instructions:   -pt was instructed to use oxycodone (given Rx for #8 tabs) along with OTC Tylenol to help manage surgical AND back pain; once he is out of oxycodone he can go back to using Percocet (oxy/APAP) tabs again as needed -- he was explicitly informed not to use oxycodone AND Percocet together   -Lasix 80 mg daily only on non-HD days (T/Th/Sat/Sun)   -patient missed a day of solumedrol 125 mg while inpatient so he was given 2 extra days of 60 mg to make up for it which is why his prednisone dates are pushed  back a bit   -patient is BLIND --> meds were reviewed in detail with patient's son Selinda Eon [phone]) who will help his father with his medications at home and setting up his pill box     Donor Derived Infectious Issues:  CMV Status: Donor +/Rec +  Other: 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 CII script for oxycodone 5 mg #8 tabs given to patient.    [X]  Medications were delivered meds-to-bed for patient before leaving.     [X]  Patient provided counseling on discharge medications. Drug-drug interactions, drug-food interactions, medication schedule, and side effects are reviewed with the patient. Patient also counseled on the precautions of herbal supplements and OTC medications. Patient is to consult transplant clinic when initiated on new medications from non-transplant services.     [X]  Reviewed maximum dose of acetaminophen with patient.    [  ] No insulin.    [X]  I have compared the pre-admission medication list to the discharge medications and any differences have been reconciled.    [X]  Pt is able to self prepare medications and understand how to adjust as directed by physicians/team members.    [X]  Updated medication card given to patient at time of discharge and pt was instructed to bring medication card to all clinic appointments.    [X]  For all transplant related issues/problems, pt was instructed to call clinic.    Larence Penning, PharmD  Clinical Pharmacist  Pager: [phone]

## 2019-05-26 NOTE — Assessment & Plan Note (Signed)
Son is his caregiver

## 2019-05-26 NOTE — Assessment & Plan Note (Signed)
Not high risk

## 2019-05-26 NOTE — Plan of Care (Signed)
Problem: Electrolyte Imbalance - Apheresis Hemodialysis Patient - Adult  Goal: Dialyzed with appropriate bath based on lab values  Description: Dialyzed with 3K bath for K of 4.5  Note: On 2 K bath for 3 hours hemodialysis. Pre HD K 4.6 mmol/L.

## 2019-05-26 NOTE — Progress Notes (Addendum)
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    Subjective  Feeling well.  No chest pain or shortness of breath.  Happy to hear he is finally making more urine.    Vitals  Temp:  [36.3 C (97.3 F)-36.6 C (97.9 F)] 36.4 C (97.6 F)  Heart Rate:  [56-70] 68  *Resp:  [16] 16  BP: (135-182)/(74-105) 159/86  SpO2:  [96 %-100 %] 100 %      Intake/Output Summary (Last 24 hours) at 05/26/2019 1523  Last data filed at 05/26/2019 1433  Gross per 24 hour   Intake 740 ml   Output 1300 ml   Net -560 ml       Physical Exam  General:Chronically ill appearing. No acute distress.  HENT:Normocephalic and atraumatic.  LKG:MWNUU  Chest:Good inspiratory effort. Lungs clear to auscultation without use of accessory muscles.  Heart:Regular rate and rhythm. No rubs, murmurs or gallops.  Abdomen:Nontender, nondistended without hepatosplenomegaly or masses.  Musculoskeletal:Normal gait, tone, and mass. No clubbing or cyanosis.  Skin:No icterus, lesions, rashes or skin breakdown.  Psychiatric:Normal mood and affect.  Neurological:Cranial nerves grossly intact. Alert and oriented x 3. No asterixis. No tremor.  Vascular:RUE AVF for access. No bruit noted this morning.  GU: No bruit from allograph    Data    CBC        05/26/19  0549 05/25/19  1937 05/25/19  0552   WBC 3.9 3.3* 2.7*   HGB 10.8* 11.0* 11.2*   HCT 32.7* 32.7* 34.2*   PLT 144 125* 114*   MCV 84 84 85       Chem7        05/26/19  0549 05/25/19  0552 05/24/19  1401   NA 136 134* 134*   K 4.6 4.2 4.4   CL 97* 96* 98*   CO2 21* 21* 17*   BUN 106* 86* 67*   CREAT 12.75* 10.90* 9.49*   GLU 96 90 124       Electrolytes         05/26/19  0549 05/25/19  0552 05/24/19  1401 05/24/19  0552   CA 8.1* 7.8* 7.8* 7.7*   MG 2.2 2.0  --  2.0   PO4 7.3* 5.5*  --  4.7       Blood Gas  No results found in last 72 hours    Liver Panel  No results found in last 72 hours    Coags        05/25/19  1039   PTT 24.5   INR 1.2       Immunosuppression        05/26/19  0548 05/25/19  0552 05/24/19  0552   TAC 3.6* 2.6* 2.4*       Urinalysis  _0 (GUA:1,BIUA:1,KEUA:1,SGUA:1,HBUA:1,PHUA:1,PRUA:1,NIUA:1,WEUA:1,WCU:1,RCU:1,SQEU:1,CAST:1)@      Microbiology Results (last 24 hours)     ** No results found for the last 24 hours. **          Radiology Results  No results found.         Patient Active Problem  List    Diagnosis Date Noted    Deceased-donor kidney transplant recipient 05/21/2019     Priority: 01.     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 KDPI:38%     Inflow:Right, External Iliac Artery  Outflow:Right, External Iliac Vein  Drainage:Ureteroneocystostomy  Stent:Yes    CIT:23 hours 45 min  WIT:41 min        AV fistula occlusion (CMS code) 05/22/2019    Immunosuppressive management encounter following kidney transplant 05/21/2019     Induction agent/dose: Thymo lite  Regime: Tac/MMF  Corticosteroid plan: maintenance   cPRA: 10%    Class I:        Lab Results   Component Value Date    Strong A:31 33 12/17/2018    Comments Unacceptables:(A:31 33) 12/17/2018    Tested Date 12/24/2018 12/17/2018         Class II:         Lab Results   Component Value Date    Comments  12/17/2018     No HLA Class II antibodies were detected in this serum sample. Unacceptables:()    Tested Date 12/24/2018 12/17/2018           Transplant follow-up 05/21/2019      Category Problem Type of follow-up needed in clinic? Ordered? Scheduled? Comments   Nursing (e.g., drain/ Foley) NA NA NA None   Lab/ Micro Standard Tac Follow up results   Needs to be ordered None   Imaging studies/ procedures NA NA NA None   Referrals Uroglogy for stent removal NA Already ordered None           Ureteral stent retained of kidney transplant (CMS code) 05/21/2019     Implant Name Type Inv. Item Serial No. Manufacturer Lot No. LRB No. Used Action   STENT URETHRAL 6FR X 16CM DOUBLE J   [id number] - [id number] Stents-Non Des STENT URETHRAL 6FR X 16CM DOUBLE J   [id number]   [id number] Right 1 Implanted           At risk for opportunistic infections 05/21/2019     Transplant Serology Workup:  Recipient        Lab Results   Component Value Date    HBCAB NEG 12/17/2018    HBSAG NEG 12/17/2018    HBABQ >800 12/17/2018    HCV NEG 12/17/2018    RPR Nonreactive 12/17/2018    CMVAB POS (A) 12/17/2018    VCAM NEG 12/17/2018    EBNA POS (A) 12/17/2018    TOXO NEG 12/17/2018         Donor        Cadaver Donor UNOS ID (no units)   Date Value   05/20/2019 [id number]     Match ID: [id number]          Induction Plan:   Thymo + steroid maintenance     Plan for PPX:   Toxo prophylaxis: (D +/R - Toxo MISMATCH), Septra DS 1 tab PO Daily x 1 month (06/19/2019), then qMWF x 11 months more (05/19/2020) -- 1 year total for Toxo Mismatch   PCP prophylaxis: Septra DS as per above for Toxo ppx   CMV prophylaxis: (D +/R +), Valcyte 900 mg PO Daily (adjusted for renal function) x 3 months (08/18/2019)   Fungal prophylaxis:  Fluconazole 100 mg PO q7days x 1 month (06/18/2019)  HBV prophylaxis: Not necessary            At risk of infection transmitted from donor 05/21/2019      Patient underwent transplant with no known donor derived risk factors: Donor is not CDC high risk, not HBV core ab + and there are no known donor derived infections. No need for treatment or monitoring beyond the usual opportunistic infection prophylaxis per protocol. Donor with no known malignancies.  The donor's CMV status is IgG positive and the recipient's CMV status is IgG positive.  Prophylaxis will be determined by risk of CMV transmission.                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  Schedule for Hemodialysis:         On These Days: M/W/F AM       Anemia of chronic renal failure 05/20/2019    Chronic kidney disease-mineral and bone disorder 05/20/2019    ESRD (end stage renal disease) on dialysis (CMS code) 05/16/2019    Pre-transplant evaluation for kidney transplant 04/14/2019     ESRD d/t: Focal Glomerular Sclerosis (Focal Segmental - Fsg), Hypertensive Nephrosclerosis    Listing Date: 12/30/2018  Accumulated List Date: 09/10/2008  Blood Type: AB  UNOS Points: 10.6  cPRA: 10.581%  (as of 12/17/2018) 32m (national): 1% (local): 0%  EPTS: 24%  Days since Last Serum: 116 SA Serum Past Due  Days since Last cPRA: 116      At increased risk for cardiovascular disease 04/12/2019     MYOCARDIAL PERFUSION SCAN (11/20/2018)  1. The pharmacological perfusion study is negative for ischemia.  2. There is not previous study available.  3. The global LV systolic function is normal.  4. Further evaluation with medical management is necessary at this time.  **Post-stress gated SPECT images demonstrated normal contractility with a calculated LVEF of 61%    ECHO TTE (03/05/2019)  1. Normal left ventricular size with moderate concentric left ventricular hypertrophy. EF 65%   2. grade 2 pseudo-normal diastolic LV dysfunction is present.  3. Overall left ventricular systolic function is grossly normal.  4. Mildly enlarged left atrium.  5. The aortic valve is structurally and functionally normal.  6. Mild thickened and/or calcified mitral leaflets.  7. There is trace mitral regurgitation.  8. Mild tricuspid insufficiency with normal estimated right heart pressure. RVSP 19.00 mmHg      EKG (12/23/2018)  Sinus rhythm  Consider inferior infarct    HOLTER MONITOR (10/22/2018)  IMPRESSION:      This  24-hour  Holter  monitor  is  suggestive of  sinus  rhythm with  sinus tachycardia.      There were  PVCs and PACs. There were no  sustained  arrhythmias seen.              Health care maintenance 04/12/2019     COLONOSCOPY (04/02/2018)  Melanosis in the colon.  No specimens collected.          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  Been on HD since 2010.  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       Problem-based Assessment and Plan    * Deceased-donor kidney transplant recipient  Assessment & Plan  S/p DDRT 05/20/19  Has DGF. Urine output better today (~878m last 24h) but cr has not decreased yet.    AVF thrombosed on 12/4. IR unable to declot and now s/p RIJ TDC on 05/22/2019.   Had 2 hours dialysis on 12/4 for high K and 3 hours on 12/7.   His usual HD duration is 4.5H MWF.     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  Prelim results should be out today.     Plan for HD today and resume HD on discharge for now.  Tried to contacted patient's nephrologist (Dr. SKasandra Knudsen [phone] but was not able to get hold of anyone or leave a voice message.   Called dialysis center ([facility]) and left my contact information for Dr. SKasandra Knudsenfor any questions.         AV fistula occlusion (CMS code)  Assessment & Plan  Unsuccessful declot on 12/4 .  TDC inserted on 12/4.     Delayed graft function of kidney  Assessment & Plan  Needed dialysis 05/21/19 after his transplant on 05/20/19 for hyperkalemia.      At risk of infection transmitted from donor  ACameron Not a PHS increased risk donor and therefore does not require additional monitoring per increased risk donor protocol.       At risk for opportunistic infections  Assessment & Plan  Bactrim DS daily until 06/19/19 then MWFs until 05/19/20 (Toxo mismatch)  Valcyte renally dosed until 08/18/19  Fluconazole weekly until 06/18/19    Immunosuppressive management encounter following kidney transplant  Assessment & Plan  Thymo induction completed.   Increase tac to 6/6 and check daily level.  MMF 10027mBID   Pred maintenance     Chronic kidney disease-mineral and bone disorder  Assessment & Plan  Prior to transplant on velphoro.   Low calcium stable.   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  Continue sevelamer for now.     Anemia of chronic renal failure  Assessment & Plan  Hgb at target currently.  No need for EPO.   Monitor daily.    Legally blind  Assessment & Plan  Has son helping out for care.    HTN (hypertension)  Assessment & Plan  Prior to transplant on minoxidil 1071mnd clonidine 0.1 PRN.   BP now controlled     Continue amlodipine to 51m85m Carvedilol 6.25mg77m if needed.      Code Status: FULL [place] 05/26/2019

## 2019-05-26 NOTE — Progress Notes (Signed)
Allograft Kidney Biopsy Preliminary Review for Bx done 05/25/19  Cause biopsy for elevated SCr       Findings:   i0 t0 g0 ptc0 c4d pending    Impression:   ATN  No sign of FSGS    Plan:  No changes    - F/u DSA and BK  - Prelim results and plan discussed with patient in person.    [place] Nephrology Fellow

## 2019-05-26 NOTE — Nursing Note (Signed)
Pre Treatment Nursing Note  Pre treatment report received from Shanon Brow, RN  Pre treatment vitals: Wt NA.  BP 157/92, Pulse 58.  3 hours HD started in AHU via Tunneled Catheter QB 350 ml/min, ordered UF 0.5-1 L as tolerated. NS prime.    Medication Administration:  Aranesp: 0 mcg IVP  Zemplar: 0 mcg IVP  Blood Transfusion: No   After HD MAR reviewed and After HD MAR: no medications due after HD    Post Treatment Nursing Note  Post tx vitals: Wt NA. BP 147/82, Pulse 56.   Pt tolerated 3 hr treatment with net UF 0 L fluid removed. Catheter site clean, dry and intact, dressing & Tegos last changed 05/22/19. Treatment orders were adjusted during the run. UF goal reduced to 0 and K bath changed to 2K.   There were no adverse reactions/complications during this procedure. Post treatment report given to: Shanon Brow, RN

## 2019-05-26 NOTE — Assessment & Plan Note (Signed)
Fistula occluded post-operatively requiring placement of tunneled line; line to be removed when DGF resolves

## 2019-05-26 NOTE — Assessment & Plan Note (Signed)
Resume outpatient HD on 12/9

## 2019-05-26 NOTE — Assessment & Plan Note (Signed)
Continue renally dosed OI

## 2019-05-26 NOTE — Interdisciplinary (Addendum)
CASE MANAGEMENT DISCHARGE        ADULT CASE MANAGEMENT DISCHARGE (most recent)      Discharge Note Flowsheet - 05/20/19        Final Discharge Note    *Primary Case Manager (First and Last Name)  Earnest Rosier, RN Case Management     Final Discharge [place] (Non [place])    home with home RN svcs for medication management    Important Message Follow-up  No     Parent/Family/Legal Guardian agrees with plan  Yes     CD images provided for treatment purposes?  No     Patient Choice  --    agreeable to home with home health RN svcs       Transportation Arrangements    Additional Instructions  patient is being transported by son Diazeh who is at bedside.           Information obtained from son Diazeh at bedside and patient on room ph [phone]. They are both agreeable to home health svcs with [facility] ph [phone]; fax [phone]. Patient will be discharged today 12/8. Patient provided outpatient PCP and Nephrologist information.  PCP: Dr. Vassie Moselle ph [phone]  Nephrologist: Dr. Lindalou Hose ph [phone].     Just confirmed referral received and SOC with [place] from [facility].       Maggie Schwalbe, RN  [employer] Case Management Preceptee  05/26/2019  1:13 PM    Case Manager Student Attestation:     Date of Service: 05/26/2019    I have reviewed and agree with [employer] RN Student, Lovena Le Mckinnie's assessment, intervention, discharge plan, and note. Assigned primary CM will continue to oversee and follow patient for discharge planning needs    Earnest Rosier, RN CM  [facility] Case Manager  Phone [phone]  05/26/2019  2:57 PM

## 2019-05-28 ENCOUNTER — Ambulatory Visit: Admit: 2019-05-28 | Payer: MEDICARE | Primary: Physician

## 2019-05-28 ENCOUNTER — Ambulatory Visit: Admit: 2019-05-28 | Discharge: 2019-05-28 | Payer: MEDICARE | Attending: Physician | Primary: Physician

## 2019-05-28 DIAGNOSIS — Z5181 Encounter for therapeutic drug level monitoring: Secondary | ICD-10-CM

## 2019-05-28 DIAGNOSIS — Z79899 Other long term (current) drug therapy: Secondary | ICD-10-CM

## 2019-05-28 DIAGNOSIS — Z940 Kidney transplant status: Secondary | ICD-10-CM

## 2019-05-28 DIAGNOSIS — Z992 Dependence on renal dialysis: Secondary | ICD-10-CM

## 2019-05-28 DIAGNOSIS — N186 End stage renal disease: Secondary | ICD-10-CM

## 2019-05-28 DIAGNOSIS — N051 Unspecified nephritic syndrome with focal and segmental glomerular lesions: Secondary | ICD-10-CM

## 2019-05-28 DIAGNOSIS — T8619 Other complication of kidney transplant: Secondary | ICD-10-CM

## 2019-05-28 DIAGNOSIS — I10 Essential (primary) hypertension: Secondary | ICD-10-CM

## 2019-05-28 DIAGNOSIS — Z9189 Other specified personal risk factors, not elsewhere classified: Secondary | ICD-10-CM

## 2019-05-28 LAB — COMPLETE BLOOD COUNT WITH DIFF
Abs Basophils: 0.02 10*9/L (ref 0.00–0.10)
Abs Eosinophils: 0.06 10*9/L (ref 0.00–0.40)
Abs Imm Granulocytes: 0.19 10*9/L — ABNORMAL HIGH (ref <0.10)
Abs Lymphocytes: 0.41 10*9/L — ABNORMAL LOW (ref 1.00–3.40)
Abs Monocytes: 0.61 10*9/L (ref 0.20–0.80)
Abs Neutrophils: 3.26 10*9/L (ref 1.80–6.80)
Hematocrit: 34.3 % — ABNORMAL LOW (ref 41.0–53.0)
Hemoglobin: 11.2 g/dL — ABNORMAL LOW (ref 13.6–17.5)
MCH: 27.9 pg (ref 26.0–34.0)
MCHC: 32.7 g/dL (ref 31.0–36.0)
MCV: 85 fL (ref 80–100)
Platelet Count: 155 10*9/L (ref 140–450)
RBC Count: 4.02 10*12/L — ABNORMAL LOW (ref 4.40–5.90)
WBC Count: 4.6 10*9/L (ref 3.4–10.0)

## 2019-05-28 LAB — BASIC METABOLIC PANEL (NA, K,
Anion Gap: 17 — ABNORMAL HIGH (ref 4–14)
Calcium, total, Serum / Plasma: 8.4 mg/dL (ref 8.4–10.5)
Carbon Dioxide, Total: 28 mmol/L (ref 22–29)
Chloride, Serum / Plasma: 96 mmol/L — ABNORMAL LOW (ref 101–110)
Creatinine: 8.75 mg/dL — ABNORMAL HIGH (ref 0.73–1.24)
Glucose, non-fasting: 65 mg/dL — ABNORMAL LOW (ref 70–199)
Potassium, Serum / Plasma: 3.7 mmol/L (ref 3.5–5.0)
Sodium, Serum / Plasma: 141 mmol/L (ref 135–145)
Urea Nitrogen, Serum / Plasma: 72 mg/dL — ABNORMAL HIGH (ref 7–25)
eGFR - high estimate: 8 mL/min — ABNORMAL LOW (ref >59)
eGFR - low estimate: 7 mL/min — ABNORMAL LOW (ref >59)

## 2019-05-28 LAB — MAGNESIUM, SERUM / PLASMA: Magnesium, Serum / Plasma: 2 mg/dL (ref 1.6–2.6)

## 2019-05-28 LAB — PHOSPHORUS, SERUM / PLASMA: Phosphorus, Serum / Plasma: 5.9 mg/dL — ABNORMAL HIGH (ref 2.3–4.7)

## 2019-05-28 LAB — CREATININE, RANDOM URINE: Creatinine, Random Urine: 74.52 mg/dL

## 2019-05-28 LAB — PROTEIN, TOTAL, RANDOM URINE
Prot Concentration,UR: 178 mg/dL
Protein/Creat Ratio, Urine: 2389 mg/g crea — ABNORMAL HIGH (ref <150)

## 2019-05-28 LAB — TACROLIMUS LEVEL: Tacrolimus: 5 ug/L (ref 5.0–15.0)

## 2019-05-28 NOTE — Telephone Encounter (Signed)
OKRA Study - Evaluation of Patient Outcomes of KidneyCare on Renal Allografts (OKRA)     Approached pt during Kidney Post-Transplantclinic appointment for study recruitment. Pt is legally blind and was accompanied by son Selinda Eon. After pt expressed interest in the study, we discussed the informed consent. Pt established understanding of participant procedures, rights, risks, benefits and all questions were answered. Consent and HIPAA authorization were signed by son Selinda Eon as witness for [employer]. Copies of signed Informed Consent document, signed HIPAA authorizationand Participant [place] [employer] of Rights were given to pt and son for personal records.

## 2019-05-28 NOTE — Assessment & Plan Note (Addendum)
Last tac pre D/C was 3.6 as of 12.8.2020 and dose currently 7 bid and waiting results from today. Goal 6-8 (DGF, recent bx showing no rejection, and cPRA 10%). MMF 1 gm bid, pred tapering.     Condition: chronic   Status: stable/persistent    Addendum: tac trough 5.0 as of 05/28/2019, would hold the dose steady for now given persistent DGF, and slowly recovering allograft function.

## 2019-05-28 NOTE — Assessment & Plan Note (Signed)
UPc fluctuating with recent UPC 2.389 g/g as of 05/28/2019, but it has steadily been declining since UOP increased, and recent bx 12/7 without FSGS.     Condition: chronic   Status: stable/persistent

## 2019-05-28 NOTE — Assessment & Plan Note (Signed)
Remains in DGF last Cr 8.75 on 12/10 (day after his MWF dialysis at [facility]). Making a little more UOP 3-400 cc/day (anric pre transplant).   UPC fluctuating last 2.389 g/g as of 12/10 and it had been coming down steadily as he started making more UOP. Will continue to monitor for now.  Cause biopsy on 05/25/2019 showed no recurrent FSGS.     Condition: chronic   Status: unstable/persistent

## 2019-05-28 NOTE — Progress Notes (Signed)
Kidney Transplant Unit Clinic Visit Note :     Name: Cody Myers  MRN: [id number]  Date of Service: 05/28/2019    Chief Complaint   Patient presents with    Kidney Transplant Follow-up     " I go get dialysis MWF "     Medication     " I took my medications at 9 pm last night "        Interpreter Services? Not required.      HISTORY OF PRESENT ILLNESS:  Cody Myers is a 43 y.o. year old male. The patient is here today for his first post kidney transplant follow up visit with monitoring and adjustment of the immunosuppressive medications.  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 A reanl biopsy was done 12/7 on POD% to rule out FSGS it showed ATN. His cPRA 0%. Making more urine (anuric preop).    During the clinic visit I have reviewed the patient's past medical, surgical, social and family history.     Location Affected: Kidney  Duration : Persistent  Severity of condition : s/p Kidney Transplant  Exacerbating Factors: on immunosuppression    During the clinic visit I reviewed the current medication list.    Patient's allergies, medications, past medical, surgical, family and social histories were reviewed and updated as appropriate.    Patient is disabled    Patient's current level of function is: 70% = Cares for self, unable to carry on normal activity or active work,      Past Medical History:   Diagnosis Date    Asthma     Inhalers    Back pain     Blood transfusion without reported diagnosis     BMI 35.0-35.9,adult     Height 6 foot even - weight 260 pounds for BMI 36.      Chronic kidney disease 2009    ESRD due FSGS / DM.    Need to clarify FSGS by biopsy.  Noted in evaluation with [facility] but no further documentation of FSGS by Nephrology.     Depression     not taking medication    GERD (gastroesophageal reflux disease)     Gout     Heart murmur     Hypercholesterolemia      Hypertension     2018 BP dropping on dialysis - MD ordered Midodrine 5 mg for SBD <100    Legally blind 2010    Conjunctiva disorder with prolapse right eye.  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    MRSA (methicillin resistant Staphylococcus aureus)     Sleep apnea     Thromboembolism (CMS code)     Dialysis graft only       Past Surgical History:   Procedure Laterality Date    AV FISTULA PLACEMENT Right 2016    BACK SURGERY      Bilateral Eye Surgery      CENTRAL VENOUS CATHETER  2015    Place and removed once fistula working    COLONOSCOPY  03/2018    Negative for polyps - no specimens taken    DIALYSIS FISTULA CREATION Left 2010    Revision 2014    IR DIALYSIS FISTULAGRAM (ORDERABLE BY IR SERVICE ONLY)  05/22/2019    IR DIALYSIS FISTULAGRAM (ORDERABLE BY IR SERVICE ONLY) 05/22/2019 Sunnie Nielsen, MD RAD IR  [facility]    TOOTH EXTRACTION  2018    Wound Vac  2015    Dehiscence anterior torso - chest       Social History     Socioeconomic History    Marital status: Single     Spouse name: Not on file    Number of children: 4    Years of education: 2312    Highest education level: Associate degree: occupational, Scientist, product/process developmenttechnical, or vocational program   Occupational History    Occupation: Disabled Naval architectTruck Driver   Tobacco Use    Smoking status: Former Smoker     Packs/day: 1.00     Years: 4.00     Pack years: 4.00     Types: Cigarettes     Quit date: 06/19/2007     Years since quitting: 11.9    Smokeless tobacco: Never Used   Substance and Sexual Activity    Alcohol use: Not Currently    Drug use: Not Currently    Sexual activity: Not Currently       Family History   Problem Relation Name Age of Onset    Diabetes Mother      Heart disease Mother      Hypertension Mother      Diabetes Father      Heart disease Father      Hypertension Father       After a thorough review of the rest of the family hx, it was not found to be contributory to the presenting complaint.       Current Outpatient Medications    Medication Sig Dispense Refill    acetaminophen (TYLENOL) 500 mg tablet Take 2 tablets (1,000 mg total) by mouth every 8 (eight) hours as needed for Pain (mild pain) Max = 3000 mg/day 50 tablet 0    albuterol 90 mcg/actuation metered dose inhaler Inhale 1-2 puffs into the lungs every 6 (six) hours as needed for Wheezing or Shortness of Breath         amLODIPine (NORVASC) 10 mg tablet Take 1 tablet (10 mg total) by mouth daily 30 tablet 5    ciprofloxacin HCl (CIPRO) 500 mg tablet Take 1 tablet (500 mg total) by mouth Twice a day Starting night before stent removal appointment 3 tablet 0    cloNIDine HCL (CATAPRES) 0.1 mg tablet Take 1 tablet (0.1 mg total) by mouth Twice a day HOLD for SBP <160. 60 tablet 5    docusate sodium (COLACE) 250 mg capsule Take 1 capsule (250 mg total) by mouth Twice a day Hold for loose stools 100 capsule 0    fluconazole (DIFLUCAN) 100 mg tablet Take 1 tablet (100 mg total) by mouth every Thursday Stop after 06/18/2019 4 tablet 0    furosemide (LASIX) 80 mg tablet Take 1 tablet (80 mg total) by mouth daily Only on NON-HD days (T/Th/Sat/Sun) 30 tablet 1    mycophenolate (CELLCEPT) 250 mg capsule Take 4 capsules (1,000 mg total) by mouth Twice a day or as directed 240 capsule 11    omeprazole (PRILOSEC) 20 mg capsule Take 1 capsule (20 mg total) by mouth daily Stop after 08/18/2019. May use as needed thereafter. 30 capsule 2    oxyCODONE (ROXICODONE) 5 mg tablet Take 1 tablet (5 mg total) by mouth every 6 (six) hours as needed (severe pain) 8 tablet 0    oxyCODONE-acetaminophen (PERCOCET) 10-325 mg tablet Take 1 tablet by mouth every 8 (eight) hours as needed for Pain (Lower back pain)  predniSONE (DELTASONE) 10 mg tablet Take 30mg /day 12/9-12/15, 20mg /day 12/16-12/22, 10mg /day 12/23-12/29. 42 tablet 0    predniSONE (DELTASONE) 5 mg tablet Take 1 tablet (5 mg total) by mouth daily Starting 06/17/19 onwards. 100 tablet 3     senna (SENOKOT) 8.6 mg tablet Take 2 tablets (17.2 mg total) by mouth nightly as needed for Constipation 100 tablet 0    sucroferric oxyhydroxide (VELPHORO) 500 mg tablet Chew 1,000 mg by mouth 3 (three) times daily with meals         sulfamethoxazole-trimethoprim (SEPTRA DS) 800-160 mg tablet Take 1 tablet by mouth daily through 06/19/2019, then 1 tablet every Monday, Wednesday, and Friday Stop after 05/19/2020 30 tablet 4    tacrolimus (PROGRAF) 1 mg capsule Take by mouth 7 mg in AM and 7 mg in PM or as directed 720 capsule 11    valGANciclovir (VALCYTE) 450 mg tablet Take 450 mg by mouth on mondays and thursdays Or as directed. Stop after 08/18/2019 60 tablet 2     No current facility-administered medications for this visit.        Review of Systems - General ROS: negative for - chills, fatigue or fever  Respiratory ROS: no cough, shortness of breath, or wheezing  Cardiovascular ROS: no chest pain or dyspnea on exertion  Gastrointestinal ROS: no abdominal pain, change in bowel habits, or black or bloody stools  Neurological ROS: no TIA or stroke symptoms  The rest of the ROS was reviewed and not contributory to presenting complaint.     Physical Examination:  Vitals:    05/28/19 1019   BP: 130/77   Pulse: 70   Resp: 20   Temp: 36.1 C (96.9 F)   TempSrc: Temporal   SpO2: 100%   Weight: 76.5 kg (168 lb 11.2 oz)   Height: 180.3 cm (5\' 11" )      General : not in acute distress  CVS: regular rate and rhythm no pericardial rub, no S3/4  Resp: Clear to auscultation bilaterally  GIT: soft nontender, no organomegaly, BS present  GUT: Renal allograft non tender, there are staples over RLQ incision, some serosanguinous discharge but non prurulent, no renal bruit  MSK: No ankle edema, peripheral cyanosis or clubbing  Skin: no rash or petechiae  Neuro: Alert and orientated *3, non focal exam overall      Labs:  I have reviewed labs from [place] as part of the encounter today.    Creatinine   Date Value Ref Range Status    05/28/2019 8.75 (H) 0.73 - 1.24 mg/dL Final   10/18/2019 14/10/20 (H) 0.73 - 1.24 mg/dL Final   14/03/2019 (H) 0.73 - 1.24 mg/dL Final     Urea Nitrogen, Serum / Plasma   Date Value Ref Range Status   05/28/2019 72 (H) 7 - 25 mg/dL Final   74.12 87/86/7672 (H) 7 - 25 mg/dL Final   09.47 86 (H) 7 - 25 mg/dL Final     Potassium, Serum / Plasma   Date Value Ref Range Status   05/28/2019 3.7 3.5 - 5.0 mmol/L Final   05/26/2019 4.6 3.5 - 5.0 mmol/L Final   05/25/2019 4.2 3.5 - 5.0 mmol/L Final     Carbon Dioxide, Total   Date Value Ref Range Status   05/28/2019 28 22 - 29 mmol/L Final   05/26/2019 21 (L) 22 - 29 mmol/L Final   05/25/2019 21 (L) 22 - 29 mmol/L Final     WBC Count   Date Value Ref  Range Status   05/28/2019 4.6 3.4 - 10.0 x10E9/L Final   05/26/2019 3.9 3.4 - 10.0 x10E9/L Final   05/25/2019 3.3 (L) 3.4 - 10.0 x10E9/L Final     Hemoglobin   Date Value Ref Range Status   05/28/2019 11.2 (L) 13.6 - 17.5 g/dL Final   54/02/8118 14.7 (L) 13.6 - 17.5 g/dL Final   82/95/6213 08.6 (L) 13.6 - 17.5 g/dL Final     Hematocrit   Date Value Ref Range Status   05/28/2019 34.3 (L) 41.0 - 53.0 % Final   05/26/2019 32.7 (L) 41.0 - 53.0 % Final   05/25/2019 32.7 (L) 41.0 - 53.0 % Final     MCV   Date Value Ref Range Status   05/28/2019 85 80 - 100 fL Final   05/26/2019 84 80 - 100 fL Final   05/25/2019 84 80 - 100 fL Final     Platelet Count   Date Value Ref Range Status   05/28/2019 155 140 - 450 x10E9/L Final   05/26/2019 144 140 - 450 x10E9/L Final     Comment:     Repeated   05/25/2019 125 (L) 140 - 450 x10E9/L Final     Tacrolimus   Date Value Ref Range Status   05/28/2019 5.0 5.0 - 15.0 ug/L Final     Comment:     Performed using the [employer] Architect Chemiluminescent Microparticle Immunoassay (CMIA).   05/26/2019 3.6 (L) 5.0 - 15.0 ug/L Final     Comment:     Performed using the [employer] Architect Chemiluminescent Microparticle Immunoassay (CMIA).   05/25/2019 2.6 (L) 5.0 - 15.0 ug/L Final     Comment:      Performed using the [employer] Architect Chemiluminescent Microparticle Immunoassay (CMIA).     Microbiology Results (last 24 hours)     ** No results found for the last 24 hours. **        Radiology Results (last 24 hours)     ** No results found for the last 24 hours. **          Imaging Studies: I have reviewed as part of this evaluation the following studies renal tx USG done 05/25/2019.  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 Acute tubular epithelia injury; see comment.   2. No evidence of rejection, negative C4d stain.   2. No significant tubulointerstitial chronicity.   3. Moderate arteriosclerosis, donor-derived.     Special Stain Summary: (x8) PAS and trichrome special stains were  performed and examined to confirm the diagnosis. The findings are  consistent with our H&E-based light microscopic impression.  Six  additional PAS-stained level sections were evaluated, and no segmentally  sclerotic glomeruli are identified.     DSAs not done      Immunosuppressive management encounter following kidney transplant 05/21/2019     Induction agent/dose: Thymo lite  Regime: Tac/MMF   Corticosteroid plan: maintenance   cPRA: 10%      Transplant follow-up 05/21/2019     Category Problem Type of follow-up needed in clinic? Ordered? Scheduled? Comments   Nursing (e.g., drain/ Foley) HD tunneled line refer to IR for removal when appropriate Needs to be done in clinic weekly dressing changes in HD clinic until can be removed   Lab/ Micro Standard Tac Follow up results   Needs to be ordered None   Imaging studies/ procedures NA NA NA None   Referrals Uroglogy for stent removal NA Already ordered None   Delayed graft function and improving UOP, monitor for resolution        Ureteral stent retained of kidney transplant (CMS code) 05/21/2019     Implant Name Type Inv. Item Serial No. Manufacturer Lot No. LRB No. Used Action   STENT URETHRAL 6FR X 16CM DOUBLE J   [id number] - [id number] Stents-Non Des STENT URETHRAL 6FR X 16CM DOUBLE J   [id number]   [id number] Right 1 Implanted     Referral placed for outpatient stent removal      At risk for opportunistic infections 05/21/2019     Transplant Serology Workup:  Recipient        Lab Results   Component Value Date    HBCAB NEG 12/17/2018    HBSAG NEG 12/17/2018    HBABQ >800 12/17/2018    HCV NEG 12/17/2018    RPR Nonreactive 12/17/2018    CMVAB POS (A) 12/17/2018    VCAM NEG 12/17/2018    EBNA POS (A) 12/17/2018    TOXO NEG 12/17/2018         Donor        Cadaver Donor UNOS ID (no units)   Date Value   05/20/2019 [id number]     Match ID: [id number]          Induction Plan:   Thymo + steroid maintenance     Plan for PPX:   Toxo prophylaxis: (D +/R - Toxo MISMATCH), Septra DS 1 tab PO Daily x 1 month (06/19/2019), then qMWF x 11 months more (05/19/2020) -- 1 year total for Toxo Mismatch   PCP prophylaxis: Septra DS as per above for Toxo ppx   CMV prophylaxis: (D +/R +), Valcyte 900 mg PO Daily (adjusted for renal function) x 3 months (08/18/2019)   Fungal prophylaxis: Fluconazole 100 mg PO q7days x 1 month (06/18/2019)  HBV prophylaxis: Not necessary             At risk of infection transmitted from donor 05/21/2019     Patient underwent transplant with no known donor derived risk factors: Donor is not CDC high risk, not HBV core ab + and there are no known donor derived infections. No need for treatment or monitoring beyond the usual opportunistic infection prophylaxis per protocol. Donor with no known malignancies.  The donor's CMV status is IgG positive and the recipient's CMV status is IgG positive.  Prophylaxis will be determined by risk of CMV transmission.                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     Dialysis Type: HD Hemodialysis  Dialysis Center: [facility] /  [facility]         Phone: [phone]        Fax: [phone]       Address: 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 The pharmacological perfusion study is negative for ischemia.  2. There is not previous study available.  3. The global LV systolic function is normal.  4. Further evaluation with medical management is necessary at this time.  **Post-stress gated SPECT images demonstrated normal contractility with a calculated LVEF of 61%    ECHO TTE (03/05/2019)   1. Normal left ventricular size with moderate concentric left ventricular hypertrophy. EF 65%  2. grade 2 pseudo-normal diastolic LV dysfunction is present.  3. Overall left ventricular systolic function is grossly normal.  4. Mildly enlarged left atrium.  5. The aortic valve is structurally and functionally normal.  6. Mild thickened and/or calcified mitral leaflets.  7. There is trace mitral regurgitation.  8. Mild tricuspid insufficiency with normal estimated right heart pressure. RVSP 19.00 mmHg      EKG (12/23/2018)  Sinus rhythm  Consider inferior infarct    HOLTER MONITOR (10/22/2018)  IMPRESSION:      This  24-hour  Holter  monitor  is  suggestive of  sinus  rhythm with  sinus tachycardia.      There were  PVCs and PACs. There were no  sustained  arrhythmias seen.              Health care maintenance 04/12/2019     COLONOSCOPY (04/02/2018)  Melanosis in the colon.  No specimens collected.          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  Been on HD since 2010.  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       Assessment and Plan:    Immunosuppressive management encounter following kidney transplant  Last tac pre D/C was 3.6 as of 12.8.2020 and dose currently 7 bid and waiting results from today. Goal 6-8 (DGF, recent bx showing no rejection, and cPRA 10%). MMF 1 gm bid, pred tapering.     Condition: chronic   Status: stable/persistent    Addendum: tac trough 5.0 as of 05/28/2019, would hold the dose steady for  now given persistent DGF, and slowly recovering allograft function.       Deceased-donor kidney transplant recipient   Remains in DGF last Cr 8.75 on 12/10 (day after his MWF dialysis at [facility]). Making a little more UOP 3-400 cc/day (anric pre transplant).   UPC fluctuating last 2.389 g/g as of 12/10 and it had been coming down steadily as he started making more UOP. Will continue to monitor for now.  Cause biopsy on 05/25/2019 showed no recurrent FSGS.     Condition: chronic   Status: unstable/persistent        At risk for opportunistic infections  Continued Septra DS daily thru 06/19/2019 for PJP and toxo ppx,valcyte 450 biw thru 08/18/2019 pending renal function improvement, fluc weekly thru 06/18/2019.     Condition: chronic   Status: stable/persistent      FSGS (focal segmental glomerulosclerosis)  UPc fluctuating with recent UPC 2.389 g/g as of 05/28/2019, but it has steadily been declining since UOP increased, and recent bx 12/7 without FSGS.     Condition: chronic   Status: stable/persistent          HTN (hypertension)  Well controlled on clonidine and amlodipine with goal SBP 130-150's (mod arteriosclerosis on allograft in DGF avoid hypotensive eipsodes).     Condition: chronic   Status: stable/persistent        Nutrition: Pt. appears well developed and well-nourished. No Dietary consult needed at this time     Pharmacy: Current medications reviewed and discussed with pt. No need for Pharmacy consult at this time.     Continue to monitor Cr, electrolytes, CBC, and immunosuppression labs.  Discussed incision care, activity restrictions, and reviewed signs & symptoms to report to Kidney Transplant Clinic.  Explained lab schedule and next clinic appointment at [facility] to patient.     Risk Assessment:  Due to the chronic nature of the immunosuppressive medications the patient is at continued high risk of toxicity  especially nephrotoxicity, infections and malignancy. The risk of rejection is high.      Billey Chang, MD  Kidney Transplant Service  [facility]       Next Followup visit will be in 1 week  Lab frequency twice weekly

## 2019-05-28 NOTE — Assessment & Plan Note (Signed)
Continued Septra DS daily thru 06/19/2019 for PJP and toxo ppx,valcyte 450 biw thru 08/18/2019 pending renal function improvement, fluc weekly thru 06/18/2019.     Condition: chronic   Status: stable/persistent

## 2019-05-28 NOTE — Assessment & Plan Note (Signed)
Well controlled on clonidine and amlodipine with goal SBP 130-150's (mod arteriosclerosis on allograft in DGF avoid hypotensive eipsodes).     Condition: chronic   Status: stable/persistent

## 2019-05-29 LAB — HLA ANTIBODY - CLASS I SINGLE

## 2019-05-29 LAB — HLA ANTIBODY REPORT

## 2019-05-29 LAB — HLA ANTIBODY - CLASS II SINGLE

## 2019-05-29 LAB — ABO/RH
ABO/RH(D): AB NEG
ABO/RH(D): AB NEG

## 2019-05-30 LAB — ECG 12-LEAD
Atrial Rate: 85 {beats}/min
Calculated P Axis: 12 degrees
Calculated R Axis: -8 degrees
P-R Interval: 182 ms
QRS Duration: 80 ms
QT Interval: 394 ms
QTcb: 468 ms
Ventricular Rate: 85 {beats}/min

## 2019-06-04 ENCOUNTER — Ambulatory Visit: Admit: 2019-06-04 | Discharge: 2019-06-04 | Payer: MEDICARE | Attending: Nephrology | Primary: Physician

## 2019-06-04 ENCOUNTER — Ambulatory Visit: Admit: 2019-06-04 | Payer: MEDICARE | Primary: Physician

## 2019-06-04 DIAGNOSIS — Z940 Kidney transplant status: Secondary | ICD-10-CM

## 2019-06-04 DIAGNOSIS — D631 Anemia in chronic kidney disease: Secondary | ICD-10-CM

## 2019-06-04 DIAGNOSIS — N051 Unspecified nephritic syndrome with focal and segmental glomerular lesions: Secondary | ICD-10-CM

## 2019-06-04 DIAGNOSIS — Z79899 Other long term (current) drug therapy: Secondary | ICD-10-CM

## 2019-06-04 DIAGNOSIS — I129 Hypertensive chronic kidney disease with stage 1 through stage 4 chronic kidney disease, or unspecified chronic kidney disease: Secondary | ICD-10-CM

## 2019-06-04 DIAGNOSIS — I10 Essential (primary) hypertension: Secondary | ICD-10-CM

## 2019-06-04 DIAGNOSIS — Z4822 Encounter for aftercare following kidney transplant: Secondary | ICD-10-CM

## 2019-06-04 DIAGNOSIS — Z9189 Other specified personal risk factors, not elsewhere classified: Secondary | ICD-10-CM

## 2019-06-04 LAB — BASIC METABOLIC PANEL (NA, K,
Anion Gap: 14 (ref 4–14)
BUN/Creatinine Ratio: 9 (calc) (ref 6–22)
Calcium, total, Serum / Plasma: 8.7 mg/dL (ref 8.4–10.5)
Calcium, total, Serum / Plasma: 9.1 mg/dL (ref 8.6–10.3)
Carbon Dioxide, Total: 29 mmol/L (ref 22–29)
Carbon Dioxide, Total: 33 mmol/L — ABNORMAL HIGH (ref 20–32)
Chloride, Serum / Plasma: 100 mmol/L — ABNORMAL LOW (ref 101–110)
Chloride, Serum / Plasma: 98 mmol/L (ref 98–110)
Creatinine, Serum / Plasma: 3.62 mg/dL — ABNORMAL HIGH (ref 0.60–1.35)
Creatinine: 3.74 mg/dL — ABNORMAL HIGH (ref 0.73–1.24)
Glucose, non-fasting: 104 mg/dL (ref 70–199)
Glucose: 130 mg/dL — ABNORMAL HIGH (ref 65–99)
Potassium, Serum / Plasma: 3.3 mmol/L — ABNORMAL LOW (ref 3.5–5.0)
Potassium, Serum / Plasma: 3.6 mmol/L (ref 3.5–5.3)
Sodium, Serum / Plasma: 143 mmol/L (ref 135–145)
Sodium, Serum / Plasma: 139 mmol/L (ref 135–146)
Urea Nitrogen, Serum / Plasma: 32 mg/dL — ABNORMAL HIGH (ref 7–25)
Urea Nitrogen, Serum / Plasma: 33 mg/dL — ABNORMAL HIGH (ref 7–25)
eGFR - high estimate: 22 mL/min — ABNORMAL LOW (ref >59)
eGFR - high estimate: 22 mL/min/{1.73_m2} — ABNORMAL LOW (ref >=60)
eGFR - low estimate: 19 mL/min — ABNORMAL LOW (ref >59)
eGFR - low estimate: 19 mL/min/{1.73_m2} — ABNORMAL LOW (ref >=60)

## 2019-06-04 LAB — COMPLETE BLOOD COUNT WITH DIFF
% Basophils: 0.2 %
% Eosinophils: 0.5 %
% Monocytes: 5.8 %
% Neutrophils: 91.1 %
Abs Basophils: 0.02 10*9/L (ref 0.00–0.10)
Abs Basophils: 13 cells/uL (ref 0–200)
Abs Eosinophils: 0.02 10*9/L (ref 0.00–0.40)
Abs Eosinophils: 33 cells/uL (ref 15–500)
Abs Imm Granulocytes: 0.05 10*9/L (ref <0.10)
Abs Lymphocytes: 0.18 10*9/L — ABNORMAL LOW (ref 1.00–3.40)
Abs Lymphocytes: 158 cells/uL — ABNORMAL LOW (ref 850–3900)
Abs Monocytes: 0.5 10*9/L (ref 0.20–0.80)
Abs Monocytes: 383 cells/uL (ref 200–950)
Abs NRBC: 0 cells/uL
Abs Neutrophils: 7.9 10*9/L — ABNORMAL HIGH (ref 1.80–6.80)
Abs Neutrophils: 6013 cells/uL (ref 1500–7800)
Hematocrit: 32.5 % — ABNORMAL LOW (ref 41.0–53.0)
Hematocrit: 32.7 % — ABNORMAL LOW (ref 38.5–50.0)
Hemoglobin: 10.7 g/dL — ABNORMAL LOW (ref 13.6–17.5)
Hemoglobin: 10.7 g/dL — ABNORMAL LOW (ref 13.2–17.1)
Lymphs: 2.4 %
MCH: 28.3 pg (ref 26.0–34.0)
MCH: 27.7 pg (ref 27.0–33.0)
MCHC: 32.9 g/dL (ref 31.0–36.0)
MCHC: 32.7 g/dL (ref 32.0–36.0)
MCV: 86 fL (ref 80–100)
MCV: 84.7 fL (ref 80.0–100.0)
MPV: 11.2 fL (ref 7.5–12.5)
Platelet Count: 231 10*9/L (ref 140–450)
Platelet Count: 204 10*3/uL (ref 140–400)
RBC Count: 3.78 10*12/L — ABNORMAL LOW (ref 4.40–5.90)
RBC Count: 3.86 10*6/uL — ABNORMAL LOW (ref 4.20–5.80)
RDW: 12.6 % (ref 11.0–15.0)
WBC Count: 8.7 10*9/L (ref 3.4–10.0)
WBC Count: 6.6 10*3/uL (ref 3.8–10.8)

## 2019-06-04 LAB — MAGNESIUM, SERUM / PLASMA
Magnesium, Serum / Plasma: 1.9 mg/dL (ref 1.6–2.6)
Magnesium, Serum / Plasma: 1.9 mg/dL (ref 1.5–2.5)

## 2019-06-04 LAB — PROTEIN, TOTAL, RANDOM URINE
Creatinine, Random Urine: 76 mg/dL (ref 20–320)
Prot Concentration,UR: 34 mg/dL
Prot Concentration,UR: 50 mg/dL — ABNORMAL HIGH (ref 5–25)
Protein/Creat Ratio, Urine: 269 mg/g crea — ABNORMAL HIGH (ref <150)
Protein/Creat Ratio, Urine: 658 mg/g creat — ABNORMAL HIGH (ref 22–128)
Protein/Creatinine Ratio, Rand: 0.658 mg/mg creat — ABNORMAL HIGH (ref 0.022–0.128)

## 2019-06-04 LAB — BK VIRUS, DNA, QUANTITATIVE, P: BK Virus DNA: NOT DETECTED copies/mL

## 2019-06-04 LAB — TACROLIMUS LEVEL: Tacrolimus: 5 ug/L (ref 5.0–15.0)

## 2019-06-04 LAB — PHOSPHORUS, SERUM / PLASMA
Phosphorus, Serum / Plasma: 2.5 mg/dL (ref 2.3–4.7)
Phosphorus, Serum / Plasma: 3.2 mg/dL (ref 2.5–4.5)

## 2019-06-04 LAB — TACROLIMUS, HIGHLY SENSITIVE,: Tacrolimus: 9 mcg/L

## 2019-06-04 LAB — CREATININE, RANDOM URINE: Creatinine, Random Urine: 126.21 mg/dL

## 2019-06-04 NOTE — Progress Notes (Signed)
Post Kidney Transplant Follow-up Visit: Teleheath Video Visit    I performed this consultation using real-time Telehealth tools, including a live video connection between my location and the patient's location. Prior to initiating the consultation, I obtained informed verbal consent to perform this consultation using Telehealth tools and answered all the questions about the Telehealth interaction.    Name: Cody Myers  MRN: [id number]  Date of Service: 06/04/2019    Chief Complaint   Patient presents with    Kidney Transplant Follow-up       HISTORY OF PRESENT ILLNESS:  Cody Myers is a 43 y.o. year old male who is s/p kidney transplant on 05/20/2019. The patient is here today for a post kidney transplant follow up visit with monitoring and adjustment of the immunosuppressive medications.   During the clinic visit I have reviewed the patient's past medical, surgical, social and family history. There have been no changes since the last clinic visit.    Pt doing well since last visit  BP controlled  Still not HD  Making some urine    Patient is disabled    Patient's current level of function is: 100% = Normal, no complaints, no evidence of disease,    Location Affected: Kidney  Duration : Persistent  Severity of condition : s/p Kidney Transplant  Exacerbating Factors: on immunosuppression    During the clinic visit I reviewed the current medication list.    In the interim since last visit, there have been no illnesses or hospitalizations.    Patient's allergies, medications, past medical, surgical, family and social histories were reviewed and updated as appropriate.    Current Outpatient Medications   Medication Sig Dispense Refill    albuterol 90 mcg/actuation metered dose inhaler Inhale 1-2 puffs into the lungs every 6 (six) hours as needed for Wheezing or Shortness of Breath         amLODIPine (NORVASC) 10 mg tablet Take 1 tablet (10 mg total) by mouth daily 30 tablet 5     docusate sodium (COLACE) 250 mg capsule Take 1 capsule (250 mg total) by mouth Twice a day Hold for loose stools 100 capsule 0    fluconazole (DIFLUCAN) 100 mg tablet Take 1 tablet (100 mg total) by mouth every Thursday Stop after 06/18/2019 4 tablet 0    mycophenolate (CELLCEPT) 250 mg capsule Take 4 capsules (1,000 mg total) by mouth Twice a day or as directed 240 capsule 11    omeprazole (PRILOSEC) 20 mg capsule Take 1 capsule (20 mg total) by mouth daily Stop after 08/18/2019. May use as needed thereafter. 30 capsule 2    oxyCODONE (ROXICODONE) 5 mg tablet Take 1 tablet (5 mg total) by mouth every 6 (six) hours as needed (severe pain) 8 tablet 0    predniSONE (DELTASONE) 10 mg tablet Take /day 12/9-12/15, /day 12/16-12/22, /day 12/23-12/29. 42 tablet 0    senna (SENOKOT) 8.6 mg tablet Take 2 tablets (17.2 mg total) by mouth nightly as needed for Constipation 100 tablet 0    sucroferric oxyhydroxide (VELPHORO) 500 mg tablet Chew 1,000 mg by mouth 3 (three) times daily with meals         sulfamethoxazole-trimethoprim (SEPTRA DS) 800-160 mg tablet Take 1 tablet by mouth daily through 06/19/2019, then 1 tablet every Monday, Wednesday, and Friday Stop after 05/19/2020 30 tablet 4    tacrolimus (PROGRAF) 1 mg capsule Take by mouth 7 mg in AM and 7 mg in PM or as directed 720 capsule 11  valGANciclovir (VALCYTE) 450 mg tablet Take 450 mg by mouth on mondays and thursdays Or as directed. Stop after 08/18/2019 60 tablet 2    acetaminophen (TYLENOL) 500 mg tablet Take 2 tablets (1,000 mg total) by mouth every 8 (eight) hours as needed for Pain (mild pain) Max = 3000 mg/day (Patient not taking: Reported on 06/04/2019  ) 50 tablet 0    ciprofloxacin HCl (CIPRO) 500 mg tablet Take 1 tablet (500 mg total) by mouth Twice a day Starting night before stent removal appointment (Patient not taking: Reported on 06/04/2019  ) 3 tablet 0     oxyCODONE-acetaminophen (PERCOCET) 10-325 mg tablet Take 1 tablet by mouth every 8 (eight) hours as needed for Pain (Lower back pain)         predniSONE (DELTASONE) 5 mg tablet Take 1 tablet (5 mg total) by mouth daily Starting 06/17/19 onwards. 100 tablet 3     No current facility-administered medications for this visit.        Review of Systems:  A complete ROS was performed  General: no fevers/chills, malaise, weight loss or weight gain  Pulm: no recent cough or shortness of breath  Cardiovascular: no chest pain, palpitations, edema.   GI: no nausea/vomiting, change in appetite, constipation or diarrhea  GU: no gross hematuria, dysuria.  No pain over allograft  MSK: no arthralgias/myalgias  Derm: no rash  Neuro: no numbness, weakness, confusion, insomnia or tremors  Endo: no polyuria/polydipsia, hyperglycemia  All other systems were reviewed and were otherwise negative.    Physical Exam (Limited due to Video Visit):   Vitals: Unable to obtain since Video Visit  General: NAD  Psychiatric: Normal: Good insight, appropriate, oriented x 3, no mood disorder     Labs:  I have reviewed labs from [facility] as part of the encounter today.    Creatinine   Date Value Ref Range Status   06/04/2019 3.74 (H) 0.73 - 1.24 mg/dL Final   05/28/2019 8.75 (H) 0.73 - 1.24 mg/dL Final   05/26/2019 12.75 (H) 0.73 - 1.24 mg/dL Final     Creatinine, Serum / Plasma   Date Value Ref Range Status   06/01/2019 3.62 (H) 0.60 - 1.35 mg/dL Final     Urea Nitrogen, Serum / Plasma   Date Value Ref Range Status   06/04/2019 32 (H) 7 - 25 mg/dL Final   06/01/2019 33 (H) 7 - 25 mg/dL Final   05/28/2019 72 (H) 7 - 25 mg/dL Final     Potassium, Serum / Plasma   Date Value Ref Range Status   06/04/2019 3.3 (L) 3.5 - 5.0 mmol/L Final   06/01/2019 3.6 3.5 - 5.3 mmol/L Final   05/28/2019 3.7 3.5 - 5.0 mmol/L Final     Carbon Dioxide, Total   Date Value Ref Range Status   06/04/2019 29 22 - 29 mmol/L Final   06/01/2019 33 (H) 20 - 32 mmol/L Final    05/28/2019 28 22 - 29 mmol/L Final     WBC Count   Date Value Ref Range Status   06/04/2019 8.7 3.4 - 10.0 x10E9/L Final   06/01/2019 6.6 3.8 - 10.8 Thousand/uL Final   05/28/2019 4.6 3.4 - 10.0 x10E9/L Final     Hemoglobin   Date Value Ref Range Status   06/04/2019 10.7 (L) 13.6 - 17.5 g/dL Final   06/01/2019 10.7 (L) 13.2 - 17.1 g/dL Final   05/28/2019 11.2 (L) 13.6 - 17.5 g/dL Final     Hematocrit  Date Value Ref Range Status   06/04/2019 32.5 (L) 41.0 - 53.0 % Final   06/01/2019 32.7 (L) 38.5 - 50.0 % Final   05/28/2019 34.3 (L) 41.0 - 53.0 % Final     MCV   Date Value Ref Range Status   06/04/2019 86 80 - 100 fL Final   06/01/2019 84.7 80.0 - 100.0 fL Final   05/28/2019 85 80 - 100 fL Final     Platelet Count   Date Value Ref Range Status   06/04/2019 231 140 - 450 x10E9/L Final   06/01/2019 204 140 - 400 Thousand/uL Final   05/28/2019 155 140 - 450 x10E9/L Final     Tacrolimus   Date Value Ref Range Status   06/04/2019 5.0 5.0 - 15.0 ug/L Final     Comment:     Performed using the [employer] Architect Chemiluminescent Microparticle Immunoassay (CMIA).   05/28/2019 5.0 5.0 - 15.0 ug/L Final     Comment:     Performed using the Abbott Architect Chemiluminescent Microparticle Immunoassay (CMIA).   05/26/2019 3.6 (L) 5.0 - 15.0 ug/L Final     Comment:     Performed using the Abbott Architect Chemiluminescent Microparticle Immunoassay (CMIA).     Microbiology Results (last 24 hours)     ** No results found for the last 24 hours. **        Radiology Results (last 24 hours)     ** No results found for the last 24 hours. 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 Acute tubular epithelia injury; see comment.   2. No evidence of rejection, negative C4d stain.   2. No significant tubulointerstitial chronicity.   3. Moderate arteriosclerosis, donor-derived.     Special Stain Summary: (x8) PAS and trichrome special stains were  performed and examined to confirm the diagnosis. The findings are  consistent with our H&E-based light microscopic impression. Six  additional PAS-stained level sections were evaluated, and no segmentally  sclerotic glomeruli are identified.     DSAs not done      Immunosuppressive management encounter following kidney transplant 05/21/2019     Induction agent/dose: Thymo lite  Regime: Tac/MMF  Corticosteroid plan: maintenance   cPRA: 10%      Transplant follow-up 05/21/2019     Category Problem Type of follow-up needed in clinic? Ordered? Scheduled? Comments   Nursing (e.g., drain/ Foley) HD tunneled line refer to IR for removal when appropriate Needs to be done in clinic weekly dressing changes in HD clinic until can be removed   Lab/ Micro Standard Tac Follow up results   Needs to be ordered None   Imaging studies/ procedures NA NA NA  None   Referrals Uroglogy for stent removal NA Already ordered None   Delayed graft function and improving UOP, monitor for resolution        Ureteral stent retained of kidney transplant (CMS code) 05/21/2019     Implant Name Type Inv. Item Serial No. Manufacturer Lot No. LRB No. Used Action   STENT URETHRAL 6FR X 16CM DOUBLE J   [id number] - [id number] Stents-Non Des STENT URETHRAL 6FR X 16CM DOUBLE J   [id number]   [id number] Right 1 Implanted      Referral placed for outpatient stent removal      At risk for opportunistic infections 05/21/2019     Transplant Serology Workup:  Recipient        Lab Results   Component Value Date    HBCAB NEG 12/17/2018    HBSAG NEG 12/17/2018    HBABQ >800 12/17/2018    HCV NEG 12/17/2018    RPR Nonreactive 12/17/2018    CMVAB POS (A) 12/17/2018    VCAM NEG 12/17/2018    EBNA POS (A) 12/17/2018    TOXO NEG 12/17/2018         Donor        Cadaver Donor UNOS ID (no units)   Date Value   05/20/2019 [id number]     Match ID: [id number]          Induction Plan:   Thymo + steroid maintenance     Plan for PPX:   Toxo prophylaxis: (D +/R - Toxo MISMATCH), Septra DS 1 tab PO Daily x 1 month (06/19/2019), then qMWF x 11 months more (05/19/2020) -- 1 year total for Toxo Mismatch   PCP prophylaxis: Septra DS as per above for Toxo ppx   CMV prophylaxis: (D +/R +), Valcyte 900 mg PO Daily (adjusted for renal function) x 3 months (08/18/2019)   Fungal prophylaxis: Fluconazole 100 mg PO q7days x 1 month (06/18/2019)  HBV prophylaxis: Not necessary            At risk of infection transmitted from donor 05/21/2019     Patient underwent transplant with no known donor derived risk factors: Donor is not CDC high risk, not HBV core ab + and there are no known donor derived infections. No need for treatment or monitoring beyond the usual opportunistic infection prophylaxis per protocol. Donor with no known malignancies.  The donor's CMV status is IgG positive and the recipient's CMV status is IgG positive.  Prophylaxis will be determined by risk of CMV transmission.                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     Dialysis Type: HD Hemodialysis  Dialysis Center: [facility]         Phone: [phone]        Fax: [phone]       Address: 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  Been on HD since 2010.  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 appears well developed and well-nourished. No Dietary consult needed at this time     Pharmacy: Current medications reviewed and discussed with pt. No need for Pharmacy consult at this time.     Continue to monitor Cr, electrolytes, CBC, and immunosuppression labs.  Discussed incision care, activity restrictions, and reviewed signs & symptoms to report to Kidney Transplant Clinic.  Explained lab schedule and next clinic appointment at [facility] to patient.     Clinic Follow-up:  Return to clinic in 1 week    Risk Assessment:   Due to the chronic nature of the immunosuppressive medications the patient is at continued high risk of toxicity  especially nephrotoxicity, infections and malignancy.           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

## 2019-06-08 NOTE — Assessment & Plan Note (Signed)
Controlled    Continue amlodipine 10mg po daily

## 2019-06-08 NOTE — Assessment & Plan Note (Signed)
Pt's etiology of ESRD was biopsy-proven FSGS     Will monitor UPC closely

## 2019-06-08 NOTE — Assessment & Plan Note (Signed)
H/H at goal    Continue to monitor

## 2019-06-08 NOTE — Assessment & Plan Note (Signed)
cPRA 10  Continue tacrolimus 7mg  po BID with goal trough 8-10  Continue MMF 1g po BID  Continue prednisone taper to maintenance

## 2019-06-08 NOTE — Assessment & Plan Note (Signed)
Continue fluconazole weekly until 12/31  Continue Septra daily until 1/1, followed by MWF until 05/19/2020  Continue valcyte until 3/2

## 2019-06-08 NOTE — Assessment & Plan Note (Signed)
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

## 2019-06-09 ENCOUNTER — Ambulatory Visit: Admit: 2019-06-09 | Discharge: 2019-06-09 | Payer: MEDICARE | Attending: Nephrology | Primary: Physician

## 2019-06-09 ENCOUNTER — Ambulatory Visit: Admit: 2019-06-09 | Payer: MEDICARE | Primary: Physician

## 2019-06-09 DIAGNOSIS — Z940 Kidney transplant status: Secondary | ICD-10-CM

## 2019-06-09 DIAGNOSIS — I10 Essential (primary) hypertension: Secondary | ICD-10-CM

## 2019-06-09 DIAGNOSIS — D631 Anemia in chronic kidney disease: Secondary | ICD-10-CM

## 2019-06-09 DIAGNOSIS — N186 End stage renal disease: Secondary | ICD-10-CM

## 2019-06-09 DIAGNOSIS — Z9189 Other specified personal risk factors, not elsewhere classified: Secondary | ICD-10-CM

## 2019-06-09 DIAGNOSIS — Z79899 Other long term (current) drug therapy: Secondary | ICD-10-CM

## 2019-06-09 DIAGNOSIS — Z298 Encounter for other specified prophylactic measures: Secondary | ICD-10-CM

## 2019-06-09 DIAGNOSIS — Z992 Dependence on renal dialysis: Secondary | ICD-10-CM

## 2019-06-09 DIAGNOSIS — N051 Unspecified nephritic syndrome with focal and segmental glomerular lesions: Secondary | ICD-10-CM

## 2019-06-09 DIAGNOSIS — M899 Disorder of bone, unspecified: Secondary | ICD-10-CM

## 2019-06-09 LAB — BASIC METABOLIC PANEL (NA, K,
Anion Gap: 9 (ref 4–14)
Calcium, total, Serum / Plasma: 9.2 mg/dL (ref 8.4–10.5)
Carbon Dioxide, Total: 27 mmol/L (ref 22–29)
Chloride, Serum / Plasma: 105 mmol/L (ref 101–110)
Creatinine: 2.52 mg/dL — ABNORMAL HIGH (ref 0.73–1.24)
Glucose, non-fasting: 83 mg/dL (ref 70–199)
Potassium, Serum / Plasma: 3.3 mmol/L — ABNORMAL LOW (ref 3.5–5.0)
Sodium, Serum / Plasma: 141 mmol/L (ref 135–145)
Urea Nitrogen, Serum / Plasma: 24 mg/dL (ref 7–25)
eGFR - high estimate: 35 mL/min — ABNORMAL LOW (ref >59)
eGFR - low estimate: 30 mL/min — ABNORMAL LOW (ref >59)

## 2019-06-09 LAB — COMPLETE BLOOD COUNT WITH DIFF
Abs Basophils: 0.03 10*9/L (ref 0.00–0.10)
Abs Eosinophils: 0.04 10*9/L (ref 0.00–0.40)
Abs Imm Granulocytes: 0.02 10*9/L (ref <0.10)
Abs Lymphocytes: 0.19 10*9/L — ABNORMAL LOW (ref 1.00–3.40)
Abs Monocytes: 0.55 10*9/L (ref 0.20–0.80)
Abs Neutrophils: 4.8 10*9/L (ref 1.80–6.80)
Hematocrit: 34.1 % — ABNORMAL LOW (ref 41.0–53.0)
Hemoglobin: 10.8 g/dL — ABNORMAL LOW (ref 13.6–17.5)
MCH: 27.8 pg (ref 26.0–34.0)
MCHC: 31.7 g/dL (ref 31.0–36.0)
MCV: 88 fL (ref 80–100)
Platelet Count: 230 10*9/L (ref 140–450)
RBC Count: 3.88 10*12/L — ABNORMAL LOW (ref 4.40–5.90)
WBC Count: 5.6 10*9/L (ref 3.4–10.0)

## 2019-06-09 LAB — MAGNESIUM, SERUM / PLASMA: Magnesium, Serum / Plasma: 1.8 mg/dL (ref 1.6–2.6)

## 2019-06-09 LAB — TACROLIMUS LEVEL: Tacrolimus: 5.2 ug/L (ref 5.0–15.0)

## 2019-06-09 LAB — CREATININE, RANDOM URINE: Creatinine, Random Urine: 155.68 mg/dL

## 2019-06-09 LAB — PHOSPHORUS, SERUM / PLASMA: Phosphorus, Serum / Plasma: 2.5 mg/dL (ref 2.3–4.7)

## 2019-06-09 LAB — PROTEIN, TOTAL, RANDOM URINE
Prot Concentration,UR: 53 mg/dL
Protein/Creat Ratio, Urine: 340 mg/g crea — ABNORMAL HIGH (ref <150)

## 2019-06-09 MED ORDER — TACROLIMUS 1 MG CAPSULE, IMMEDIATE-RELEASE
1 | ORAL_CAPSULE | ORAL | 11 refills | Status: DC
Start: 2019-06-09 — End: 2019-08-10

## 2019-06-09 MED ORDER — VALGANCICLOVIR 450 MG TABLET
450 | ORAL_TABLET | Freq: Every day | ORAL | 2 refills | Status: DC
Start: 2019-06-09 — End: 2019-07-14

## 2019-06-09 NOTE — Telephone Encounter (Signed)
(  [name]: Pt had an appointment with Dr. Grant Fontana today.)        Agent: PL   Time Taken: 06/09/2019 12:28:56 PM    Kidney Patient  Facility - Lab Call - Order    For: [name])  From: Yevonne Aline  Lab: [facility]   Phone: [phone]  Patient Name: Cody Myers  DOB: 21-Oct-1975  Post or Pre Transplant: Post Transplant  Transplant Date: 07/19/1978  Message: Patient was told he no longer needs to be seen for dialysis she would like an order to confirm this is true.

## 2019-06-09 NOTE — Telephone Encounter (Signed)
Issue: Per Dr. Elson Areas visit today, ''no further need for dialysis; HD unit contacted today to cancel future HD."  However dialysis unit is calling stating they need an order to confirm this. Sent to Dr. Grant Fontana. Per Dr. Grant Fontana, she contacted them directly already today to provide this information.

## 2019-06-09 NOTE — Assessment & Plan Note (Signed)
Continue fluconazole weekly until 12/31  Continue Septra daily until 1/1, followed by MWF until 05/19/2020  Continue valcyte until 3/2

## 2019-06-09 NOTE — Assessment & Plan Note (Signed)
cPRA 10; received thymolite for induction  Tacrolimus level from yesterday and today pending; continue tacrolimus 7mg  po BID with goal trough 8-10  Continue MMF 1g po BID  Continue prednisone taper to maintenance

## 2019-06-09 NOTE — Assessment & Plan Note (Signed)
Pt's etiology of ESRD was biopsy-proven FSGS     Will monitor UPC closely

## 2019-06-09 NOTE — Assessment & Plan Note (Signed)
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

## 2019-06-09 NOTE — Progress Notes (Signed)
Tacrolimus level from this morning 5.2. Will increase tacrolimus to 9mg  po BID starting tonight. Notified patient via phone.

## 2019-06-09 NOTE — Assessment & Plan Note (Signed)
Phos 2.5    Will stop phos binders

## 2019-06-09 NOTE — Patient Instructions (Addendum)
1. Stop Velphoro  2. Increase valcyte to 450mg  once a day  3. Labs twice a week   4. Clinic on Monday 12/28; Labs at [facility] on Monday 12/28

## 2019-06-09 NOTE — Assessment & Plan Note (Signed)
Controlled    Continue amlodipine 10mg po daily

## 2019-06-09 NOTE — Assessment & Plan Note (Signed)
H/H at goal    Continue to monitor

## 2019-06-09 NOTE — Progress Notes (Signed)
POST-KIDNEY TRANSPLANT CLINIC NEPHROLOGY PROGRESS NOTE    Name: Cody Myers  MRN: [id number]  Date of Service: 06/09/2019    Chief Complaint   Patient presents with    Kidney Transplant Follow-up       HISTORY OF PRESENT ILLNESS:  Cody Myers is a 43 y.o. year old male who is s/p kidney transplant on 05/20/2019. The patient is here today for a post kidney transplant follow up visit with monitoring and adjustment of the immunosuppressive medications.   During the clinic visit I have reviewed the patient's past medical, surgical, social and family history. There have been no changes since the last clinic visit.    Pt doing well  UOP increased to 4L per day  Had HD yesterday   SBP at home 120-140s    Here with his son    Patient is disabled    Patient's current level of function is: 100% = Normal, no complaints, no evidence of disease,    Location Affected: Kidney  Duration : Persistent  Severity of condition : s/p Kidney Transplant  Exacerbating Factors: on immunosuppression    During the clinic visit I reviewed the current medication list.    In the interim since last visit, there have been no illnesses or hospitalizations.    Patient's allergies, medications, past medical, surgical, family and social histories were reviewed and updated as appropriate.    Current Outpatient Medications   Medication Sig Dispense Refill    amLODIPine (NORVASC) 10 mg tablet Take 1 tablet (10 mg total) by mouth daily 30 tablet 5    fluconazole (DIFLUCAN) 100 mg tablet Take 1 tablet (100 mg total) by mouth every Thursday Stop after 06/18/2019 4 tablet 0    mycophenolate (CELLCEPT) 250 mg capsule Take 4 capsules (1,000 mg total) by mouth Twice a day or as directed 240 capsule 11    omeprazole (PRILOSEC) 20 mg capsule Take 1 capsule (20 mg total) by mouth daily Stop after 08/18/2019. May use as needed thereafter. 30 capsule 2     predniSONE (DELTASONE) 10 mg tablet Take /day 12/9-12/15, /day 12/16-12/22, /day 12/23-12/29. 42 tablet 0    predniSONE (DELTASONE) 5 mg tablet Take 1 tablet (5 mg total) by mouth daily Starting 06/17/19 onwards. 100 tablet 3    acetaminophen (TYLENOL) 500 mg tablet Take 2 tablets (1,000 mg total) by mouth every 8 (eight) hours as needed for Pain (mild pain) Max = 3000 mg/day (Patient not taking: Reported on 06/04/2019  ) 50 tablet 0    albuterol 90 mcg/actuation metered dose inhaler Inhale 1-2 puffs into the lungs every 6 (six) hours as needed for Wheezing or Shortness of Breath         ciprofloxacin HCl (CIPRO) 500 mg tablet Take 1 tablet (500 mg total) by mouth Twice a day Starting night before stent removal appointment (Patient not taking: Reported on 06/04/2019  ) 3 tablet 0    docusate sodium (COLACE) 250 mg capsule Take 1 capsule (250 mg total) by mouth Twice a day Hold for loose stools 100 capsule 0    oxyCODONE (ROXICODONE) 5 mg tablet Take 1 tablet (5 mg total) by mouth every 6 (six) hours as needed (severe pain) 8 tablet 0    oxyCODONE-acetaminophen (PERCOCET) 10-325 mg tablet Take 1 tablet by mouth every 8 (eight) hours as needed for Pain (Lower back pain)         senna (SENOKOT) 8.6 mg tablet Take 2 tablets (17.2 mg total) by mouth nightly as needed  for Constipation 100 tablet 0    sulfamethoxazole-trimethoprim (SEPTRA DS) 800-160 mg tablet Take 1 tablet by mouth daily through 06/19/2019, then 1 tablet every Monday, Wednesday, and Friday Stop after 05/19/2020 30 tablet 4    tacrolimus (PROGRAF) 1 mg capsule Take by mouth 7 mg in AM and 7 mg in PM or as directed 720 capsule 11    valGANciclovir (VALCYTE) 450 mg tablet Take 1 tablet (450 mg total) by mouth daily Stop after 08/18/2019 60 tablet 2     No current facility-administered medications for this visit.        Review of Systems:  A complete ROS was performed  General: no fevers/chills, malaise, weight loss or weight gain   Pulm: no recent cough or shortness of breath  Cardiovascular: no chest pain, palpitations, edema.   GI: no nausea/vomiting, change in appetite, constipation or diarrhea  GU: no gross hematuria, dysuria.  No pain over allograft  MSK: no arthralgias/myalgias  Derm: no rash  Neuro: no numbness, weakness, confusion, insomnia or tremors  Endo: no polyuria/polydipsia, hyperglycemia  All other systems were reviewed and were otherwise negative.    Physical Exam:   Vitals:    06/09/19 1139   BP: 129/69   Pulse: 82   Temp: 36.4 C (97.5 F)   Weight: (!) 112.9 kg (249 lb)     General: NAD  HENT: Normal: Normocephalic and atraumatic. Normal: Non-icteric sclera, EOMI and PERRL   Respiratory: Normal: Lungs clear to auscultation & percussion w/o use of accessory muscles   Cardiovascular: Normal: regular rate and rhythm, no rubs, murmurs or gallops. No edema  Gastrointestinal: Normal: Nontender/nondistended w/o hepatosplenomegaly, mass, ascites or hernia   Heme/Lymphathic: Normal: No cervical or supraclavicular adenopathy   Musculoskeletal: Normal: Normal gait, station normal tone, mass, no clubbing, cyanosis, amputations   Integumentary: Normal: No icterus, lesions, rashes or skin breakdown   Psychiatric: Normal: Good insight, appropriate, oriented x 3, no mood disorder   Neurological: Normal: Cranial nerves grossly intact. No asterixis. Tremors no   GU: Normal: Allograft is on the  right,  and nontender  no bruit. Foley Catheter: no   Vascular access: R tunneled line   Incision site: incision site clean/ dry/ intact; with staples     Labs:  I have reviewed labs from [place] and local labs  as part of the encounter today.    Creatinine   Date Value Ref Range Status   06/09/2019 2.52 (H) 0.73 - 1.24 mg/dL Final   16/03/9603 5.40 (H) 0.73 - 1.24 mg/dL Final   98/04/9146 8.29 (H) 0.73 - 1.24 mg/dL Final     Creatinine, Serum / Plasma   Date Value Ref Range Status   06/08/2019 1.64 (H) 0.60 - 1.35 mg/dL Final    56/21/3086 5.78 (H) 0.60 - 1.35 mg/dL Final     Urea Nitrogen, Serum / Plasma   Date Value Ref Range Status   06/09/2019 24 7 - 25 mg/dL Final   46/96/2952 14 7 - 25 mg/dL Final   84/13/2440 32 (H) 7 - 25 mg/dL Final     Potassium, Serum / Plasma   Date Value Ref Range Status   06/09/2019 3.3 (L) 3.5 - 5.0 mmol/L Final   06/08/2019 3.6 3.5 - 5.3 mmol/L Final   06/04/2019 3.3 (L) 3.5 - 5.0 mmol/L Final     Carbon Dioxide, Total   Date Value Ref Range Status   06/09/2019 27 22 - 29 mmol/L Final   06/08/2019 31 20 - 32  mmol/L Final   06/04/2019 29 22 - 29 mmol/L Final     WBC Count   Date Value Ref Range Status   06/09/2019 5.6 3.4 - 10.0 x10E9/L Final   06/08/2019 5.0 3.8 - 10.8 Thousand/uL Final   06/04/2019 8.7 3.4 - 10.0 x10E9/L Final     Hemoglobin   Date Value Ref Range Status   06/09/2019 10.8 (L) 13.6 - 17.5 g/dL Final   16/03/9603 54.0 (L) 13.2 - 17.1 g/dL Final   98/04/9146 82.9 (L) 13.6 - 17.5 g/dL Final     Hematocrit   Date Value Ref Range Status   06/09/2019 34.1 (L) 41.0 - 53.0 % Final   06/08/2019 31.7 (L) 38.5 - 50.0 % Final   06/04/2019 32.5 (L) 41.0 - 53.0 % Final     MCV   Date Value Ref Range Status   06/09/2019 88 80 - 100 fL Final   06/08/2019 85.4 80.0 - 100.0 fL Final   06/04/2019 86 80 - 100 fL Final     Platelet Count   Date Value Ref Range Status   06/09/2019 230 140 - 450 x10E9/L Final   06/08/2019 207 140 - 400 Thousand/uL Final   06/04/2019 231 140 - 450 x10E9/L Final     Tacrolimus   Date Value Ref Range Status   06/04/2019 5.0 5.0 - 15.0 ug/L Final     Comment:     Performed using the [employer] Architect Chemiluminescent Microparticle Immunoassay (CMIA).   05/28/2019 5.0 5.0 - 15.0 ug/L Final     Comment:     Performed using the [employer] Architect Chemiluminescent Microparticle Immunoassay (CMIA).   05/26/2019 3.6 (L) 5.0 - 15.0 ug/L Final     Comment:     Performed using the [employer] Architect Chemiluminescent Microparticle Immunoassay (CMIA).     Microbiology Results (last 24 hours)      ** No results found for the last 24 hours. **        Radiology Results (last 24 hours)     ** No results found for the last 24 hours. 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 Acute tubular epithelia injury; see comment.   2. No evidence of rejection, negative C4d stain.   2. No significant tubulointerstitial chronicity.   3. Moderate arteriosclerosis, donor-derived.     Special Stain Summary: (x8) PAS and trichrome special stains were  performed and examined to confirm the diagnosis. The findings are  consistent with our H&E-based light microscopic impression. Six  additional PAS-stained level sections were evaluated, and no segmentally  sclerotic glomeruli are identified.     DSAs not done      Immunosuppressive management encounter following kidney transplant 05/21/2019  Induction agent/dose: Thymo lite  Regime: Tac/MMF  Corticosteroid plan: maintenance   cPRA: 10%      Transplant follow-up 05/21/2019     Category Problem Type of follow-up needed in clinic? Ordered? Scheduled? Comments   Nursing (e.g., drain/ Foley) HD tunneled line refer to IR for removal when appropriate Needs to be done in clinic weekly dressing changes in HD clinic until can be removed   Lab/ Micro Standard Tac Follow up results    Needs to be ordered None   Imaging studies/ procedures NA NA NA None   Referrals Uroglogy for stent removal NA Already ordered None   Delayed graft function and improving UOP, monitor for resolution        Ureteral stent retained of kidney transplant (CMS code) 05/21/2019     Implant Name Type Inv. Item Serial No. Manufacturer Lot No. LRB No. Used Action   STENT URETHRAL 6FR X 16CM DOUBLE J   [id number] - [id number] Stents-Non Des STENT URETHRAL 6FR X 16CM DOUBLE J   [id number]   [id number] Right 1 Implanted     Referral placed for outpatient stent removal      At risk for opportunistic infections 05/21/2019     Transplant Serology Workup:  Recipient        Lab Results   Component Value Date    HBCAB NEG 12/17/2018    HBSAG NEG 12/17/2018    HBABQ >800 12/17/2018    HCV NEG 12/17/2018    RPR Nonreactive 12/17/2018    CMVAB POS (A) 12/17/2018    VCAM NEG 12/17/2018    EBNA POS (A) 12/17/2018    TOXO NEG 12/17/2018         Donor        Cadaver Donor UNOS ID (no units)   Date Value   05/20/2019 [id number]     Match ID: [id number]          Induction Plan:   Thymo + steroid maintenance     Plan for PPX:   Toxo prophylaxis: (D +/R - Toxo MISMATCH), Septra DS 1 tab PO Daily x 1 month (06/19/2019), then qMWF x 11 months more (05/19/2020) -- 1 year total for Toxo Mismatch   PCP prophylaxis: Septra DS as per above for Toxo ppx   CMV prophylaxis: (D +/R +), Valcyte 900 mg PO Daily (adjusted for renal function) x 3 months (08/18/2019)   Fungal prophylaxis: Fluconazole 100 mg PO q7days x 1 month (06/18/2019)  HBV prophylaxis: Not necessary            At risk of infection transmitted from donor 05/21/2019      Patient underwent transplant with no known donor derived risk factors: Donor is not CDC high risk, not HBV core ab + and there are no known donor derived infections. No need for treatment or monitoring beyond the usual opportunistic infection prophylaxis per protocol. Donor with no known malignancies.  The donor's CMV status is IgG positive and the recipient's CMV status is IgG positive.  Prophylaxis will be determined by risk of CMV transmission.                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     Dialysis Type: HD Hemodialysis  Dialysis Center: [facility]         Phone: [phone]        Fax: [phone]       Address: [phone] [place]. 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  Been on HD since 2010.  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  Serum creat 1.64mg /dL was post dialysis labs; creat today 2.52mg /dL is nondialysis day labs   UOP increased to 4L per day     No further need for dialysis; HD unit contacted today to cancel future HD (was on HD MWF; last HD 12/21)  Has tunneled dialysis line --> if labs on Monday show continued improvement, will need to arrange for HD tunneled line removal  Continue to monitor renal function with twice weekly labs    Immunosuppressive management encounter following kidney transplant  cPRA 10; received thymolite for induction  Tacrolimus level from yesterday and today pending; continue tacrolimus 7mg  po BID with goal trough 8-10  Continue MMF 1g po BID  Continue prednisone taper to maintenance     At risk for opportunistic infections  Continue fluconazole weekly until 12/31  Continue Septra daily until 1/1, followed by MWF until 05/19/2020  Continue valcyte until 3/2    Chronic kidney disease-mineral and bone disorder  Phos 2.5    Will stop phos binders       Nutrition: Pt. appears well developed and well-nourished. No Dietary consult needed at this time     Pharmacy: Current medications reviewed and discussed with pt. No need for Pharmacy consult at this time.     Continue to monitor Cr, electrolytes, CBC, and immunosuppression labs.  Discussed incision care, activity restrictions, and reviewed signs & symptoms to report to Kidney Transplant Clinic.  [facility] lab schedule and [facility].     Clinic Follow-up:  Return to clinic in 1 week     Risk Assessment:  Due to the chronic nature of the immunosuppressive medications the patient is at continued high risk of toxicity  especially nephrotoxicity, infections and malignancy.     Colbert CoyerJun Atreyu Mak    Assistant Professor  Kidney Transplant Service  [facility]

## 2019-06-10 LAB — COMPLETE BLOOD COUNT WITH DIFF
% Basophils: 0.4 %
% Eosinophils: 0.4 %
% Monocytes: 8.7 %
% Neutrophils: 86.9 %
Abs Basophils: 20 cells/uL (ref 0–200)
Abs Eosinophils: 20 cells/uL (ref 15–500)
Abs Lymphocytes: 180 cells/uL — ABNORMAL LOW (ref 850–3900)
Abs Monocytes: 435 cells/uL (ref 200–950)
Abs NRBC: 0 cells/uL
Abs Neutrophils: 4345 cells/uL (ref 1500–7800)
Hematocrit: 31.7 % — ABNORMAL LOW (ref 38.5–50.0)
Hemoglobin: 10.2 g/dL — ABNORMAL LOW (ref 13.2–17.1)
Lymphs: 3.6 %
MCH: 27.5 pg (ref 27.0–33.0)
MCHC: 32.2 g/dL (ref 32.0–36.0)
MCV: 85.4 fL (ref 80.0–100.0)
MPV: 11 fL (ref 7.5–12.5)
Platelet Count: 207 10*3/uL (ref 140–400)
RBC Count: 3.71 10*6/uL — ABNORMAL LOW (ref 4.20–5.80)
RDW: 12.6 % (ref 11.0–15.0)
WBC Count: 5 10*3/uL (ref 3.8–10.8)

## 2019-06-10 LAB — PROTEIN, TOTAL, RANDOM URINE
Creatinine, Random Urine: 202 mg/dL (ref 20–320)
Prot Concentration,UR: 164 mg/dL — ABNORMAL HIGH (ref 5–25)
Protein/Creat Ratio, Urine: 812 mg/g creat — ABNORMAL HIGH (ref 22–128)
Protein/Creatinine Ratio, Rand: 0.812 mg/mg creat — ABNORMAL HIGH (ref 0.022–0.128)

## 2019-06-10 LAB — BASIC METABOLIC PANEL (NA, K,
BUN/Creatinine Ratio: 9 (calc) (ref 6–22)
Calcium, total, Serum / Plasma: 8.9 mg/dL (ref 8.6–10.3)
Carbon Dioxide, Total: 31 mmol/L (ref 20–32)
Chloride, Serum / Plasma: 104 mmol/L (ref 98–110)
Creatinine, Serum / Plasma: 1.64 mg/dL — ABNORMAL HIGH (ref 0.60–1.35)
Glucose: 102 mg/dL (ref 65–139)
Potassium, Serum / Plasma: 3.6 mmol/L (ref 3.5–5.3)
Sodium, Serum / Plasma: 141 mmol/L (ref 135–146)
Urea Nitrogen, Serum / Plasma: 14 mg/dL (ref 7–25)
eGFR - high estimate: 58 mL/min/{1.73_m2} — ABNORMAL LOW (ref >=60)
eGFR - low estimate: 50 mL/min/{1.73_m2} — ABNORMAL LOW (ref >=60)

## 2019-06-10 LAB — ABO/RH: ABO/RH(D): AB NEG

## 2019-06-10 LAB — TACROLIMUS, HIGHLY SENSITIVE,: Tacrolimus: 3.2 mcg/L — ABNORMAL LOW

## 2019-06-10 LAB — MAGNESIUM, SERUM / PLASMA: Magnesium, Serum / Plasma: 1.8 mg/dL (ref 1.5–2.5)

## 2019-06-10 LAB — PHOSPHORUS, SERUM / PLASMA: Phosphorus, Serum / Plasma: 2.2 mg/dL — ABNORMAL LOW (ref 2.5–4.5)

## 2019-06-10 NOTE — Telephone Encounter (Signed)
66M, s/p DDRT 05/20/19.     MD Plan of Care:   Per Dr. Marylee Floras, tac low 3.2, 06/08/19. Increase tacrolimus dose to 8 mg PO BID. Target 8-10 ng/ml. Currently taking 7 mg BID per Dr. Elson Areas note 06/09/19; med list sig has 9 mg BID.    RN action:   T/C to pt. Positive ID on outgoing vmail. Provided instructions for increased tac dose per Dr. Marylee Floras in detailed message. Reminded of plan for labs/appt at [facility] on Mon 12/28. Provided clinic # for questions.    Attempted to update tacrolimus sig to reflect dose change per Dr. Marylee Floras but unable to do so (diagnosis requested but unable to add in 'adjust sig' function). Added note to medication instead.

## 2019-06-15 ENCOUNTER — Ambulatory Visit: Admit: 2019-06-15 | Payer: MEDICARE | Primary: Physician

## 2019-06-15 ENCOUNTER — Ambulatory Visit: Admit: 2019-06-15 | Discharge: 2019-06-15 | Payer: MEDICARE | Attending: Nephrology | Primary: Physician

## 2019-06-15 DIAGNOSIS — Z9189 Other specified personal risk factors, not elsewhere classified: Secondary | ICD-10-CM

## 2019-06-15 DIAGNOSIS — Z79899 Other long term (current) drug therapy: Secondary | ICD-10-CM

## 2019-06-15 DIAGNOSIS — N186 End stage renal disease: Secondary | ICD-10-CM

## 2019-06-15 DIAGNOSIS — Z940 Kidney transplant status: Secondary | ICD-10-CM

## 2019-06-15 DIAGNOSIS — T8619 Other complication of kidney transplant: Secondary | ICD-10-CM

## 2019-06-15 DIAGNOSIS — Z992 Dependence on renal dialysis: Secondary | ICD-10-CM

## 2019-06-15 DIAGNOSIS — I10 Essential (primary) hypertension: Secondary | ICD-10-CM

## 2019-06-15 DIAGNOSIS — Z298 Encounter for other specified prophylactic measures: Secondary | ICD-10-CM

## 2019-06-15 DIAGNOSIS — N051 Unspecified nephritic syndrome with focal and segmental glomerular lesions: Secondary | ICD-10-CM

## 2019-06-15 DIAGNOSIS — Z960 Presence of urogenital implants: Secondary | ICD-10-CM

## 2019-06-15 LAB — COMPLETE BLOOD COUNT WITH DIFF
Abs Basophils: 0.03 10*9/L (ref 0.00–0.10)
Abs Eosinophils: 0.04 10*9/L (ref 0.00–0.40)
Abs Imm Granulocytes: 0.01 10*9/L (ref <0.10)
Abs Lymphocytes: 0.16 10*9/L — ABNORMAL LOW (ref 1.00–3.40)
Abs Monocytes: 0.26 10*9/L (ref 0.20–0.80)
Abs Neutrophils: 3.12 10*9/L (ref 1.80–6.80)
Hematocrit: 33.6 % — ABNORMAL LOW (ref 41.0–53.0)
Hemoglobin: 10.9 g/dL — ABNORMAL LOW (ref 13.6–17.5)
MCH: 27.4 pg (ref 26.0–34.0)
MCHC: 32.4 g/dL (ref 31.0–36.0)
MCV: 84 fL (ref 80–100)
Platelet Count: 264 10*9/L (ref 140–450)
RBC Count: 3.98 10*12/L — ABNORMAL LOW (ref 4.40–5.90)
WBC Count: 3.6 10*9/L (ref 3.4–10.0)

## 2019-06-15 LAB — BASIC METABOLIC PANEL (NA, K,
Anion Gap: 10 (ref 4–14)
Calcium, total, Serum / Plasma: 9.4 mg/dL (ref 8.4–10.5)
Carbon Dioxide, Total: 21 mmol/L — ABNORMAL LOW (ref 22–29)
Chloride, Serum / Plasma: 107 mmol/L (ref 101–110)
Creatinine: 2.12 mg/dL — ABNORMAL HIGH (ref 0.73–1.24)
Glucose, non-fasting: 96 mg/dL (ref 70–199)
Potassium, Serum / Plasma: 4.2 mmol/L (ref 3.5–5.0)
Sodium, Serum / Plasma: 138 mmol/L (ref 135–145)
Urea Nitrogen, Serum / Plasma: 28 mg/dL — ABNORMAL HIGH (ref 7–25)
eGFR - high estimate: 43 mL/min — ABNORMAL LOW (ref >59)
eGFR - low estimate: 37 mL/min — ABNORMAL LOW (ref >59)

## 2019-06-15 LAB — PROTEIN, TOTAL, RANDOM URINE
Prot Concentration,UR: 19 mg/dL
Protein/Creat Ratio, Urine: 253 mg/g crea — ABNORMAL HIGH (ref <150)

## 2019-06-15 LAB — PHOSPHORUS, SERUM / PLASMA: Phosphorus, Serum / Plasma: 3.5 mg/dL (ref 2.3–4.7)

## 2019-06-15 LAB — CREATININE, RANDOM URINE: Creatinine, Random Urine: 75.01 mg/dL

## 2019-06-15 LAB — TACROLIMUS LEVEL: Tacrolimus: 5.9 ug/L (ref 5.0–15.0)

## 2019-06-15 LAB — MAGNESIUM, SERUM / PLASMA: Magnesium, Serum / Plasma: 1.9 mg/dL (ref 1.6–2.6)

## 2019-06-15 NOTE — Progress Notes (Signed)
POST-KIDNEY TRANSPLANT CLINIC NEPHROLOGY PROGRESS NOTE    Name: Cody Myers  MRN: [id number]  Date of Service: 06/15/2019    Interpreter Services? Not required.    Chief Complaint   Patient presents with    Kidney Transplant Follow-up       HISTORY OF PRESENT ILLNESS:  [name] is a 43 y.o. year old male S/P kidney transplant on 05/20/2019 for follow up.   Patient feels well and is making 2-3 L of urine daily.    Past Medical History, Surgical History, Family History and Social History:  Reviewed changes since his last visit.    Current Medications       Dosage    acetaminophen (TYLENOL) 500 mg tablet Take 2 tablets (1,000 mg total) by mouth every 8 (eight) hours as needed for Pain (mild pain) Max = 3000 mg/day    albuterol 90 mcg/actuation metered dose inhaler Inhale 1-2 puffs into the lungs every 6 (six) hours as needed for Wheezing or Shortness of Breath       amLODIPine (NORVASC) 10 mg tablet Take 1 tablet (10 mg total) by mouth daily    ciprofloxacin HCl (CIPRO) 500 mg tablet Take 1 tablet (500 mg total) by mouth Twice a day Starting night before stent removal appointment    docusate sodium (COLACE) 250 mg capsule Take 1 capsule (250 mg total) by mouth Twice a day Hold for loose stools    fluconazole (DIFLUCAN) 100 mg tablet Take 1 tablet (100 mg total) by mouth every Thursday Stop after 06/18/2019    mycophenolate (CELLCEPT) 250 mg capsule Take 4 capsules (1,000 mg total) by mouth Twice a day or as directed    omeprazole (PRILOSEC) 20 mg capsule Take 1 capsule (20 mg total) by mouth daily Stop after 08/18/2019. May use as needed thereafter.    oxyCODONE (ROXICODONE) 5 mg tablet Take 1 tablet (5 mg total) by mouth every 6 (six) hours as needed (severe pain)    oxyCODONE-acetaminophen (PERCOCET) 10-325 mg tablet Take 1 tablet by mouth every 8 (eight) hours as needed for Pain (Lower back pain)        predniSONE (DELTASONE) 10 mg tablet Take /day 12/9-12/15, /day 12/16-12/22, /day 12/23-12/29.    predniSONE (DELTASONE) 5 mg tablet Take 1 tablet (5 mg total) by mouth daily Starting 06/17/19 onwards.    senna (SENOKOT) 8.6 mg tablet Take 2 tablets (17.2 mg total) by mouth nightly as needed for Constipation    sulfamethoxazole-trimethoprim (SEPTRA DS) 800-160 mg tablet Take 1 tablet by mouth daily through 06/19/2019, then 1 tablet every Monday, Wednesday, and Friday Stop after 05/19/2020    tacrolimus (PROGRAF) 1 mg capsule Take by mouth 9 mg in AM and 9 mg in PM or as directed    valGANciclovir (VALCYTE) 450 mg tablet Take 1 tablet (450 mg total) by mouth daily Stop after 08/18/2019          Allergies/Contraindications   Allergen Reactions    Aspirin Other (See Comments)     Bleeding ulcers; Has stomach bleeding         Review of Systems:   General: No fevers/chills  Pulmonary: No recent cough   Cardiac: No chest pain, palpitations, edema.  Gastrointestinal: No nausea/vomiting  Genitourinary: No gross hematuria, dysuria.  No pain over allograft.  Musculoskeletal: No arthralgias/myalgias.  Dermatologic: No rash.  Neurologic: No numbness,  Endocrine: No polyuria/polydipsia, hyperglycemia.  All other systems were reviewed and are negative.  Legally blind  Physical Exam:   Vitals:    06/15/19 1246   BP: 129/78   Pulse: 75   Resp: 18   Temp: (!) 35.9 C (96.6 F)   TempSrc: Temporal   SpO2: 100%   Weight: (!) 109.8 kg (242 lb)   Height: 182.9 cm (6')     General: not in acute distress.  HENT: Normocephalic   Eye: legally blind     Chest: Good inspiratory effort. Lungs clear to auscultation & percussion without use of accessory muscles.   Heart: Regular rate and rhythm, no rubs, murmurs or gallops. No edema.  Abdomen: Nontender/nondistended without hepatosplenomegaly or masses.   Lymphatic: No cervical or axillary adenopathy   Musculoskeletal: Normal gait  Skin: No icterus   Psychiatric: Normal mood and affect.   Neurological: Cranial nerves grossly intact. Alert and oriented x 3.  Genitourinary: Allograft is nontender.surgical staples in place, clean , no erythema, still area of suture line  slightly gaping    Laboratory and Other Data:  I personally reviewed and interpreted outside laboratory data.  Creatinine   Date Value Ref Range Status   06/15/2019 2.12 (H) 0.73 - 1.24 mg/dL Final   16/10/960412/22/2020 5.402.52 (H) 0.73 - 1.24 mg/dL Final   98/11/914712/17/2020 8.293.74 (H) 0.73 - 1.24 mg/dL Final     Creatinine, Serum / Plasma   Date Value Ref Range Status   06/08/2019 1.64 (H) 0.60 - 1.35 mg/dL Final     Urea Nitrogen, Serum / Plasma   Date Value Ref Range Status   06/15/2019 28 (H) 7 - 25 mg/dL Final   56/21/308612/22/2020 24 7 - 25 mg/dL Final   57/84/696212/21/2020 14 7 - 25 mg/dL Final     Sodium, Serum / Plasma   Date Value Ref Range Status   06/15/2019 138 135 - 145 mmol/L Final   06/09/2019 141 135 - 145 mmol/L Final   06/08/2019 141 135 - 146 mmol/L Final     Potassium, Serum / Plasma   Date Value Ref Range Status   06/15/2019 4.2 3.5 - 5.0 mmol/L Final   06/09/2019 3.3 (L) 3.5 - 5.0 mmol/L Final   06/08/2019 3.6 3.5 - 5.3 mmol/L Final     Carbon Dioxide, Total   Date Value Ref Range Status   06/15/2019 21 (L) 22 - 29 mmol/L Final   06/09/2019 27 22 - 29 mmol/L Final   06/08/2019 31 20 - 32 mmol/L Final     Calcium, total, Serum / Plasma   Date Value Ref Range Status   06/15/2019 9.4 8.4 - 10.5 mg/dL Final   95/28/413212/22/2020 9.2 8.4 - 10.5 mg/dL Final   44/01/027212/21/2020 8.9 8.6 - 10.3 mg/dL Final     Phosphorus, Serum / Plasma   Date Value Ref Range Status   06/15/2019 3.5 2.3 - 4.7 mg/dL Final   53/66/440312/22/2020 2.5 2.3 - 4.7 mg/dL Final   47/42/595612/21/2020 2.2 (L) 2.5 - 4.5 mg/dL Final     WBC Count   Date Value Ref Range Status   06/15/2019 3.6 3.4 - 10.0 x10E9/L Final   06/09/2019 5.6 3.4 - 10.0 x10E9/L Final   06/08/2019 5.0 3.8 - 10.8 Thousand/uL Final     Hemoglobin   Date Value Ref Range Status    06/15/2019 10.9 (L) 13.6 - 17.5 g/dL Final   38/75/643312/22/2020 29.510.8 (L) 13.6 - 17.5 g/dL Final   18/84/166012/21/2020 63.010.2 (L) 13.2 - 17.1 g/dL Final     Hematocrit   Date Value Ref Range Status   06/15/2019  33.6 (L) 41.0 - 53.0 % Final   06/09/2019 34.1 (L) 41.0 - 53.0 % Final   06/08/2019 31.7 (L) 38.5 - 50.0 % Final     MCV   Date Value Ref Range Status   06/15/2019 84 80 - 100 fL Final   06/09/2019 88 80 - 100 fL Final   06/08/2019 85.4 80.0 - 100.0 fL Final     Platelet Count   Date Value Ref Range Status   06/15/2019 264 140 - 450 x10E9/L Final   06/09/2019 230 140 - 450 x10E9/L Final   06/08/2019 207 140 - 400 Thousand/uL Final     Tacrolimus   Date Value Ref Range Status   06/15/2019 5.9 5.0 - 15.0 ug/L Final     Comment:     Performed using the [employer] Architect Chemiluminescent Microparticle Immunoassay (CMIA).   06/09/2019 5.2 5.0 - 15.0 ug/L Final     Comment:     Performed using the [employer] Architect Chemiluminescent Microparticle Immunoassay (CMIA).   06/04/2019 5.0 5.0 - 15.0 ug/L Final     Comment:     Performed using the [employer] Architect Chemiluminescent Microparticle Immunoassay (CMIA).       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 Acute tubular epithelia injury; see comment.   2. No evidence of rejection, negative C4d stain.    2. No significant tubulointerstitial chronicity.   3. Moderate arteriosclerosis, donor-derived.     Special Stain Summary: (x8) PAS and trichrome special stains were  performed and examined to confirm the diagnosis. The findings are  consistent with our H&E-based light microscopic impression. Six  additional PAS-stained level sections were evaluated, and no segmentally  sclerotic glomeruli are identified.     DSAs not done      Immunosuppressive management encounter following kidney transplant 05/21/2019     Induction agent/dose: Thymo lite  Regime: Tac/MMF  Corticosteroid plan: maintenance   cPRA: 10%      Transplant follow-up 05/21/2019     Category Problem Type of follow-up needed in clinic? Ordered? Scheduled? Comments   Nursing (e.g., drain/ Foley) HD tunneled line refer to IR for removal when appropriate Needs to be done in clinic weekly dressing changes in HD clinic until can be removed   Lab/ Micro Standard Tac Follow up results   Needs to be ordered None   Imaging studies/ procedures NA NA NA None   Referrals Uroglogy for stent removal NA Already ordered None   Delayed graft function and improving UOP, monitor for resolution        Ureteral stent retained of kidney transplant (CMS code) 05/21/2019     Implant Name Type Inv. Item Serial No. Manufacturer Lot No. LRB No. Used Action   STENT URETHRAL 6FR X 16CM DOUBLE J  [id number] - [id number] Stents-Non Des STENT URETHRAL 6FR X Brion Aliment   [id number]   [id number] Right 1 Implanted     Referral placed for outpatient stent removal      At risk for opportunistic infections 05/21/2019     Transplant Serology Workup:  Recipient        Lab Results   Component Value Date    HBCAB NEG 12/17/2018    HBSAG NEG 12/17/2018    HBABQ >800 12/17/2018    HCV NEG 12/17/2018    RPR Nonreactive 12/17/2018    CMVAB POS (A) 12/17/2018    VCAM NEG 12/17/2018    EBNA POS (A) 12/17/2018    TOXO NEG 12/17/2018         Donor        Cadaver Donor UNOS ID (no units)    Date Value   05/20/2019 [id number]     Match ID: [id number]          Induction Plan:   Thymo + steroid maintenance     Plan for PPX:   Toxo prophylaxis: (D +/R - Toxo MISMATCH), Septra DS 1 tab PO Daily x 1 month (06/19/2019), then qMWF x 11 months more (05/19/2020) -- 1 year total for Toxo Mismatch   PCP prophylaxis: Septra DS as per above for Toxo ppx   CMV prophylaxis: (D +/R +), Valcyte 900 mg PO Daily (adjusted for renal function) x 3 months (08/18/2019)   Fungal prophylaxis: Fluconazole 100 mg PO q7days x 1 month (06/18/2019)  HBV prophylaxis: Not necessary            At risk of infection transmitted from donor 05/21/2019     Patient underwent transplant with no known donor derived risk factors: Donor is not CDC high risk, not HBV core ab + and there are no known donor derived infections. No need for treatment or monitoring beyond the usual opportunistic infection prophylaxis per protocol. Donor with no known malignancies.  The donor's CMV status is IgG positive and the recipient's CMV status is IgG positive.  Prophylaxis will be determined by risk of CMV transmission.                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     Dialysis Type: HD Hemodialysis  Dialysis Center: [facility]         Phone: [phone]        Fax: [phone]       Address: 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  Been on HD since 2010.  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  Last  HD was 12/21.No further need for dialysis at this time.He has a tunneled dialysis catheter which needs removal. Patient lives 3 hrs away. He does not want to come to [place] for dialysis catheter removal.  I spoke to his local nephrologist Dr Janeece Riggers and he will refer him for tunneled dialysis catheter  removal locally.   Monitor labs twice weekly.    FSGS (focal segmental glomerulosclerosis)  Etiology of ESRD was biopsy-proven FSGS. Urine protein to creatinine ratio 253 mg/g creat.Monitor upc.    Immunosuppressive management encounter following kidney transplant  cPRA 10%. He received thymolite for induction  Increase tacrolimus to 9 mg po BID with goal trough 8-10 ng/ml.Continue MMF 1g po BID  Continue prednisone taper to maintenance.  Importance of 12 hr trough level explained to the patient.    At risk for opportunistic infections  Continue fluconazole weekly until 12/31  Continue Septra daily until 06/19/2019, followed by MWF until 05/19/2020  Continue valcyte ( renally dosed )  until 08/18/2019.    Ureteral stent retained of kidney transplant (CMS code)  He has a ureteral stent and has urology follow up for stent removal.    HTN (hypertension)  BP controlled on amlodipine 10mg  po daily.      Health Care Maintenance:   Discussed need for cancer surveillance   Discussed exercise   Discussed need for sunscreen and annual dermatology visit.     Return to clinic in 1week.        , MD  Associate Clinical Professor of Medicine  Kidney Transplant Service  [place] [facility] [place]    06/15/2019  1:00 PM

## 2019-06-16 LAB — ABO/RH: ABO/RH(D): AB NEG

## 2019-06-16 NOTE — Assessment & Plan Note (Signed)
Continue fluconazole weekly until 12/31  Continue Septra daily until 06/19/2019, followed by MWF until 05/19/2020  Continue valcyte ( renally dosed )  until 08/18/2019.

## 2019-06-16 NOTE — Assessment & Plan Note (Signed)
Etiology of ESRD was biopsy-proven FSGS. Urine protein to creatinine ratio 253 mg/g creat.Monitor upc.

## 2019-06-16 NOTE — Assessment & Plan Note (Signed)
He has a ureteral stent and has urology follow up for stent removal.

## 2019-06-16 NOTE — Assessment & Plan Note (Signed)
cPRA 10%. He received thymolite for induction  Increase tacrolimus to 9 mg po BID with goal trough 8-10 ng/ml.Continue MMF 1g po BID  Continue prednisone taper to maintenance.  Importance of 12 hr trough level explained to the patient.

## 2019-06-16 NOTE — Assessment & Plan Note (Signed)
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  Last HD was 12/21.No further need for dialysis at this time.He has a tunneled dialysis catheter which needs removal. Patient lives 3 hrs away. He does not want to come to [place] for dialysis catheter removal.  I spoke to his local nephrologist Dr Kasandra Knudsen and he will refer him for tunneled dialysis catheter  removal locally.   Monitor labs twice weekly.

## 2019-06-16 NOTE — Assessment & Plan Note (Signed)
BP controlled on amlodipine 10mg  po daily.

## 2019-06-25 NOTE — Progress Notes (Signed)
RNC following up on msg from last week. Request from Irena Cords at [facility] dialysis center ph: [phone], fax [phone] to provide documentation of pt no longer needing dialysis. Spoke with Amy, staff at center to obtain fax number. Will provide most recent Progress Note indicating no longer needs dialysis.  Sent Dr. Daryll Drown note 06/15/19 via eFax through [employer].

## 2019-06-29 ENCOUNTER — Ambulatory Visit: Admit: 2019-06-29 | Payer: MEDICARE | Primary: Physician

## 2019-06-29 ENCOUNTER — Ambulatory Visit: Admit: 2019-06-29 | Discharge: 2019-06-29 | Payer: MEDICARE | Attending: Nephrology | Primary: Physician

## 2019-06-29 DIAGNOSIS — Z9189 Other specified personal risk factors, not elsewhere classified: Secondary | ICD-10-CM

## 2019-06-29 DIAGNOSIS — Z79899 Other long term (current) drug therapy: Secondary | ICD-10-CM

## 2019-06-29 DIAGNOSIS — T8619 Other complication of kidney transplant: Secondary | ICD-10-CM

## 2019-06-29 DIAGNOSIS — N2581 Secondary hyperparathyroidism of renal origin: Secondary | ICD-10-CM

## 2019-06-29 DIAGNOSIS — T8389XA Other specified complication of genitourinary prosthetic devices, implants and grafts, initial encounter: Secondary | ICD-10-CM

## 2019-06-29 DIAGNOSIS — N051 Unspecified nephritic syndrome with focal and segmental glomerular lesions: Secondary | ICD-10-CM

## 2019-06-29 DIAGNOSIS — Z940 Kidney transplant status: Secondary | ICD-10-CM

## 2019-06-29 DIAGNOSIS — Z789 Other specified health status: Secondary | ICD-10-CM

## 2019-06-29 DIAGNOSIS — E559 Vitamin D deficiency, unspecified: Secondary | ICD-10-CM

## 2019-06-29 DIAGNOSIS — N269 Renal sclerosis, unspecified: Secondary | ICD-10-CM

## 2019-06-29 LAB — CREATININE, RANDOM URINE: Creatinine, Random Urine: 315.46 mg/dL

## 2019-06-29 LAB — PROTEIN, TOTAL, RANDOM URINE
Prot Concentration,UR: 96 mg/dL
Protein/Creat Ratio, Urine: 304 mg/g crea — ABNORMAL HIGH (ref <150)

## 2019-06-29 NOTE — Progress Notes (Signed)
Patient has consent to participate in the OKRA study.   AlloSure was ordered on 06/29/19 as per protocol for routine monitoring. The Allosure result was 0.34%. Based on the AlloSure result and other clinical information, the patient was informed of the following management plan: routine clinic follow up and labs per guideline

## 2019-06-29 NOTE — Progress Notes (Signed)
POST-KIDNEY TRANSPLANT NEPHROLOGY PROGRESS NOTE         Subjective      Chief Complaint   Patient presents with    Kidney Transplant Follow-up       History of Present Illness:  [name] is a 44 y.o. year old male S/P kidney transplant in 05/20/19, being seen for a follow-up visit. The patient feels well and has no new complaints.     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 Has chronic back pain, on chronic opioid for pain control.        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 Acute tubular epithelia injury; see comment.   2. No evidence of rejection, negative C4d stain.   2. No significant tubulointerstitial chronicity.   3. Moderate arteriosclerosis, donor-derived.     Special Stain Summary: (x8) PAS and trichrome special stains were  performed and examined to confirm the diagnosis. The findings are  consistent with our H&E-based light microscopic impression. Six  additional PAS-stained level sections were evaluated, and no segmentally  sclerotic glomeruli are identified.     DSAs not done      Immunosuppressive management encounter following kidney transplant 05/21/2019     Induction agent/dose: Thymo lite  Regime: Tac/MMF  Corticosteroid plan: maintenance   cPRA: 10%      Transplant follow-up 05/21/2019      Category Problem Type of follow-up needed in clinic? Ordered? Scheduled? Comments   Nursing (e.g., drain/ Foley) HD tunneled line refer to IR for removal when appropriate Needs to be done in clinic weekly dressing changes in HD clinic until can be removed   Lab/ Micro Standard Tac Follow up results   Needs to be ordered None   Imaging studies/ procedures NA NA NA None   Referrals Uroglogy for stent removal NA Already ordered None   Delayed graft function and improving UOP, monitor for resolution        Ureteral stent retained of kidney transplant (CMS code) 05/21/2019     Implant Name Type Inv. Item Serial No. Manufacturer Lot No. LRB No. Used Action   STENT URETHRAL 6FR X 16CM DOUBLE J   [id number] - [id number] Stents-Non Des STENT URETHRAL 6FR X 16CM DOUBLE J   [id number]   [id number] Right 1 Implanted     Referral placed for outpatient stent removal    07/01/19 stent removed.       At risk for opportunistic infections 05/21/2019     Transplant Serology Workup:  Recipient        Lab Results   Component Value Date    HBCAB NEG 12/17/2018    HBSAG NEG 12/17/2018    HBABQ >800 12/17/2018    HCV NEG 12/17/2018    RPR  Nonreactive 12/17/2018    CMVAB POS (A) 12/17/2018    VCAM NEG 12/17/2018    EBNA POS (A) 12/17/2018    TOXO NEG 12/17/2018         Donor        Cadaver Donor UNOS ID (no units)   Date Value   05/20/2019 [id number]     Match ID: [id number]          Induction Plan:   Thymo + steroid maintenance     Plan for PPX:   Toxo prophylaxis: (D +/R - Toxo MISMATCH), Septra DS 1 tab PO Daily x 1 month (06/19/2019), then qMWF x 11 months more (05/19/2020) -- 1 year total for Toxo Mismatch   PCP prophylaxis: Septra DS as per above for Toxo ppx   CMV prophylaxis: (D +/R +), Valcyte 900 mg PO Daily (adjusted for renal function) x 3 months (08/18/2019)   Fungal prophylaxis: Fluconazole 100 mg PO q7days x 1 month (06/18/2019)  HBV prophylaxis: Not necessary            At risk of infection transmitted from donor 05/21/2019      Patient underwent transplant with no known donor derived risk factors: Donor is not CDC high risk, not HBV core ab + and there are no known donor derived infections. No need for treatment or monitoring beyond the usual opportunistic infection prophylaxis per protocol. Donor with no known malignancies.  The donor's CMV status is IgG positive and the recipient's CMV status is IgG positive.  Prophylaxis will be determined by risk of CMV transmission.                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     Dialysis Type: HD Hemodialysis  Dialysis Center: [facility]         Phone: [phone]        Fax: [phone]       Address: 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  Been on HD since 2010.  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   Medication Sig    amLODIPine (NORVASC) 10 mg tablet Take 1 tablet (10 mg total) by mouth daily    docusate sodium (COLACE) 250 mg capsule Take 1 capsule (250 mg total) by mouth Twice a day Hold for loose stools    mycophenolate (CELLCEPT) 250 mg capsule Take 4 capsules (1,000 mg total) by mouth Twice a day or as directed    omeprazole (PRILOSEC) 20 mg capsule Take 1 capsule (20 mg total) by mouth daily Stop after 08/18/2019. May use as needed thereafter.     oxyCODONE (ROXICODONE) 5 mg tablet Take 1 tablet (5 mg total) by  mouth every 6 (six) hours as needed (severe pain) (Patient taking differently: Take 10 mg by mouth every 6 (six) hours as needed (severe pain)   )    predniSONE (DELTASONE) 5 mg tablet Take 1 tablet (5 mg total) by mouth daily Starting 06/17/19 onwards.    senna (SENOKOT) 8.6 mg tablet Take 2 tablets (17.2 mg total) by mouth nightly as needed for Constipation    sulfamethoxazole-trimethoprim (SEPTRA DS) 800-160 mg tablet Take 1 tablet by mouth daily through 06/19/2019, then 1 tablet every Monday, Wednesday, and Friday Stop after 05/19/2020    tacrolimus (PROGRAF) 1 mg capsule Take by mouth 9 mg in AM and 9 mg in PM or as directed    valGANciclovir (VALCYTE) 450 mg tablet Take 1 tablet (450 mg total) by mouth daily Stop after 08/18/2019         Current Medications       Dosage    acetaminophen (TYLENOL) 500 mg tablet Take 2 tablets (1,000 mg total) by mouth every 8 (eight) hours as needed for Pain (mild pain) Max = 3000 mg/day    albuterol 90 mcg/actuation metered dose inhaler Inhale 1-2 puffs into the lungs every 6 (six) hours as needed for Wheezing or Shortness of Breath       amLODIPine (NORVASC) 10 mg tablet Take 1 tablet (10 mg total) by mouth daily    ciprofloxacin HCl (CIPRO) 500 mg tablet Take 1 tablet (500 mg total) by mouth Twice a day Starting night before stent removal appointment    docusate sodium (COLACE) 250 mg capsule Take 1 capsule (250 mg total) by mouth Twice a day Hold for loose stools    fluconazole (DIFLUCAN) 100 mg tablet Take 1 tablet (100 mg total) by mouth every Thursday Stop after 06/18/2019    mycophenolate (CELLCEPT) 250 mg capsule Take 4 capsules (1,000 mg total) by mouth Twice a day or as directed    omeprazole (PRILOSEC) 20 mg capsule Take 1 capsule (20 mg total) by mouth daily Stop after 08/18/2019. May use as needed thereafter.     oxyCODONE (ROXICODONE) 5 mg tablet Take 1 tablet (5 mg total) by mouth every 6 (six) hours as needed (severe pain)    oxyCODONE-acetaminophen (PERCOCET) 10-325 mg tablet Take 1 tablet by mouth every 8 (eight) hours as needed for Pain (Lower back pain)       predniSONE (DELTASONE) 5 mg tablet Take 1 tablet (5 mg total) by mouth daily Starting 06/17/19 onwards.    senna (SENOKOT) 8.6 mg tablet Take 2 tablets (17.2 mg total) by mouth nightly as needed for Constipation    sulfamethoxazole-trimethoprim (SEPTRA DS) 800-160 mg tablet Take 1 tablet by mouth daily through 06/19/2019, then 1 tablet every Monday, Wednesday, and Friday Stop after 05/19/2020    tacrolimus (PROGRAF) 1 mg capsule Take by mouth 9 mg in AM and 9 mg in PM or as directed    valGANciclovir (VALCYTE) 450 mg tablet Take 1 tablet (450 mg total) by mouth daily Stop after 08/18/2019                Objective      Vitals      Most Recent Value   BP  120/70   Heart Rate  74   *Resp  18   Temp  36.1 C (96.9 F)   Temp Source  Temporal   SpO2  99 %   Weight  (!) 109.8 kg (242 lb)   Height  182.9 cm (6')  Pain Score  0   BMI (Calculated)  32.9            Physical Exam:   General: Chronically ill-appearing. No acute distress.  HEENT: Mucous membranes moist.   Chest: Good inspiratory effort. Lungs clear to auscultation.   Heart:: Regular rhythm. Murmur present. Pedal edema absent.  Abdomen: Nontender/nondistended.   Lymphatic: No cervical or axillary adenopathy.   Skin: No obvious rashes.  Psychiatric: Normal mood and affect.   Neurological: Alert and oriented x 3. Tremor absent.   Genitourinary: Allograft is not tender.       Review of Prior Testing  I personally reviewed data from 06/26/19 as noted below including the following:    1. Metabolic panel  2. Complete blood count  3. Urine chemistry including level of proteinuria  4. Level of immunosuppressive drug (tacrolimus).          Lab Results   Component Value Date    Creatinine 2.12 (H) 06/15/2019     Creatinine 2.52 (H) 06/09/2019    Creatinine 3.74 (H) 06/04/2019    Creatinine, Serum / Plasma 2.17 (H) 06/26/2019    Creatinine, Serum / Plasma 1.64 (H) 06/08/2019    Urea Nitrogen, Serum / Plasma 22 06/26/2019    Urea Nitrogen, Serum / Plasma 28 (H) 06/15/2019    Urea Nitrogen, Serum / Plasma 24 06/09/2019    Sodium, Serum / Plasma 137 06/26/2019    Sodium, Serum / Plasma 138 06/15/2019    Sodium, Serum / Plasma 141 06/09/2019    Potassium, Serum / Plasma 4.4 06/26/2019    Potassium, Serum / Plasma 4.2 06/15/2019    Potassium, Serum / Plasma 3.3 (L) 06/09/2019    Carbon Dioxide, Total 23 06/26/2019    Carbon Dioxide, Total 21 (L) 06/15/2019    Carbon Dioxide, Total 27 06/09/2019    Calcium, total, Serum / Plasma 9.8 06/26/2019    Calcium, total, Serum / Plasma 9.4 06/15/2019    Calcium, total, Serum / Plasma 9.2 06/09/2019    Phosphorus, Serum / Plasma 3.5 06/26/2019    Phosphorus, Serum / Plasma 3.5 06/15/2019    Phosphorus, Serum / Plasma 2.5 06/09/2019     Lab Results   Component Value Date    WBC Count 3.0 (L) 06/26/2019    WBC Count 3.6 06/15/2019    WBC Count 5.6 06/09/2019    Hemoglobin 11.2 (L) 06/26/2019    Hemoglobin 10.9 (L) 06/15/2019    Hemoglobin 10.8 (L) 06/09/2019    Hematocrit 34.1 (L) 06/26/2019    Hematocrit 33.6 (L) 06/15/2019    Hematocrit 34.1 (L) 06/09/2019    MCV 83.0 06/26/2019    MCV 84 06/15/2019    MCV 88 06/09/2019    Platelet Count 303 06/26/2019    Platelet Count 264 06/15/2019    Platelet Count 230 06/09/2019       Lab Results   Component Value Date    Tacrolimus 8.8 06/26/2019    Tacrolimus 5.9 06/15/2019    Tacrolimus 5.2 06/09/2019    Tacrolimus 3.2 (L) 06/08/2019    Tacrolimus 5.0 06/04/2019    Tacrolimus 9.0 06/01/2019          Assessment and Plan    Deceased-donor kidney transplant recipient   Forty days post transplant. The serum creatinine was 2.17 mg/dL on 06/26/19, urine protein/creatinine ratio 304 mg/g, the SCr is within the baseline range of SCr 2.1 - 2.5 mg/dL. The allograft function is stable. Allosure result 0.34% today (06/29/19), no suspicion for immunological  event.  Status: chronic and stable.      Immunosuppressive management encounter following kidney transplant  Current immunosuppression regimen  Tacrolimus 9 mg BID  MMF 1000 mg BID  Prednisone 5 mg QD    The tacrolimus level was 8.8 ng/mL on 06/26/19. No changes to tac dose based on this level. Will continue to check labs once a week. Target trough FK level 8-10 ng/mL.    Status: chronic and stable      FSGS (focal segmental glomerulosclerosis)  Patient had early post transplant biopsy (POD5) and no evidence of FSGS recurrence. The urine protein/creatinine ratio was 304 mg/g. Will continue to monitor the UPC.      Ureteral stent retained of kidney transplant (CMS code)  Stent removed 07/01/19.     At risk for opportunistic infections  Needs Toxo prophylaxis with Septra DS MWF x 12 months, until 05/19/20.   eGFR 40 mL/min, continue Valganciclovir 450 mg daily until 08/18/19.        Visit Diagnoses and Associated Orders Placed:  1. Deceased-donor kidney transplant recipient  Basic Metabolic Panel - [place] (NA, K, CL, CO2, BUN, CR, GLU, CA)    Comprehensive Metabolic Panel - [place]/[facility] (BMP, AST, ALT, T.BILI, ALKP, TP, ALB)    Complete Blood Count with Differential    Protein, Total, random urine    Tacrolimus Level, Immunoassay    Cholesterol, LDL (incl.Tot. and HDL chol.,trig.)    BK virus, DNA, Quantitative, plasma    Phosphorus, Serum / Plasma    Magnesium, Serum / Plasma   2. Vitamin D deficiency  Vitamin D, 25-Hydroxy   3. Secondary hyperparathyroidism (CMS code)  Parathyroid Hormone with Calcium   4. Immunosuppressive management encounter following kidney transplant     5. FSGS (focal segmental glomerulosclerosis)      6. Ureteral stent retained of kidney transplant (CMS code)     7. At risk for opportunistic infections        Orders Placed This Encounter    Basic Metabolic Panel - [place]/[facility] (NA, K, CL, CO2, BUN, CR, GLU, CA)     Standing Status:   Standing     Number of Occurrences:   50     Standing Expiration Date:   06/28/2020     Order Specific Question:   Container details     Answer:   Light green top preferred, gold top acceptable    Comprehensive Metabolic Panel - [place]/[facility] (BMP, AST, ALT, T.BILI, ALKP, TP, ALB)     Standing Status:   Standing     Number of Occurrences:   12     Standing Expiration Date:   06/28/2020     Order Specific Question:   Container details     Answer:   Light green top preferred, gold top acceptable    Complete Blood Count with Differential     Standing Status:   Standing     Number of Occurrences:   50     Standing Expiration Date:   06/28/2020     Order Specific Question:   Container details     Answer:   Lavender top    Protein, Total, random urine     Standing Status:   Standing     Number of Occurrences:   12     Standing Expiration Date:   06/28/2020     Order Specific Question:   Container details     Answer:   Urine cup    Tacrolimus Level, Immunoassay  Standing Status:   Standing     Number of Occurrences:   50     Standing Expiration Date:   06/28/2020     Order Specific Question:   Type     Answer:   Trough     Order Specific Question:   Container details     Answer:   Lavender top    Cholesterol, LDL (incl.Tot. and HDL chol.,trig.)     Standing Status:   Standing     Number of Occurrences:   4     Standing Expiration Date:   06/28/2020     Order Specific Question:   Container details     Answer:   Girtha Rm top or Julianne Rice top     Order Specific Question:   Fasting     Answer:   No    BK virus, DNA, Quantitative, plasma     Standing Status:   Standing     Number of Occurrences:   12     Standing Expiration Date:   06/28/2020      Order Specific Question:   Container details     Answer:   Lavender top (6 mL)    Phosphorus, Serum / Plasma     Standing Status:   Standing     Number of Occurrences:   50     Standing Expiration Date:   06/28/2020     Order Specific Question:   Container details     Answer:   Light green top preferred, Gold top acceptable    Magnesium, Serum / Plasma     Standing Status:   Standing     Number of Occurrences:   50     Standing Expiration Date:   06/28/2020     Order Specific Question:   Container details     Answer:   Light green top preferred, Gold top acceptable    Parathyroid Hormone with Calcium     Standing Status:   Standing     Number of Occurrences:   4     Standing Expiration Date:   06/28/2020     Order Specific Question:   Container details     Answer:   Julianne Rice top preferred, Lavender top or Gold top acceptable    Vitamin D, 25-Hydroxy     Standing Status:   Standing     Number of Occurrences:   4     Standing Expiration Date:   06/28/2020     Order Specific Question:   Container details     Answer:   Gold top       Nutritional assessment: Patient appears well developed and well-nourished. No need for a dietary consult at this time   Pharmacy assessment: Current medications reviewed and discussed with patient. No need for a pharmacy consult at this time.    Continue to monitor kidney function, CBC, chem-7, drug levels .   Monitor liver function, lipid panel, hemoglobin A1C, intact PTH, urine pr/cr ratio every 3-6 months.       Health Care Maintenance:   Recommend usual age- and sex-appropriate cancer screening plus annual dermatology visit for skin cancer surveillance   Recommend age and disease-appropriate vaccinations (killed vaccines only) including annual influenza vaccine. Live vaccines are contraindicated.      Risk Assessment: The patient is at increased risk of complications including drug toxicity, infection and malignancy as a result of immunosuppression for his kidney transplant and requires close monitoring of immunosuppression levels and dose  adjustment to manage toxicities.   There is no evidence of immunosuppressive drug toxicity at this visit.                  I spent a total of 40 minutes on this patient's care on the day of their visit excluding time spent related to any billed procedures. This time includes face-to-face time with the patient as well as time spent documenting in the medical record, reviewing patient's records and tests, obtaining history, placing orders, communicating with other healthcare professionals, counseling the patient, family, or caregiver, and/or care coordination for the diagnoses above.

## 2019-06-30 NOTE — Telephone Encounter (Signed)
Called patient to complete COVID screening and review "[employer]" prior to their appointment on 07/01/19.    SYMPTOM REVIEW (Please check the patient's response):    Question No Yes   1. Have you had a fever in the past 24 hours?  (? 37.8C/100F) _0  _1    2.   Have you had any of these symptoms in the past 24 hours?   Fever, Chills, Shivering/ Shakes   Cough   Sore Throat   Runny or congested nose, Difficulty breathing or SOB   Unexplained muscle aches   Feeling unusually weak or fatigued   Loss of taste or smell   Diarrhea   Eye redness with or without drainage (pink eye)   (Unusual = not explained by pre-existing condition) _2  _3    3.   Do you feel like you are coming down with a sickness/ virus? _4  _5      ENVIRONMENTAL RISK REVIEW (Please check the patient's response):    Question No Yes   1. Are you travelling from outside of the [place] to [place] for your care? _6  _7    2. Have you returned from travel outside of [place] in the past 14 days? _8  _9    3.   In the past 30 days, have you been diagnosed (per a MD) with COVID-19? _10  _11    4. Do you live with someone who will be waiting for COVID test results due to symptoms of a COVID-type illness? _12  _13    5.   In the past 14 days, have you had unprotected close contact with someone diagnosed with COVID-19 (such as household contact)? _14  _15    6.   Do you live in an institutional home setting?   Nursing Home   Dormitory   Shelter _16  _17    7.   Are you living with someone who has a weak immune system? _18  _19    8.   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 Age > 34 years old? _22  _23    2.   Immunocompromised?   Oncology   Transplant   Immunosuppressive meds   HIV   Other immunodeficiency _24  _25    3.   Pregnancy? _26  _27    4.   Chronic Lung disease? _28  _29    5.   Cirrhosis? _30  _31    6.   Cardiovascular disease? _32  _33    7.   End stage renal  disease? _34  _35    8.   Diabetes? _36  _37    9.   Hypertension? _38  _39    10. Smoking/ vaping _40  _41        PLAN (Please check appropriate box):  _42  Patient OK to present to clinic for visit    - Confirmed with patient OK to present to clinic on 07/01/19.   - Remind patient to arrive to clinic no earlier than 10 minutes prior to appointment time  _43  Follow up with Nurse Manager and Provider to perform same/next day video triage and/or cancel/reschedule in 14 days         [employer] reviewed with patient:    No visitors are permitted except in the following circumstances where (1) caregiver is required to accompany patient to his/her visit and/or treatment:     Family members of patients who are in [place].    Visits by anyone who is legally authorized to make decisions  for the patient, whether by operation of a DPOA or conservatorship, or a surrogate decision-maker as recognized by the provider team for patients who lack decisional capacity.   Support persons for patients with developmental disabilities, physical disabilities/limitations or cognitive impairment who require assistance.   Support persons for patients who have delirium and/or dementia where the presence of the visitor is necessary to reduce the patient's delirium, reduce the risk of falls or other injury, and otherwise reduce the risk of medical or clinical harm.    Please check appropriate box:  [x]   Patient verbalized understanding of the [employer] Policy  []   Patient confirms they will present to clinic alone  [x]   Patient confirms they will present to clinic with an Essential Visitor/Caretaker    **If Essential Visitor/Caretaker will present to clinic, please complete screening for the visitor**    Patient will be accompanied by essential visitor/ caretaker (include first & last name/ relation to patient) Cody Myers, son.  COVID screening for essential visitor/ caretaker completed below:    SYMPTOM REVIEW (Please  check the essential visitor/ caretaker response):    Question No Yes   2. Have you had a fever in the past 24 hours?  (? 37.8C/ 100F) [x]  []    2.   Have you had any of these symptoms in the past 24 hours?   Fever, Chills, Shivering/ Shakes   Cough   Sore Throat   Runny or congested nose, Difficulty breathing or SOB   Unexplained muscle aches   Feeling unusually weak or fatigued   Loss of taste or smell   Diarrhea   Eye redness with or without drainage (pink eye)   (Unusual = not explained by pre-existing condition) [x]  []    3.   Do you feel like you are coming down with a sickness/ virus? [x]  []      ESSENTIAL VISITOR/ CARETAKER ENVIRONMENTAL RISK REVIEW (Please check the essential visitor/ caretaker response):    Question No Yes   3. Are you travelling from outside of the [place] to [place] for your care? []  [x]    4. Have you returned from travel outside of in the past 14 days? [x]  []    3.   In the past 30 days, have you been diagnosed (per a MD) with COVID-19? [x]  []    4. Do you live with someone who will be waiting for COVID test results due to symptoms of a COVID-type illness? [x]  []    5.   In the past 14 days, have you had unprotected close contact with someone diagnosed with COVID-19 (such as household contact)? [x]  []    6.   Do you live in an institutional home setting?   Nursing Home   Dormitory   Shelter [x]  []    7.   Are you living with someone who has a weak immune system? []  [x]    8.   Are you a healthcare worker? [x]  []        ESSENTIAL VISITOR/ CARETAKER RELATED RISK REVIEW (Please check the essential visitor/ caretaker response):    Question No Yes   2. Age > 72 years old? [x]  []    2.   Immunocompromised?   Oncology   Transplant   Immunosuppressive meds   HIV   Other immunodeficiency [x]  []    3.   Pregnancy? [x]  []    4.   Chronic Lung disease? [x]  []    5.   Cirrhosis? [x]  []    6.   Cardiovascular disease? [x]  []   7.   End stage renal disease? [x]  []    8.   Diabetes? [x]  []     9.   Hypertension? [x]  []    10. Smoking/ vaping [x]  []        PLAN (Please check appropriate box):  [x]  Essential visitor/ caretaker OK to present with patient to clinic for visit    - Confirmed with essential visitor/ caretaker OK to present to clinic on 07/01/19.   - Remind essential visitor/ caretaker to arrive with patient to clinic no earlier than 10 minutes prior to appointment time  []  Follow up with Nurse Manager and Provider to perform same/next day video triage and/or cancel/reschedule in 14 days

## 2019-07-01 ENCOUNTER — Ambulatory Visit: Admit: 2019-07-01 | Discharge: 2019-07-08 | Payer: MEDICARE | Attending: Physician | Primary: Physician

## 2019-07-01 DIAGNOSIS — Z466 Encounter for fitting and adjustment of urinary device: Secondary | ICD-10-CM

## 2019-07-01 DIAGNOSIS — Z940 Kidney transplant status: Secondary | ICD-10-CM

## 2019-07-01 DIAGNOSIS — T8619 Other complication of kidney transplant: Secondary | ICD-10-CM

## 2019-07-01 LAB — COMPLETE BLOOD COUNT WITH DIFF
% Basophils: 0.7 %
% Eosinophils: 0.3 %
% Monocytes: 11.1 %
% Neutrophils: 83.2 %
Abs Basophils: 21 cells/uL (ref 0–200)
Abs Eosinophils: 9 cells/uL — ABNORMAL LOW (ref 15–500)
Abs Lymphocytes: 141 cells/uL — ABNORMAL LOW (ref 850–3900)
Abs Monocytes: 333 cells/uL (ref 200–950)
Abs Neutrophils: 2496 cells/uL (ref 1500–7800)
Hematocrit: 34.1 % — ABNORMAL LOW (ref 38.5–50.0)
Hemoglobin: 11.2 g/dL — ABNORMAL LOW (ref 13.2–17.1)
Lymphs: 4.7 %
MCH: 27.3 pg (ref 27.0–33.0)
MCHC: 32.8 g/dL (ref 32.0–36.0)
MCV: 83 fL (ref 80.0–100.0)
MPV: 10.3 fL (ref 7.5–12.5)
Platelet Count: 303 10*3/uL (ref 140–400)
RBC Count: 4.11 10*6/uL — ABNORMAL LOW (ref 4.20–5.80)
RDW: 12.9 % (ref 11.0–15.0)
WBC Count: 3 10*3/uL — ABNORMAL LOW (ref 3.8–10.8)

## 2019-07-01 LAB — BASIC METABOLIC PANEL (NA, K,
BUN/Creatinine Ratio: 10 (calc) (ref 6–22)
Calcium, total, Serum / Plasma: 9.8 mg/dL (ref 8.6–10.3)
Carbon Dioxide, Total: 23 mmol/L (ref 20–32)
Chloride, Serum / Plasma: 103 mmol/L (ref 98–110)
Creatinine, Serum / Plasma: 2.17 mg/dL — ABNORMAL HIGH (ref 0.60–1.35)
Glucose: 88 mg/dL (ref 65–99)
Potassium, Serum / Plasma: 4.4 mmol/L (ref 3.5–5.3)
Sodium, Serum / Plasma: 137 mmol/L (ref 135–146)
Urea Nitrogen, Serum / Plasma: 22 mg/dL (ref 7–25)
eGFR - high estimate: 42 mL/min/{1.73_m2} — ABNORMAL LOW (ref >=60)
eGFR - low estimate: 36 mL/min/{1.73_m2} — ABNORMAL LOW (ref >=60)

## 2019-07-01 LAB — PROTEIN, TOTAL, RANDOM URINE
Creatinine, Random Urine: 289 mg/dL (ref 20–320)
Prot Concentration,UR: 106 mg/dL — ABNORMAL HIGH (ref 5–25)
Protein/Creat Ratio, Urine: 367 mg/g creat — ABNORMAL HIGH (ref 22–128)
Protein/Creatinine Ratio, Rand: 0.367 mg/mg creat — ABNORMAL HIGH (ref 0.022–0.128)

## 2019-07-01 LAB — PHOSPHORUS, SERUM / PLASMA: Phosphorus, Serum / Plasma: 3.5 mg/dL (ref 2.5–4.5)

## 2019-07-01 LAB — BK VIRUS, DNA, QUANTITATIVE, P: BK Virus DNA: NOT DETECTED copies/mL

## 2019-07-01 LAB — TACROLIMUS, HIGHLY SENSITIVE,: Tacrolimus: 8.8 mcg/L

## 2019-07-01 LAB — MAGNESIUM, SERUM / PLASMA: Magnesium, Serum / Plasma: 1.5 mg/dL (ref 1.5–2.5)

## 2019-07-01 MED ORDER — SODIUM CHLORIDE 0.9 % BLADDER IRRIGATION SOLUTION: 0.9 % | Status: AC

## 2019-07-01 MED ORDER — LIDOCAINE 2 % MUCOSAL JELLY IN APPLICATOR
2 | Status: AC
Start: 2019-07-01 — End: ?

## 2019-07-01 MED ORDER — LIDOCAINE 2 % MUCOSAL JELLY IN APPLICATOR: 2 % | Status: AC

## 2019-07-01 MED FILL — LIDOCAINE 2 % MUCOSAL JELLY IN APPLICATOR: 2 % | Qty: 10

## 2019-07-01 NOTE — Progress Notes (Signed)
Surgeon:Nickoles Gregori Lysle Pearl, MD    Preop DX:s/p kidney transplant  Postop DX: same    Anesthesia: 2% lidocaine gel per urethra    Indications:  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 The external genitalia was prepped and draped in the usual sterile fashion. 2% lidocaine gel was injected per urethra for local anesthesia.     A well-lubricated flexible cystoscope was carefully placed into theurethra. There were nomucosal lesions or stones within the bladder. There was 1+trabeculations. Both ureteral orifices were normal in size, shape, and position with efflux of clear urine. Transplant UO had distal ureteral stent curl that was grasped and removed per urethra.       Findings: Normal cystoscopic examination.Removal of transplant JJ ureteral stent.    Complications: None    EBL minimal    Assessment & Plan:  43y man s/p kidney transplant now with ureteral stent removal today.    Patient was already on bactrim antibiotic for peri-procedural prophylaxis.

## 2019-07-08 NOTE — Assessment & Plan Note (Signed)
Patient had early post transplant biopsy (POD5) and no evidence of FSGS recurrence. The urine protein/creatinine ratio was 304 mg/g. Will continue to monitor the UPC.

## 2019-07-08 NOTE — Assessment & Plan Note (Signed)
Needs Toxo prophylaxis with Septra DS MWF x 12 months, until 05/19/20.   eGFR 40 mL/min, continue Valganciclovir 450 mg daily until 08/18/19.

## 2019-07-08 NOTE — Assessment & Plan Note (Signed)
Current immunosuppression regimen  Tacrolimus 9 mg BID  MMF 1000 mg BID  Prednisone 5 mg QD    The tacrolimus level was 8.8 ng/mL on 06/26/19. No changes to tac dose based on this level. Will continue to check labs once a week. Target trough FK level 8-10 ng/mL.    Status: chronic and stable

## 2019-07-08 NOTE — Assessment & Plan Note (Signed)
Stent removed 07/01/19.

## 2019-07-08 NOTE — Assessment & Plan Note (Signed)
Forty days post transplant. The serum creatinine was 2.17 mg/dL on 11/16/45, urine protein/creatinine ratio 304 mg/g, the SCr is within the baseline range of SCr 2.1 - 2.5 mg/dL. The allograft function is stable. Allosure result 0.34% today (06/29/19), no suspicion for immunological event.  Status: chronic and stable.

## 2019-07-11 NOTE — Assessment & Plan Note (Addendum)
cPRA 10%. He received thymolite for induction  Increase tacrolimus to 9 mg po BID with goal trough 8-10 ng/ml.Reduce  MMF to 750 mg po bid  for leucopenia.  Continue prednisone 5 mg daily.  Last FK level on 07/10/2019 was 7.5 ng/ml was 13.5 hr level  Discussed importance of 12 hr tacrolimus level.

## 2019-07-11 NOTE — Assessment & Plan Note (Addendum)
Ureteral stent was removed 07/01/19. He has no hematuria and good urine output.

## 2019-07-11 NOTE — Assessment & Plan Note (Addendum)
WBC 2.6 Thousand/uL Reduce MMF to 750 mg twice a day. Send CMV PCR.

## 2019-07-11 NOTE — Assessment & Plan Note (Signed)
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  Last HD was 06/08/2019.  Repeat comprehensive panel.  If still uptrending creatinine then will need renal allograft biopsy and DSA.

## 2019-07-11 NOTE — Assessment & Plan Note (Addendum)
Etiology of ESRD was biopsy-proven FSGS. Urine protein to creatinine ratio 298 mg/g creat on 07/10/2019 Transplant kidney, biopsy (5 days post-transplant):   1. Acute tubular epithelia injury; see comment.   2. No evidence of rejection, negative C4d stain.   2. No significant tubulointerstitial chronicity.   3. Moderate arteriosclerosis, donor-derived  No FSGS seen.

## 2019-07-11 NOTE — Assessment & Plan Note (Addendum)
Continue Septra MWF until 05/19/2020 for toxo prophylaxis.  Continue valcyte ( renally dosed )  until 08/18/2019.

## 2019-07-11 NOTE — Assessment & Plan Note (Addendum)
BP controlled on amlodipine 10mg  po daily.  Dietary changes advised.

## 2019-07-13 LAB — BASIC METABOLIC PANEL (NA, K,
BUN/Creatinine Ratio: 15 (calc) (ref 6–22)
Calcium, total, Serum / Plasma: 9.8 mg/dL (ref 8.6–10.3)
Carbon Dioxide, Total: 23 mmol/L (ref 20–32)
Chloride, Serum / Plasma: 108 mmol/L (ref 98–110)
Creatinine, Serum / Plasma: 2.58 mg/dL — ABNORMAL HIGH (ref 0.60–1.35)
Glucose: 80 mg/dL (ref 65–99)
Potassium, Serum / Plasma: 4 mmol/L (ref 3.5–5.3)
Sodium, Serum / Plasma: 140 mmol/L (ref 135–146)
Urea Nitrogen, Serum / Plasma: 38 mg/dL — ABNORMAL HIGH (ref 7–25)
eGFR - high estimate: 34 mL/min/{1.73_m2} — ABNORMAL LOW (ref >=60)
eGFR - low estimate: 29 mL/min/{1.73_m2} — ABNORMAL LOW (ref >=60)

## 2019-07-13 LAB — COMPLETE BLOOD COUNT WITH DIFF
% Basophils: 0.8 %
% Eosinophils: 1.9 %
% Monocytes: 11.7 %
% Neutrophils: 78.4 %
Abs Basophils: 21 cells/uL (ref 0–200)
Abs Eosinophils: 49 cells/uL (ref 15–500)
Abs Lymphocytes: 187 cells/uL — ABNORMAL LOW (ref 850–3900)
Abs Monocytes: 304 cells/uL (ref 200–950)
Abs NRBC: 0 cells/uL
Abs Neutrophils: 2038 cells/uL (ref 1500–7800)
Hematocrit: 35.1 % — ABNORMAL LOW (ref 38.5–50.0)
Hemoglobin: 11.3 g/dL — ABNORMAL LOW (ref 13.2–17.1)
Lymphs: 7.2 %
MCH: 26.7 pg — ABNORMAL LOW (ref 27.0–33.0)
MCHC: 32.2 g/dL (ref 32.0–36.0)
MCV: 83 fL (ref 80.0–100.0)
MPV: 10.6 fL (ref 7.5–12.5)
Platelet Count: 329 10*3/uL (ref 140–400)
RBC Count: 4.23 10*6/uL (ref 4.20–5.80)
RDW: 13.1 % (ref 11.0–15.0)
WBC Count: 2.6 10*3/uL — ABNORMAL LOW (ref 3.8–10.8)

## 2019-07-13 LAB — TACROLIMUS, HIGHLY SENSITIVE,: Tacrolimus: 7.5 mcg/L

## 2019-07-13 LAB — PROTEIN, TOTAL, RANDOM URINE
Creatinine, Random Urine: 238 mg/dL (ref 20–320)
Prot Concentration,UR: 71 mg/dL — ABNORMAL HIGH (ref 5–25)
Protein/Creat Ratio, Urine: 298 mg/g creat — ABNORMAL HIGH (ref 22–128)
Protein/Creatinine Ratio, Rand: 0.298 mg/mg creat — ABNORMAL HIGH (ref 0.022–0.128)

## 2019-07-13 LAB — PHOSPHORUS, SERUM / PLASMA: Phosphorus, Serum / Plasma: 3.9 mg/dL (ref 2.5–4.5)

## 2019-07-13 LAB — MAGNESIUM, SERUM / PLASMA: Magnesium, Serum / Plasma: 1.8 mg/dL (ref 1.5–2.5)

## 2019-07-13 NOTE — Progress Notes (Signed)
05/20/2019 (Kidney)     Issue(s):  Patient stated he took his meds at 1 AM this morning d/t deleting his alarm. He usually takes them at 9AM/9PM.    RN action:   RN advised him to take his regularly scheduled PM dose. Noted patient has labs drawn 11 AM-1 PM. In detail, RN reviewed the importance of the recommended 12-hours between doses and 12-hours after night time dose schedule. Advised that being under or over-immunosuppressed can lead to negative outcomes including kidney rejection.    Per patient he was not aware of this recommendation and would have his son take him to the lab closer to 9 AM.

## 2019-07-14 ENCOUNTER — Ambulatory Visit: Admit: 2019-07-15 | Discharge: 2019-07-23 | Payer: MEDICARE | Attending: Nephrology | Primary: Physician

## 2019-07-14 DIAGNOSIS — T8619 Other complication of kidney transplant: Secondary | ICD-10-CM

## 2019-07-14 DIAGNOSIS — Z940 Kidney transplant status: Secondary | ICD-10-CM

## 2019-07-14 DIAGNOSIS — T8389XA Other specified complication of genitourinary prosthetic devices, implants and grafts, initial encounter: Secondary | ICD-10-CM

## 2019-07-14 DIAGNOSIS — N051 Unspecified nephritic syndrome with focal and segmental glomerular lesions: Secondary | ICD-10-CM

## 2019-07-14 DIAGNOSIS — D72819 Decreased white blood cell count, unspecified: Secondary | ICD-10-CM

## 2019-07-14 DIAGNOSIS — I10 Essential (primary) hypertension: Secondary | ICD-10-CM

## 2019-07-14 DIAGNOSIS — Z79899 Other long term (current) drug therapy: Secondary | ICD-10-CM

## 2019-07-14 DIAGNOSIS — Z9189 Other specified personal risk factors, not elsewhere classified: Secondary | ICD-10-CM

## 2019-07-14 MED ORDER — AMLODIPINE 10 MG TABLET
10 | ORAL_TABLET | Freq: Every day | ORAL | 5 refills | Status: DC
Start: 2019-07-14 — End: 2019-10-14

## 2019-07-14 MED ORDER — MYCOPHENOLATE MOFETIL 250 MG CAPSULE
250 | ORAL_CAPSULE | Freq: Two times a day (BID) | ORAL | 11 refills | Status: DC
Start: 2019-07-14 — End: 2019-07-21

## 2019-07-14 MED ORDER — VALGANCICLOVIR 450 MG TABLET
450 | ORAL_TABLET | ORAL | 2 refills | Status: DC
Start: 2019-07-14 — End: 2019-08-19

## 2019-07-14 MED ORDER — SULFAMETHOXAZOLE 800 MG-TRIMETHOPRIM 160 MG TABLET
800-160 | ORAL_TABLET | ORAL | 4 refills | Status: DC
Start: 2019-07-14 — End: 2020-05-19

## 2019-07-14 NOTE — Progress Notes (Addendum)
POST-KIDNEY TRANSPLANT NEPHROLOGY PROGRESS NOTE         Subjective      Chief Complaint   Patient presents with    Kidney Transplant Follow-up       History of Present Illness:  [name] is a 44 y.o. year old male S/P kidney transplant in 05/20/2019, being seen for a follow-up visit. The patient feels well and has no new complaints.   Ureteral stent was removed. Denies any hematuria.He has good urine flow.  His TDC was also removed locally.      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 Acute tubular epithelia injury; see comment.   2. No evidence of rejection, negative C4d stain.   2. No significant tubulointerstitial chronicity.   3. Moderate arteriosclerosis, donor-derived.     Special Stain Summary: (x8) PAS and trichrome special stains were  performed and examined to confirm the diagnosis. The findings are  consistent with our H&E-based light microscopic impression. Six  additional PAS-stained level sections were evaluated, and no segmentally  sclerotic glomeruli are identified.     DSAs not done      Immunosuppressive management encounter following kidney transplant 05/21/2019     Induction agent/dose: Thymo lite  Regime: Tac/MMF  Corticosteroid plan: maintenance   cPRA: 10%       Transplant follow-up 05/21/2019     Category Problem Type of follow-up needed in clinic? Ordered? Scheduled? Comments   Nursing (e.g., drain/ Foley) HD tunneled line refer to IR for removal when appropriate Needs to be done in clinic weekly dressing changes in HD clinic until can be removed   Lab/ Micro Standard Tac Follow up results   Needs to be ordered None   Imaging studies/ procedures NA NA NA None   Referrals Uroglogy for stent removal NA Already ordered None   Delayed graft function and improving UOP, monitor for resolution        Ureteral stent retained of kidney transplant (CMS code) 05/21/2019     Implant Name Type Inv. Item Serial No. Manufacturer Lot No. LRB No. Used Action   STENT URETHRAL 6FR X 16CM DOUBLE J   [id number] - [id number] Stents-Non Des STENT URETHRAL 6FR X 16CM DOUBLE J   [id number]   [id number] Right 1 Implanted     Referral placed for outpatient stent removal    07/01/19 stent removed.       At risk for opportunistic infections 05/21/2019     Transplant Serology Workup:  Recipient        Lab Results   Component Value Date    HBCAB NEG 12/17/2018    HBSAG NEG 12/17/2018    HBABQ >800 12/17/2018    HCV NEG 12/17/2018  RPR Nonreactive 12/17/2018    CMVAB POS (A) 12/17/2018    VCAM NEG 12/17/2018    EBNA POS (A) 12/17/2018    TOXO NEG 12/17/2018         Donor        Cadaver Donor UNOS ID (no units)   Date Value   05/20/2019 [id number]     Match ID: [id number]          Induction Plan:   Thymo + steroid maintenance     Plan for PPX:   Toxo prophylaxis: (D +/R - Toxo MISMATCH), Septra DS 1 tab PO Daily x 1 month (06/19/2019), then qMWF x 11 months more (05/19/2020) -- 1 year total for Toxo Mismatch   PCP prophylaxis: Septra DS as per above for Toxo ppx   CMV prophylaxis: (D +/R +), Valcyte 900 mg PO Daily (adjusted for renal function) x 3 months (08/18/2019)   Fungal prophylaxis: Fluconazole 100 mg PO q7days x 1 month (06/18/2019)  HBV prophylaxis: Not necessary             At risk of infection transmitted from donor 05/21/2019     Patient underwent transplant with no known donor derived risk factors: Donor is not CDC high risk, not HBV core ab + and there are no known donor derived infections. No need for treatment or monitoring beyond the usual opportunistic infection prophylaxis per protocol. Donor with no known malignancies.  The donor's CMV status is IgG positive and the recipient's CMV status is IgG positive.  Prophylaxis will be determined by risk of CMV transmission.                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     Dialysis Type: HD Hemodialysis  Dialysis Center: [facility]         Phone: [phone]        Fax: [phone]       Address: 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  Been on HD since 2010.  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             Objective          Physical Exam:   General: Well-appearing. No acute distress.  HEENT: Normocephalic and atraumatic. Legally blind    Chest: Unlabored respirations.   Skin: No obvious lesions or rash   Psychiatric: Normal mood and affect.   Neurological: Alert and oriented x 3. Tremor absent..       Review of Prior Testing  I personally reviewed data as noted below including the following:     1. Metabolic panel  2. Complete blood count  3. Level of immunosuppressive drug (tacrolimus).  4. Urine chemistry including level of proteinuria      Lab Results   Component Value Date    Creatinine 2.12 (H) 06/15/2019    Creatinine 2.52 (H) 06/09/2019    Creatinine 3.74 (H) 06/04/2019    Creatinine, Serum / Plasma 2.58 (H) 07/10/2019    Creatinine, Serum / Plasma 2.17 (H) 06/26/2019    Creatinine, Serum / Plasma 1.64 (H) 06/08/2019    Urea Nitrogen, Serum / Plasma 38 (H) 07/10/2019    Urea Nitrogen, Serum / Plasma 22 06/26/2019    Urea Nitrogen, Serum / Plasma 28 (H) 06/15/2019    Sodium, Serum / Plasma 140 07/10/2019    Sodium, Serum / Plasma 137 06/26/2019    Sodium, Serum / Plasma 138 06/15/2019    Potassium, Serum / Plasma 4.0 07/10/2019    Potassium, Serum / Plasma 4.4 06/26/2019    Potassium, Serum / Plasma 4.2 06/15/2019    Carbon Dioxide, Total 23 07/10/2019    Carbon Dioxide, Total 23 06/26/2019    Carbon Dioxide, Total 21 (L) 06/15/2019    Calcium, total, Serum / Plasma 9.8 07/10/2019    Calcium, total, Serum / Plasma 9.8 06/26/2019    Calcium, total, Serum / Plasma 9.4 06/15/2019    Phosphorus, Serum / Plasma 3.9 07/10/2019    Phosphorus, Serum / Plasma 3.5 06/26/2019    Phosphorus, Serum / Plasma 3.5 06/15/2019     Lab Results   Component Value Date    WBC Count 2.6 (L) 07/10/2019    WBC Count 3.0 (L) 06/26/2019    WBC Count 3.6 06/15/2019    Hemoglobin 11.3 (L) 07/10/2019    Hemoglobin 11.2 (L) 06/26/2019    Hemoglobin 10.9 (L) 06/15/2019    Hematocrit 35.1 (L) 07/10/2019    Hematocrit 34.1 (L) 06/26/2019    Hematocrit 33.6 (L) 06/15/2019    MCV 83.0 07/10/2019    MCV 83.0 06/26/2019    MCV 84 06/15/2019    Platelet Count 329 07/10/2019    Platelet Count 303 06/26/2019    Platelet Count 264 06/15/2019       Lab Results   Component Value Date    Tacrolimus 7.5 07/10/2019    Tacrolimus 8.8 06/26/2019    Tacrolimus 5.9 06/15/2019    Tacrolimus 5.2 06/09/2019    Tacrolimus 3.2 (L) 06/08/2019     Tacrolimus 5.0 06/04/2019          Assessment and Plan    Deceased-donor kidney transplant recipient  S/p DDRT 05/20/2019; 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  Last HD was 06/08/2019.  Repeat comprehensive panel.  If still uptrending creatinine then will need renal allograft biopsy and DSA.    Immunosuppressive management encounter following kidney transplant  cPRA 10%. He received thymolite for induction  Increase tacrolimus to 9 mg po BID with goal trough 8-10 ng/ml.Reduce  MMF to 750 mg po bid  for leucopenia.  Continue prednisone 5 mg daily.  Last FK level on 07/10/2019 was 7.5 ng/ml was 13.5 hr level  Discussed importance of 12 hr tacrolimus level.    At risk for opportunistic infections    Continue Septra MWF until 05/19/2020 for toxo prophylaxis.  Continue valcyte ( renally dosed )  until 08/18/2019.    Ureteral stent retained of kidney transplant (CMS code)  Ureteral stent was removed 07/01/19. He has no hematuria and good urine output.  FSGS (focal segmental glomerulosclerosis)  Etiology of ESRD was biopsy-proven FSGS. Urine protein to creatinine ratio 298 mg/g creat on 07/10/2019 Transplant kidney, biopsy (5 days post-transplant):   1. Acute tubular epithelia injury; see comment.   2. No evidence of rejection, negative C4d stain.   2. No significant tubulointerstitial chronicity.   3. Moderate arteriosclerosis, donor-derived  No FSGS seen.    HTN (hypertension)  BP controlled on amlodipine 10mg  po daily.  Dietary changes advised.    Leucopenia  WBC 2.6 Thousand/uL Reduce MMF to 750 mg twice a day. Send CMV PCR.       Visit Diagnoses and Associated Orders Placed:  1. Deceased-donor kidney transplant recipient  sulfamethoxazole-trimethoprim (SEPTRA DS) 800-160 mg tablet    mycophenolate (CELLCEPT) 250 mg capsule   2. Immunosuppressive management encounter following kidney transplant  sulfamethoxazole-trimethoprim (SEPTRA DS) 800-160 mg tablet     mycophenolate (CELLCEPT) 250 mg capsule   3. At risk for opportunistic infections     4. Ureteral stent retained of kidney transplant (CMS code)     5. FSGS (focal segmental glomerulosclerosis)     6. Essential hypertension  amLODIPine (NORVASC) 10 mg tablet   7. Leukopenia, unspecified type          Nutritional assessment: Patient appears well developed and well-nourished. No need for a dietary consult at this time   Pharmacy assessment: Current medications reviewed and discussed with patient. No need for a pharmacy consult at this time.    Continue to monitor kidney function, CBC, chem-7, drug levels once a week.  Monitor liver function, lipid panel, hemoglobin A1C, intact PTH, urine pr/cr ratio every 1-3 months.       Health Care Maintenance:   Recommend usual age- and sex-appropriate cancer screening plus annual dermatology visit for skin cancer surveillance   Recommend age and disease-appropriate vaccinations (killed vaccines only) including annual influenza vaccine. Live vaccines are contraindicated.     Risk Assessment: The patient is at increased risk of complications including drug toxicity, infection and malignancy as a result of immunosuppression for his kidney transplant and requires close monitoring of immunosuppression levels and dose adjustment to manage toxicities.   There is no evidence of immunosuppressive drug toxicity at this visit.               I spent a total of 41 minutes on this patient's care on the day of their visit excluding time spent related to any billed procedures. This time includes face-to-face time with the patient as well as time spent documenting in the medical record, reviewing patient's records and tests, obtaining history, placing orders, communicating with other healthcare professionals, counseling the patient, family, or caregiver, and/or care coordination for the diagnoses above.         I performed this consultation using real-time Telehealth tools, including a live video connection between my location and the patient's location. Prior to initiating the consultation, I obtained informed verbal consent to perform this consultation using Telehealth tools and answered all the questions about the Telehealth interaction.  My location is in a [place] clinical facility.

## 2019-07-20 NOTE — Telephone Encounter (Signed)
05/20/2019 (Kidney)    Creatinine 2.18, 07/17/19    Issue(s): 07/17/19 WBC/ANC 2.0/1,372.  07/10/19 results 2.6/2,038    MMF 750 mg BID, Valcyte 450 mg M/W/F.     RN action:  Note to Dr. Sharlee Blew.    Dr. Laurell Roof Plan-of-Care:   Decrease MMF to 250 mg po BID.     RN action:   [facility] message sent to patient with plan-of-care.         Medication pended for Dr. Laurell Roof signature.

## 2019-07-21 LAB — COMPLETE BLOOD COUNT WITH DIFF
% Basophils: 1 %
% Eosinophils: 1.5 %
% Monocytes: 19.8 %
% Neutrophils: 68.6 %
Abs Basophils: 20 cells/uL (ref 0–200)
Abs Eosinophils: 30 cells/uL (ref 15–500)
Abs Lymphocytes: 182 cells/uL — ABNORMAL LOW (ref 850–3900)
Abs Monocytes: 396 cells/uL (ref 200–950)
Abs NRBC: 0 cells/uL
Abs Neutrophils: 1372 cells/uL — ABNORMAL LOW (ref 1500–7800)
Hematocrit: 35.4 % — ABNORMAL LOW (ref 38.5–50.0)
Hemoglobin: 11.4 g/dL — ABNORMAL LOW (ref 13.2–17.1)
Lymphs: 9.1 %
MCH: 26.4 pg — ABNORMAL LOW (ref 27.0–33.0)
MCHC: 32.2 g/dL (ref 32.0–36.0)
MCV: 81.9 fL (ref 80.0–100.0)
MPV: 10.6 fL (ref 7.5–12.5)
Platelet Count: 302 10*3/uL (ref 140–400)
RBC Count: 4.32 10*6/uL (ref 4.20–5.80)
RDW: 13.1 % (ref 11.0–15.0)
WBC Count: 2 10*3/uL — ABNORMAL LOW (ref 3.8–10.8)

## 2019-07-21 LAB — BASIC METABOLIC PANEL (NA, K,
BUN/Creatinine Ratio: 14 (calc) (ref 6–22)
Calcium, total, Serum / Plasma: 9.6 mg/dL (ref 8.6–10.3)
Carbon Dioxide, Total: 24 mmol/L (ref 20–32)
Chloride, Serum / Plasma: 107 mmol/L (ref 98–110)
Creatinine, Serum / Plasma: 2.15 mg/dL — ABNORMAL HIGH (ref 0.60–1.35)
Glucose: 104 mg/dL — ABNORMAL HIGH (ref 65–99)
Potassium, Serum / Plasma: 3.8 mmol/L (ref 3.5–5.3)
Sodium, Serum / Plasma: 138 mmol/L (ref 135–146)
Urea Nitrogen, Serum / Plasma: 30 mg/dL — ABNORMAL HIGH (ref 7–25)
eGFR - high estimate: 42 mL/min/{1.73_m2} — ABNORMAL LOW (ref >=60)
eGFR - low estimate: 36 mL/min/{1.73_m2} — ABNORMAL LOW (ref >=60)

## 2019-07-21 LAB — PROTEIN, TOTAL, RANDOM URINE
Creatinine, Random Urine: 389 mg/dL — ABNORMAL HIGH (ref 20–320)
Prot Concentration,UR: 87 mg/dL — ABNORMAL HIGH (ref 5–25)
Protein/Creat Ratio, Urine: 224 mg/g creat — ABNORMAL HIGH (ref 22–128)
Protein/Creatinine Ratio, Rand: 0.224 mg/mg creat — ABNORMAL HIGH (ref 0.022–0.128)

## 2019-07-21 LAB — TACROLIMUS, HIGHLY SENSITIVE,: Tacrolimus: 9.1 mcg/L

## 2019-07-21 LAB — MAGNESIUM, SERUM / PLASMA: Magnesium, Serum / Plasma: 1.6 mg/dL (ref 1.5–2.5)

## 2019-07-21 LAB — PHOSPHORUS, SERUM / PLASMA: Phosphorus, Serum / Plasma: 3.1 mg/dL (ref 2.5–4.5)

## 2019-07-23 MED ORDER — MYCOPHENOLATE MOFETIL 250 MG CAPSULE
250 | ORAL_CAPSULE | ORAL | 11 refills | Status: DC
Start: 2019-07-23 — End: 2020-03-14

## 2019-07-23 NOTE — Telephone Encounter (Signed)
LogEvent: [id number]  Call #: [phone]  Time Sent: 07/23/2019 16:56 MT    Caller Name: Carlito Bogert  Tel: [phone] Ext:   From: [facility]    Message Type: General Message  Message: Pt. Name: Cody Myers  DOB: Oct 20, 1975  [facility]  You are seen at [facility] in [place], correct?  yes Date of transplant (if applicable): 05/21/2019  RE: needs a script refill on Amlodipine. pharmacy was told it was filled somewhere else but he didnt have that Non-Urgent.    For Organization: [facility] (Business Hours)  Name:   Role:       [employer]IDENT[employer]ET[employer], [employer]    LogEvent: [id number]

## 2019-07-28 NOTE — Progress Notes (Signed)
Called pt and LVM re: amlodipine and to back in the clinic.

## 2019-08-03 NOTE — Telephone Encounter (Signed)
Issues:     (1) Patient did not read 07/21/19 MC plan-of-care to decrease MMF to 250 mg BID for WBC of 2.0. WBC remains at 2.0 from 07/17/19.    RN Action: Called patient with plan-of-care.  No answer. LM with plan-of-care and asked patient to check for [facility] message at least once per day as this is the primary means of communication with patient.     Sent new message to patient with same plan-of-care from 07/21/19. Requested response.     (2) 07/31/19 tac level 6.7, dose 9/9, target 8-10.      RN Action: MC message to patient. Requested response.

## 2019-08-03 NOTE — Progress Notes (Signed)
Patient has consent to participate in the OKRA study.   AlloSure was ordered on 07/30/19 as per protocol for routine monitoring. The Allosure result was 0.46%. Based on the AlloSure result and other clinical information, the patient was informed of the following management plan: low WBC, mycophenolate dose reduced in response, Cr at baseline.  routine clinic follow up and labs per guideline

## 2019-08-04 LAB — COMPLETE BLOOD COUNT WITH DIFF
% Basophils: 1 %
% Eosinophils: 1.5 %
% Monocytes: 17.8 %
% Neutrophils: 69.8 %
Abs Basophils: 20 cells/uL (ref 0–200)
Abs Eosinophils: 30 cells/uL (ref 15–500)
Abs Lymphocytes: 198 cells/uL — ABNORMAL LOW (ref 850–3900)
Abs Monocytes: 356 cells/uL (ref 200–950)
Abs NRBC: 0 cells/uL
Abs Neutrophils: 1396 cells/uL — ABNORMAL LOW (ref 1500–7800)
Hematocrit: 37.6 % — ABNORMAL LOW (ref 38.5–50.0)
Hemoglobin: 11.9 g/dL — ABNORMAL LOW (ref 13.2–17.1)
Lymphs: 9.9 %
MCH: 26.6 pg — ABNORMAL LOW (ref 27.0–33.0)
MCHC: 31.6 g/dL — ABNORMAL LOW (ref 32.0–36.0)
MCV: 83.9 fL (ref 80.0–100.0)
MPV: 10.6 fL (ref 7.5–12.5)
Platelet Count: 295 10*3/uL (ref 140–400)
RBC Count: 4.48 10*6/uL (ref 4.20–5.80)
RDW: 13.1 % (ref 11.0–15.0)
WBC Count: 2 10*3/uL — ABNORMAL LOW (ref 3.8–10.8)

## 2019-08-04 LAB — BASIC METABOLIC PANEL (NA, K,
BUN/Creatinine Ratio: 12 (calc) (ref 6–22)
Calcium, total, Serum / Plasma: 9.5 mg/dL (ref 8.6–10.3)
Carbon Dioxide, Total: 27 mmol/L (ref 20–32)
Chloride, Serum / Plasma: 105 mmol/L (ref 98–110)
Creatinine, Serum / Plasma: 1.92 mg/dL — ABNORMAL HIGH (ref 0.60–1.35)
Glucose: 92 mg/dL (ref 65–99)
Potassium, Serum / Plasma: 3.9 mmol/L (ref 3.5–5.3)
Sodium, Serum / Plasma: 139 mmol/L (ref 135–146)
Urea Nitrogen, Serum / Plasma: 23 mg/dL (ref 7–25)
eGFR - high estimate: 48 mL/min/{1.73_m2} — ABNORMAL LOW (ref >=60)
eGFR - low estimate: 42 mL/min/{1.73_m2} — ABNORMAL LOW (ref >=60)

## 2019-08-04 LAB — PHOSPHORUS, SERUM / PLASMA: Phosphorus, Serum / Plasma: 2.8 mg/dL (ref 2.5–4.5)

## 2019-08-04 LAB — PROTEIN, TOTAL, RANDOM URINE
Creatinine, Random Urine: 237 mg/dL (ref 20–320)
Prot Concentration,UR: 52 mg/dL — ABNORMAL HIGH (ref 5–25)
Protein/Creat Ratio, Urine: 219 mg/g creat — ABNORMAL HIGH (ref 22–128)
Protein/Creatinine Ratio, Rand: 0.219 mg/mg creat — ABNORMAL HIGH (ref 0.022–0.128)

## 2019-08-04 LAB — TACROLIMUS, HIGHLY SENSITIVE,: Tacrolimus: 6.7 mcg/L

## 2019-08-04 LAB — BK VIRUS, DNA, QUANTITATIVE, P: BK Virus DNA: NOT DETECTED copies/mL

## 2019-08-04 LAB — MAGNESIUM, SERUM / PLASMA: Magnesium, Serum / Plasma: 1.7 mg/dL (ref 1.5–2.5)

## 2019-08-07 LAB — COMPLETE BLOOD COUNT WITH DIFF
% Basophils: 1 %
% Eosinophils: 2.5 %
% Monocytes: 18.7 %
% Neutrophils: 65.5 %
Abs Basophils: 20 cells/uL (ref 0–200)
Abs Eosinophils: 50 cells/uL (ref 15–500)
Abs Lymphocytes: 246 cells/uL — ABNORMAL LOW (ref 850–3900)
Abs Monocytes: 374 cells/uL (ref 200–950)
Abs NRBC: 0 cells/uL
Abs Neutrophils: 1310 cells/uL — ABNORMAL LOW (ref 1500–7800)
Hematocrit: 37.8 % — ABNORMAL LOW (ref 38.5–50.0)
Hemoglobin: 12.1 g/dL — ABNORMAL LOW (ref 13.2–17.1)
Lymphs: 12.3 %
MCH: 25.7 pg — ABNORMAL LOW (ref 27.0–33.0)
MCHC: 32 g/dL (ref 32.0–36.0)
MCV: 80.3 fL (ref 80.0–100.0)
MPV: 10.7 fL (ref 7.5–12.5)
Platelet Count: 323 10*3/uL (ref 140–400)
RBC Count: 4.71 10*6/uL (ref 4.20–5.80)
RDW: 12.9 % (ref 11.0–15.0)
WBC Count: 2 10*3/uL — ABNORMAL LOW (ref 3.8–10.8)

## 2019-08-07 LAB — TACROLIMUS, HIGHLY SENSITIVE,: Tacrolimus: 7.3 mcg/L

## 2019-08-07 LAB — BASIC METABOLIC PANEL (NA, K,
BUN/Creatinine Ratio: 11 (calc) (ref 6–22)
Calcium, total, Serum / Plasma: 9.9 mg/dL (ref 8.6–10.3)
Carbon Dioxide, Total: 27 mmol/L (ref 20–32)
Chloride, Serum / Plasma: 105 mmol/L (ref 98–110)
Creatinine, Serum / Plasma: 1.85 mg/dL — ABNORMAL HIGH (ref 0.60–1.35)
Glucose: 94 mg/dL (ref 65–139)
Potassium, Serum / Plasma: 3.8 mmol/L (ref 3.5–5.3)
Sodium, Serum / Plasma: 139 mmol/L (ref 135–146)
Urea Nitrogen, Serum / Plasma: 21 mg/dL (ref 7–25)
eGFR - high estimate: 51 mL/min/{1.73_m2} — ABNORMAL LOW (ref >=60)
eGFR - low estimate: 44 mL/min/{1.73_m2} — ABNORMAL LOW (ref >=60)

## 2019-08-07 LAB — PROTEIN, TOTAL, RANDOM URINE
Creatinine, Random Urine: 285 mg/dL (ref 20–320)
Prot Concentration,UR: 53 mg/dL — ABNORMAL HIGH (ref 5–25)
Protein/Creat Ratio, Urine: 186 mg/g creat — ABNORMAL HIGH (ref 22–128)
Protein/Creatinine Ratio, Rand: 0.186 mg/mg creat — ABNORMAL HIGH (ref 0.022–0.128)

## 2019-08-07 LAB — MAGNESIUM, SERUM / PLASMA: Magnesium, Serum / Plasma: 1.6 mg/dL (ref 1.5–2.5)

## 2019-08-07 LAB — PHOSPHORUS, SERUM / PLASMA: Phosphorus, Serum / Plasma: 3.2 mg/dL (ref 2.5–4.5)

## 2019-08-07 NOTE — Telephone Encounter (Signed)
RN called and spoke to patient's nephew, Onalee Hua, who is his caregiver and checks MC messages. RN again reminded Onalee Hua the importance of responding to those messages so KTU can correlate labs with message read and being acted upon.     Per Onalee Hua, patient reduced CellCept to 250 mg BID 2-3 days ago. Message sent 07/21/19. 08/05/19 WBC 2.0. advised on infection reduction precautions.     08/05/19 Tacrolimus level 7.3. Per Onalee Hua, this is a true trough. Patient continues on 9/9.     Note to Dr. Sharlee Blew for tac adjustment.     Dr. Laurell Roof plan-of-care: Increase Tacrolimus to 9 mg po AM and 10 mg po PM.     RN Action: MC message to patient with plan-of-care.     Medication pended or Dr. Laurell Roof signature.

## 2019-08-07 NOTE — Progress Notes (Signed)
08/03/19 allosure resulted on 08/06/19. result of 0.46% reported to dr. Lequita Asal.  Electronically entered by: Roanna Banning on 08/07/2019 3:22:00 PM

## 2019-08-10 MED ORDER — TACROLIMUS 1 MG CAPSULE, IMMEDIATE-RELEASE
1 | ORAL_CAPSULE | ORAL | 11 refills | Status: DC
Start: 2019-08-10 — End: 2019-08-21

## 2019-08-11 NOTE — Progress Notes (Signed)
Received call from patient stating that he did not receive a call for his VV today with Dr. Nedra Hai. Patient was scheduled for today (08/11/19) @11 :20 AM.     Note to re: appointment notes "**Per Dr.Lee (email Mon2.23.21) 12:28 PM**  VV with DOD per Dr 01-15-1991 on 07/14/19).     Unclear if visit was rescheduled.

## 2019-08-12 LAB — COMPLETE BLOOD COUNT WITH DIFF
% Basophils: 0.9 %
% Eosinophils: 0.9 %
% Monocytes: 16 %
% Neutrophils: 71.8 %
Abs Basophils: 19 cells/uL (ref 0–200)
Abs Eosinophils: 19 cells/uL (ref 15–500)
Abs Lymphocytes: 218 cells/uL — ABNORMAL LOW (ref 850–3900)
Abs Monocytes: 336 cells/uL (ref 200–950)
Abs NRBC: 0 cells/uL
Abs Neutrophils: 1508 cells/uL (ref 1500–7800)
Hematocrit: 38.9 % (ref 38.5–50.0)
Hemoglobin: 12.6 g/dL — ABNORMAL LOW (ref 13.2–17.1)
Lymphs: 10.4 %
MCH: 26.6 pg — ABNORMAL LOW (ref 27.0–33.0)
MCHC: 32.4 g/dL (ref 32.0–36.0)
MCV: 82.2 fL (ref 80.0–100.0)
MPV: 11 fL (ref 7.5–12.5)
Platelet Count: 315 10*3/uL (ref 140–400)
RBC Count: 4.73 10*6/uL (ref 4.20–5.80)
RDW: 12.8 % (ref 11.0–15.0)
WBC Count: 2.1 10*3/uL — ABNORMAL LOW (ref 3.8–10.8)

## 2019-08-12 LAB — MAGNESIUM, SERUM / PLASMA: Magnesium, Serum / Plasma: 1.6 mg/dL (ref 1.5–2.5)

## 2019-08-12 LAB — TACROLIMUS, HIGHLY SENSITIVE,: Tacrolimus: 8.5 mcg/L

## 2019-08-12 LAB — PROTEIN, TOTAL, RANDOM URINE
Creatinine, Random Urine: 260 mg/dL (ref 20–320)
Prot Concentration,UR: 58 mg/dL — ABNORMAL HIGH (ref 5–25)
Protein/Creat Ratio, Urine: 223 mg/g creat — ABNORMAL HIGH (ref 22–128)
Protein/Creatinine Ratio, Rand: 0.223 mg/mg creat — ABNORMAL HIGH (ref 0.022–0.128)

## 2019-08-12 LAB — BASIC METABOLIC PANEL (NA, K,
BUN/Creatinine Ratio: 13 (calc) (ref 6–22)
Calcium, total, Serum / Plasma: 9.6 mg/dL (ref 8.6–10.3)
Carbon Dioxide, Total: 23 mmol/L (ref 20–32)
Chloride, Serum / Plasma: 107 mmol/L (ref 98–110)
Creatinine, Serum / Plasma: 1.5 mg/dL — ABNORMAL HIGH (ref 0.60–1.35)
Glucose: 82 mg/dL (ref 65–139)
Potassium, Serum / Plasma: 3.8 mmol/L (ref 3.5–5.3)
Sodium, Serum / Plasma: 140 mmol/L (ref 135–146)
Urea Nitrogen, Serum / Plasma: 20 mg/dL (ref 7–25)
eGFR - high estimate: 65 mL/min/{1.73_m2} (ref >=60)
eGFR - low estimate: 56 mL/min/{1.73_m2} — ABNORMAL LOW (ref >=60)

## 2019-08-12 LAB — PHOSPHORUS, SERUM / PLASMA: Phosphorus, Serum / Plasma: 3.2 mg/dL (ref 2.5–4.5)

## 2019-08-14 NOTE — Progress Notes (Signed)
Called pt and LVM to call back in the clinic.

## 2019-08-19 ENCOUNTER — Ambulatory Visit: Admit: 2019-08-19 | Discharge: 2019-09-02 | Payer: MEDICARE | Attending: Nephrology | Primary: Physician

## 2019-08-19 DIAGNOSIS — Z9189 Other specified personal risk factors, not elsewhere classified: Secondary | ICD-10-CM

## 2019-08-19 DIAGNOSIS — I10 Essential (primary) hypertension: Secondary | ICD-10-CM

## 2019-08-19 DIAGNOSIS — Z940 Kidney transplant status: Secondary | ICD-10-CM

## 2019-08-19 DIAGNOSIS — D72819 Decreased white blood cell count, unspecified: Secondary | ICD-10-CM

## 2019-08-19 DIAGNOSIS — Z79899 Other long term (current) drug therapy: Secondary | ICD-10-CM

## 2019-08-19 NOTE — Assessment & Plan Note (Addendum)
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  If still uptrending creatinine then will need renal allograft biopsy and DSA.  Urine protein to creatinine ratio 0.3 g/g creatinine.

## 2019-08-19 NOTE — Assessment & Plan Note (Signed)
cPRA 10%. Hereceived thymolite for induction  Tacrolimus9mg  in am and 10 mg in pm with goal trough 8-10ng/ml.  Continue MMF 250 mg po bid ( reduced for leucopenia)  Continue prednisone 5 mg daily.  Last FK level pending from 08/18/2019, this will be a 14 hr level.  Discussed importance of 12 hr tacrolimus level.

## 2019-08-19 NOTE — Assessment & Plan Note (Signed)
Continue Septra MWF until 05/19/2020 for toxo prophylaxis.

## 2019-08-19 NOTE — Assessment & Plan Note (Addendum)
WBC 2.2 thousands/uL.  He is on  reduced dose cellcept.  CMV PCR with next labs.

## 2019-08-19 NOTE — Assessment & Plan Note (Addendum)
BP controlled on amlodipine 10 mg po daily.

## 2019-08-19 NOTE — Progress Notes (Signed)
POST-KIDNEY TRANSPLANT NEPHROLOGY PROGRESS NOTE         Subjective      Chief Complaint   Patient presents with    Kidney Transplant Follow-up       History of Present Illness:  Cody Myers is a 44 y.o. year old male S/P kidney transplant in 05/20/2019  being seen for a follow-up visit. The patient feels well and has no new complaints.     Since last visit :   Cellcept reduced to 250 mg BID for leucopenia.    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 Acute tubular epithelia injury; see comment.   2. No evidence of rejection, negative C4d stain.   2. No significant tubulointerstitial chronicity.   3. Moderate arteriosclerosis, donor-derived.     Special Stain Summary: (x8) PAS and trichrome special stains were  performed and examined to confirm the diagnosis. The findings are  consistent with our H&E-based light microscopic impression. Six  additional PAS-stained level sections were evaluated, and no segmentally  sclerotic glomeruli are identified.     DSAs not done      Immunosuppressive management encounter following kidney transplant 05/21/2019     Induction agent/dose: Thymo lite  Regime: Tac/MMF  Corticosteroid plan: maintenance   cPRA: 10%      Transplant follow-up 05/21/2019      Category Problem Type of follow-up needed in clinic? Ordered? Scheduled? Comments   Nursing (e.g., drain/ Foley) HD tunneled line refer to IR for removal when appropriate Needs to be done in clinic weekly dressing changes in HD clinic until can be removed   Lab/ Micro Standard Tac Follow up results   Needs to be ordered None   Imaging studies/ procedures NA NA NA None   Referrals Uroglogy for stent removal NA Already ordered None   Delayed graft function and improving UOP, monitor for resolution        Ureteral stent retained of kidney transplant (CMS code) 05/21/2019     Implant Name Type Inv. Item Serial No. Manufacturer Lot No. LRB No. Used Action   STENT URETHRAL 6FR X 16CM DOUBLE J   [id number] - [id number] Stents-Non Des STENT URETHRAL 6FR X 16CM DOUBLE J   [id number]   [id number] Right 1 Implanted     Referral placed for outpatient stent removal    07/01/19 stent removed.       At risk for opportunistic infections 05/21/2019     Transplant Serology Workup:  Recipient        Lab Results   Component Value Date    HBCAB NEG 12/17/2018    HBSAG NEG 12/17/2018    HBABQ >800 12/17/2018    HCV NEG 12/17/2018    RPR Nonreactive  12/17/2018    CMVAB POS (A) 12/17/2018    VCAM NEG 12/17/2018    EBNA POS (A) 12/17/2018    TOXO NEG 12/17/2018         Donor        Cadaver Donor UNOS ID (no units)   Date Value   05/20/2019 [id number]     Match ID: [id number]          Induction Plan:   Thymo + steroid maintenance     Plan for PPX:   Toxo prophylaxis: (D +/R - Toxo MISMATCH), Septra DS 1 tab PO Daily x 1 month (06/19/2019), then qMWF x 11 months more (05/19/2020) -- 1 year total for Toxo Mismatch   PCP prophylaxis: Septra DS as per above for Toxo ppx   CMV prophylaxis: (D +/R +), Valcyte 900 mg PO Daily (adjusted for renal function) x 3 months (08/18/2019)   Fungal prophylaxis: Fluconazole 100 mg PO q7days x 1 month (06/18/2019)  HBV prophylaxis: Not necessary            At risk of infection transmitted from donor 05/21/2019      Patient underwent transplant with no known donor derived risk factors: Donor is not CDC high risk, not HBV core ab + and there are no known donor derived infections. No need for treatment or monitoring beyond the usual opportunistic infection prophylaxis per protocol. Donor with no known malignancies.  The donor's CMV status is IgG positive and the recipient's CMV status is IgG positive.  Prophylaxis will be determined by risk of CMV transmission.                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     Dialysis Type: HD Hemodialysis  Dialysis Center: [facility] / [facility]         Phone: [phone]        Fax: [phone]       Address: 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  Been on HD since 2010.  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   Medication Sig    acetaminophen (TYLENOL) 500 mg tablet Take 2 tablets (1,000 mg total) by mouth every 8 (eight) hours as needed for Pain (mild pain) Max = 3000 mg/day    albuterol 90 mcg/actuation metered dose inhaler Inhale 1-2 puffs into the lungs every 6 (six) hours as needed for Wheezing or Shortness of Breath       amLODIPine (NORVASC) 10 mg tablet Take 1 tablet (10 mg total) by mouth daily     docusate sodium (COLACE) 250 mg capsule Take 1 capsule (250 mg total) by mouth Twice a day Hold for loose stools    oxyCODONE (  ROXICODONE) 5 mg tablet Take 1 tablet (5 mg total) by mouth every 6 (six) hours as needed (severe pain) (Patient taking differently: Take 10 mg by mouth every 6 (six) hours as needed (severe pain)   )    oxyCODONE-acetaminophen (PERCOCET) 10-325 mg tablet Take 1 tablet by mouth every 8 (eight) hours as needed for Pain (Lower back pain)       predniSONE (DELTASONE) 5 mg tablet Take 1 tablet (5 mg total) by mouth daily Starting 06/17/19 onwards.    senna (SENOKOT) 8.6 mg tablet Take 2 tablets (17.2 mg total) by mouth nightly as needed for Constipation    sulfamethoxazole-trimethoprim (SEPTRA DS) 800-160 mg tablet Take 1 tablet by mouth daily through 06/19/2019, then 1 tablet every Monday, Wednesday, and Friday Stop after 05/19/2020             Objective          Physical Exam:   General: Well-appearing. No acute distress.  HEENT: Normocephalic and atraumatic. Legally blind+  Chest: Unlabored respirations.   Skin: No obvious lesions or rash   Psychiatric: Normal mood and affect.   Neurological: Alert and oriented x 3. Tremor absent..       Review of Prior Testing  I personally reviewed data  as noted below including the following:    1. Metabolic panel  2. Complete blood count  3. Level of immunosuppressive drug (tacrolimus).  4. Urine chemistry including level of proteinuria         Lab Results   Component Value Date    Creatinine 2.12 (H) 06/15/2019    Creatinine 2.52 (H) 06/09/2019    Creatinine 3.74 (H) 06/04/2019    Creatinine, Serum / Plasma 1.82 (H) 08/18/2019    Creatinine, Serum / Plasma 1.50 (H) 08/10/2019    Creatinine, Serum / Plasma 1.85 (H) 08/05/2019    Urea Nitrogen, Serum / Plasma 20 08/18/2019    Urea Nitrogen, Serum / Plasma 20 08/10/2019    Urea Nitrogen, Serum / Plasma 21 08/05/2019    Sodium, Serum / Plasma 141 08/18/2019    Sodium, Serum / Plasma 140 08/10/2019     Sodium, Serum / Plasma 139 08/05/2019    Potassium, Serum / Plasma 3.9 08/18/2019    Potassium, Serum / Plasma 3.8 08/10/2019    Potassium, Serum / Plasma 3.8 08/05/2019    Carbon Dioxide, Total 29 08/18/2019    Carbon Dioxide, Total 23 08/10/2019    Carbon Dioxide, Total 27 08/05/2019    Calcium, total, Serum / Plasma 9.6 08/18/2019    Calcium, total, Serum / Plasma 9.6 08/10/2019    Calcium, total, Serum / Plasma 9.9 08/05/2019    Phosphorus, Serum / Plasma 3.1 08/18/2019    Phosphorus, Serum / Plasma 3.2 08/10/2019    Phosphorus, Serum / Plasma 3.2 08/05/2019     Lab Results   Component Value Date    WBC Count 2.1 (L) 08/10/2019    WBC Count 2.0 (L) 08/05/2019    WBC Count 2.0 (L) 07/31/2019    Hemoglobin 12.6 (L) 08/10/2019    Hemoglobin 12.1 (L) 08/05/2019    Hemoglobin 11.9 (L) 07/31/2019    Hematocrit 38.9 08/10/2019    Hematocrit 37.8 (L) 08/05/2019    Hematocrit 37.6 (L) 07/31/2019    MCV 82.2 08/10/2019    MCV 80.3 08/05/2019    MCV 83.9 07/31/2019    Platelet Count 315 08/10/2019    Platelet Count 323 08/05/2019    Platelet Count 295 07/31/2019  Lab Results   Component Value Date    Tacrolimus 8.5 08/10/2019    Tacrolimus 7.3 08/05/2019    Tacrolimus 6.7 07/31/2019    Tacrolimus 5.9 06/15/2019    Tacrolimus 5.2 06/09/2019    Tacrolimus 5.0 06/04/2019          Assessment and Plan    Deceased-donor kidney transplant recipient  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  If still uptrending creatinine then will need renal allograft biopsy and DSA.  Urine protein to creatinine ratio 0.3 g/g creatinine.    Immunosuppressive management encounter following kidney transplant  cPRA 10%. Hereceived thymolite for induction  Tacrolimus9mg  in am and 10 mg in pm with goal trough 8-10ng/ml.  Continue MMF 250 mg po bid ( reduced for leucopenia)  Continue prednisone 5 mg daily.   Last FK level pending from 08/18/2019, this will be a 14 hr level.  Discussed importance of 12 hr tacrolimus level.    At risk for opportunistic infections  Continue Septra MWF until 05/19/2020 for toxo prophylaxis.    HTN (hypertension)  BP controlled on amlodipine 10 mg po daily.    Leucopenia  WBC 2.2 thousands/uL.  He is on  reduced dose cellcept.  CMV PCR with next labs.       Visit Diagnoses and Associated Orders Placed:  1. Deceased-donor kidney transplant recipient  Cytomegalovirus DNA, Quantitative PCR, Plasma    Cytomegalovirus DNA, Quantitative PCR, Plasma   2. Immunosuppressive management encounter following kidney transplant     3. At risk for opportunistic infections     4. Essential hypertension     5. Leukopenia, unspecified type  Cytomegalovirus DNA, Quantitative PCR, Plasma    Cytomegalovirus DNA, Quantitative PCR, Plasma        Nutritional assessment: Patient appears well developed and well-nourished. No need for a dietary consult at this time   Pharmacy assessment: Current medications reviewed and discussed with patient. No need for a pharmacy consult at this time.    Continue to monitor kidney function, CBC, chem-7, drug levels  weekly.  Monitor liver function, lipid panel, hemoglobin A1C, intact PTH, urine pr/cr ratio every 3-6 months.       Health Care Maintenance:   Recommend usual age- and sex-appropriate cancer screening plus annual dermatology visit for skin cancer surveillance   Recommend age and disease-appropriate vaccinations (killed vaccines only) including annual influenza vaccine. Live vaccines are contraindicated.     Risk Assessment: The patient is at increased risk of complications including drug toxicity, infection and malignancy as a result of immunosuppression for his kidney transplant and requires close monitoring of immunosuppression levels and dose adjustment to manage toxicities.   There is no evidence of immunosuppressive drug toxicity at this visit.                I spent a total of 41 minutes on this patient's care on the day of their visit excluding time spent related to any billed procedures. This time includes face-to-face time with the patient as well as time spent documenting in the medical record, reviewing patient's records and tests, obtaining history, placing orders, communicating with other healthcare professionals, counseling the patient, family, or caregiver, and/or care coordination for the diagnoses above.        I performed this evaluation using real-time telehealth tools, including a live video connection between my location and the  patient's location. Prior to initiating, I obtained informed verbal consent to perform this evaluation using telehealth tools and answered all the questions about the telehealth interaction. My location is in a [facility] clinical facility.

## 2019-08-20 LAB — PROTEIN, TOTAL, RANDOM URINE
Creatinine, Random Urine: 219 mg/dL (ref 20–320)
Prot Concentration,UR: 82 mg/dL — ABNORMAL HIGH (ref 5–25)
Protein/Creat Ratio, Urine: 374 mg/g creat — ABNORMAL HIGH (ref 22–128)
Protein/Creatinine Ratio, Rand: 0.374 mg/mg creat — ABNORMAL HIGH (ref 0.022–0.128)

## 2019-08-20 LAB — BASIC METABOLIC PANEL (NA, K,
BUN/Creatinine Ratio: 11 (calc) (ref 6–22)
Calcium, total, Serum / Plasma: 9.6 mg/dL (ref 8.6–10.3)
Carbon Dioxide, Total: 29 mmol/L (ref 20–32)
Chloride, Serum / Plasma: 105 mmol/L (ref 98–110)
Creatinine, Serum / Plasma: 1.82 mg/dL — ABNORMAL HIGH (ref 0.60–1.35)
Glucose: 78 mg/dL (ref 65–139)
Potassium, Serum / Plasma: 3.9 mmol/L (ref 3.5–5.3)
Sodium, Serum / Plasma: 141 mmol/L (ref 135–146)
Urea Nitrogen, Serum / Plasma: 20 mg/dL (ref 7–25)
eGFR - high estimate: 52 mL/min/{1.73_m2} — ABNORMAL LOW (ref >=60)
eGFR - low estimate: 44 mL/min/{1.73_m2} — ABNORMAL LOW (ref >=60)

## 2019-08-20 LAB — COMPLETE BLOOD COUNT WITH DIFF
% Basophils: 0.5 %
% Eosinophils: 0.5 %
% Monocytes: 20.8 %
% Neutrophils: 69.6 %
Abs Basophils: 11 cells/uL (ref 0–200)
Abs Eosinophils: 11 cells/uL — ABNORMAL LOW (ref 15–500)
Abs Lymphocytes: 189 cells/uL — ABNORMAL LOW (ref 850–3900)
Abs Monocytes: 458 cells/uL (ref 200–950)
Abs NRBC: 0 cells/uL
Abs Neutrophils: 1531 cells/uL (ref 1500–7800)
Hematocrit: 42.2 % (ref 38.5–50.0)
Hemoglobin: 13.4 g/dL (ref 13.2–17.1)
Lymphs: 8.6 %
MCH: 26.4 pg — ABNORMAL LOW (ref 27.0–33.0)
MCHC: 31.8 g/dL — ABNORMAL LOW (ref 32.0–36.0)
MCV: 83.2 fL (ref 80.0–100.0)
MPV: 10.5 fL (ref 7.5–12.5)
Platelet Count: 245 10*3/uL (ref 140–400)
RBC Count: 5.07 10*6/uL (ref 4.20–5.80)
RDW: 13.3 % (ref 11.0–15.0)
WBC Count: 2.2 10*3/uL — ABNORMAL LOW (ref 3.8–10.8)

## 2019-08-20 LAB — PHOSPHORUS, SERUM / PLASMA: Phosphorus, Serum / Plasma: 3.1 mg/dL (ref 2.5–4.5)

## 2019-08-20 LAB — MAGNESIUM, SERUM / PLASMA: Magnesium, Serum / Plasma: 1.8 mg/dL (ref 1.5–2.5)

## 2019-08-20 LAB — TACROLIMUS, HIGHLY SENSITIVE,: Tacrolimus: 5.9 mcg/L

## 2019-08-20 NOTE — Progress Notes (Signed)
Placed one time order for CMV online per Dr. Sandi Carne. Req # [id number]. Notified pt via MC. Notified Dr. Benancio Deeds.

## 2019-08-21 MED ORDER — TACROLIMUS 1 MG CAPSULE, IMMEDIATE-RELEASE
1 | ORAL_CAPSULE | ORAL | 11 refills | Status: DC
Start: 2019-08-21 — End: 2019-11-04

## 2019-08-21 NOTE — Telephone Encounter (Signed)
COVID-19 Hotline Patient Triage    The COVID-19 Triage Protocol was used for this encounter. It is recommended that Verneita Griffes be evaluated in the [facility] Visit. Corresponding care advice and isolation/quarantine instructions were given to the patient/caregiver.  This patient/caregiver agrees to the recommendation. Instructed patient to go to ED if s/s worsen before appointment.  Instructed patient to also please follow up with PCP too    A detailed report of the protocol criteria and care advice provided can be seen in the chart review encounter report:  Chart Review > Encounter Tab > Nurse Triage Encounter > Additional Documentation (at the bottom of the report)      []  Emergent Evaluation   ? Which ED are you going to? NA  ? If going to [facility] ED, called report to: NA    [x]  ADULT Urgent Evaluation in [facility] (includes OB)     []  ADULT Non-Urgent Evaluation (VACC)    []  ADULT Testing Only (includes OB)     COPY/PASTE MESSAGE BELOW TO [place] PCP, SPECIALTY PROVIDER, AUTHORIZING PROVIDER IN ROUTING COMMENTS:     Your patient Mr.Dimetrius Mahan has called the [place] COVID Hotline reporting COVID-19 compatible symptoms or a recent exposure. Per the [facility] Department of [facility] health order that all symptomatic patients be tested within 48 hours of reporting symptoms, we have placed an order for COVID-19 testing.     [place] [facility] has a team focused on COVID-19 test results. If the result is negative, the patient will receive an automated phone call.  If the result is positive, a nurse from [facility] will contact your patient and provide monitoring and self-care instructions. In addition, for [facility] residents, we will report household contacts to the [facility] and arrange COVID-19 testing for those who are [place] patients.      Mr.Hurschel Fowle has identified you as the [place] provider who provides the most care to him/her (this includes patients who may have an upcoming visit scheduled with you to establish care). Thus you have been listed as the authorizing provider for the COVID test. Please follow up with your patient and the test result.    Thank you,   [facility], RN  COVID Hotline Nurse Team  [place] [facility]  [phone]    Reason for Disposition   AFFIRMATIVE:  Shortness of Breath     Pt states " I have slight shortness of breath. "    Additional Information   AFFIRMATIVE:  Recent COVID Exposure     Pt states: " I got all the isolation and quarantine instruction. "   AFFIRMATIVE:  Unusual Cough     Pt states " I have wet cough. "   AFFIRMATIVE:  Sinus Congestion or Runny Nose     Pt states " I have a runny nose. "   AFFIRMATIVE:  Addominal (belly) Pain   AFFIRMATIVE:  Immunocompromised     Pt states: " I have a kidney transplant and I am on prograf. "   AFFIRMATIVE:  Comorbidities     Pt states " I have high blood pressure, heart murmur,  "  Pt states " I used inhaler for anxiety. "    Protocols used:  COVID-19 ADULT PROTOCOL

## 2019-08-21 NOTE — Progress Notes (Signed)
40M, ESRD d/t FSGS/HTN, s/p DDRT 05/20/2019. cPRA 10%     IS (per chart):  = Tacrolimus 9 mg/10 mg              -Goal 12 hour trough 8-10 ng/ml              -Current level 5.9, 08/18/18  = MMF 250 mg BID (low WBC)  = Prednisone 5 mg QD     Creatinine: 1.82, 08/18/19    Next appt: 10/08/2019 Sandi Carne, MD    MD Plan of Care:   Per Dr. Janee Morn, 14 hour level for tacrolimus was 5.9; increase dose to 10 mg BID.    RN action:   Order for increased tac pended to Dr. Janee Morn for signature. MyChart message sent to patient with instructions.

## 2019-08-21 NOTE — Telephone Encounter (Signed)
05/20/2019 (Kidney)    Issue(s):  [employer] call. Patient's son and son's gf have tested +COVID. Requesting advice.     RN action:   Called patient.    Patient's son lives with patient and has COVID symptoms. Patient states he has a runny nose, but this is not new.     RN provided CDC guidelines re: deep cleaning of home, mask-wearing when outside of rooms, lots of hand washing etc. Also provided [place] COVID hotline number [phone] and asked patient to call to request testing especially so since son has had symptoms for a few days.     Patient verbalized understanding of plan-of-care and stated he would call the hotline immediately.     Note to RN, [name] for f/u.

## 2019-08-21 NOTE — Telephone Encounter (Signed)
COVID-19 Patient Triage    1) What is the reason for your call today?  Pt exposed to son who tested positive (same household), pt declined of any sx and would like to schedule a testing     2) Were you directed to call the Coronavirus Hotline and provided with a code (word or number)? No     3) Are you a [facility] patient? Yes   Defined as: Visit with a qualifying [facility] specialist in the last year    4) Are you a [employer] Employee?  No     5) Did you complete/receive an automated [facility] Outreach Call? No    6) Did you complete the Patient Self-Triage in MyChart? No    7) Were you directed to call the Coronavirus Hotline because you did not pass a Day-of-Visit Entry Screener? No     8a)  Are you currently having severe, continuous pain or pressure in your chest (not just with coughing or breathing)? No    8b) Are you currently having shortness of breath that is unusual for you? (For example, are you having to stop and catch your breath more than usual while walking or going up stairs?)   No    8c) In the past 14 days, have you experienced any of the following new or worsening symptoms:  Yes to any of the following symptoms:  No  If yes to symptoms, when was your first day of symptoms? n/a  []  *Fever or chills   []  *Unexplained muscle aches (including joint aches for those who are concerned about symptoms related to a recent COVID vaccination)  []  *Fatigue/tiredness (only if patient has recently received COVID vaccination)  []  *Headache (only if patient has recently received COVID vaccination)  []  *Local symptoms related to COVID Vaccination (redness, swelling, pain at injection site)  []  Trouble breathing   []  Cough  []  Sinus congestion or runny nose   []  Sore throat   []  Vomiting   []  Diarrhea   []  Loss of taste or smell   []  Conjunctivitis (pink eye)     8d) If yes to only asterisked (*) symptoms above, have you received the COVID vaccine recently? No    If yes to symptoms, delete questions #9-10, skip to Disposition  and escalate to Patient Triage Team    9)  In the past 14-days have you had close contact with someone who was CONFIRMED novel coronavirus? Yes    10) In the past 10 days, have you been tested for COVID-19? No       Disposition      [x]  ADULT patient reported no symptoms, but an exposure  [x]  [facility] Patient   Includes MyChart Symptom Screener asymptomatic, exposure outcomes   Reason for Call: Exposure - Test Only   Provide quarantine instructions with .OPHCOVID19QUARANTINEINSTRUCTIONS    Complete additional questions for COVID testing order below   Do you have access to [facility] MyChart? Yes  o If Yes: "I will be escalating this to our clinical nurse triage team. They will be reaching out to you via MyChart with next steps."  o If No: Note in the documentation and patient will receive call back to schedule COVID test.   This telephone encounter was routed to Patient Triage Team at [facility] for test order only      Additional Questions for the COVID testing order:  Date of symptom/s onset?  n/a  Is this the patient's first test  for COVID-19?  No  Does the patient reside in a congregate living setting (e.g. skilled nursing facility, shelter, dorm)?  No  Is patient employed in a healthcare setting?  No  Is patient pregnant?  Not pregnant  Who provides the most care for you at [facility]?  Amarpali Brar       Home Quarantine Steps:   If you live in a household or had close contact with someone diagnosed with or awaiting test results for COVID-19, you must follow these Home Quarantine Steps to prevent passing the infection to anyone else.    Stay in home quarantine for 14 full days after you were last in close contact with the person with COVID-19.   If you are waiting for test results, follow the Home Isolation Steps (see link below) until you receive your results. If your results are negative, check with your doctor before you stop following the Home Isolation Steps.     If you are unable to  avoid close contact with the person with COVID-19, you must stay in quarantine for 14 full days after the day that person completed their self-isolation. This is likely to be at least 24 days total.      If you feel sick or are having any cold or flu symptoms, please call the [facility] COVID hotline at [phone] so that we may direct you to the appropriate care. After hours, please call your primary care provider for advice.  If you start to experience a medical emergency, please call 911 or go to your nearest emergency department.   If you are not a resident of [place], please go to the website of your local Department of Public Health for information about COVID-19 resources available in your county.      More information on Home Isolation and Home Quarantine Steps can be found on the following links:  (can be sent to patients via MyChart messaging.)    [facility] COVID-19 Quarantine/Isolation guidelines: [email or web address]    CDC website at [employer].gov    http://www.nguyen-heath.com/

## 2019-08-21 NOTE — Telephone Encounter (Signed)
COVID-19 Patient Triage    1) What is the reason for your call today? Pt called stating he started coughing with mucus about 30 mins ago and a temp of 99 and would like to know if that's a sx of corona virus. Pt already called earlier and requested to schedule a covid test since pts son tested positive but declined sx earlier.     2) Were you directed to call the Coronavirus Hotline and provided with a code (word or number)? No     3) Are you a [facility] patient? Yes   Defined as: Visit with a qualifying [facility] specialist in the last year    4) Are you a [employer] Employee?  No     5) Did you complete/receive an automated [facility] Outreach Call? No    6) Did you complete the Patient Self-Triage in MyChart? No    7) Were you directed to call the Coronavirus Hotline because you did not pass a Day-of-Visit Entry Screener? No     8a)  Are you currently having severe, continuous pain or pressure in your chest (not just with coughing or breathing)? No    8b) Are you currently having shortness of breath that is unusual for you? (For example, are you having to stop and catch your breath more than usual while walking or going up stairs?)   No    8c) In the past 14 days, have you experienced any of the following new or worsening symptoms:  Yes to any of the following symptoms:  Yes  If yes to symptoms, when was your first day of symptoms? 08/21/2019  []  *Fever or chills   []  *Unexplained muscle aches (including joint aches for those who are concerned about symptoms related to a recent COVID vaccination)  []  *Fatigue/tiredness (only if patient has recently received COVID vaccination)  []  *Headache (only if patient has recently received COVID vaccination)  []  *Local symptoms related to COVID Vaccination (redness, swelling, pain at injection site)  []  Trouble breathing   [x]  Cough (with mucus)   []  Sinus congestion or runny nose   []  Sore throat   []  Vomiting   []  Diarrhea   []  Loss of taste or smell   []  Conjunctivitis (pink eye)      8d) If yes to only asterisked (*) symptoms above, have you received the COVID vaccine recently? No      If yes to symptoms, delete questions #9-10, skip to Disposition and escalate to Patient Triage Team       Disposition  [x]  ADULT or PEDIATRIC patient reported symptoms or the [facility] Essential Caregiver/COVID Positive Adult/COVID Positive Pediatric code   Use corresponding Reason for Call option: COVID-19 Patient Triage, [facility]   For [facility], cc: in APEX to coordinate billing   This telephone encounter was routed to Patient Triage Team at [facility] COVID POP HEALTH HOTLINE ESCALATIONS for further evaluation

## 2019-08-21 NOTE — Telephone Encounter (Signed)
This adult patient reported an exposure to a person with confirmed COVID-19 infection, is asymptomatic, and is appropriate for testing.  The Population Health COVID Hotline team has entered the order to facilitate this process.      MESSAGE TO [facility] PCP, SPECIALTY PROVIDER, AUTHORIZING PROVIDER:     Your patient Cody Myers has called the [facility] COVID Hotline reporting COVID-19 compatible symptoms and/or a recent exposure. Per the [facility] Department of Public Health health order that all symptomatic patients or patients who are considered a close contact to a confirmed case of COVID-19 be tested within 48 hours, we have placed an order for COVID-19 testing.     [facility] has a team focused on COVID-19 test results. If the result is negative, the patient will receive an automated phone call.  If the result is positive, a nurse from [facility] will contact your patient and provide monitoring and self-care instructions. In addition, for [facility] residents, we will report household contacts to the [facility] and arrange COVID-19 testing for those who are [facility] patients.     [name] has identified you as the [place] [facility] provider who provides the most care to him/her (this includes patients who may have an upcoming visit scheduled with you to establish care). Thus you have been listed as the authorizing provider for the COVID test. Please follow up with your patient and the test result.    Thank you,   Valaria Good, RN  COVID Hotline Nurse Team  [facility] Population [facility]  [phone]

## 2019-08-22 LAB — COVID-19, RNA, RT-PCR/NAA - EX: COVID-19, RNA, RT-PCR/NAA - EX: DETECTED — AB

## 2019-08-22 NOTE — Telephone Encounter (Signed)
Patient called back after message to schedule [facility].     Patient unable to come to [facility] from [place] and stated he would be heading to local hospital today.     Patient also wanted to know whether to call into hotline with testing results. Advised to call in results once available.

## 2019-08-22 NOTE — Progress Notes (Signed)
Patient called the answering service.     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     Runny nose, mild cough with small amount of sputum.  No fever/headache/diarrhea/SOB.     Had a COVID test positive today.  He asks if ok to have "antibody therapy" offered by local hospital.       Current IS: MMF 250 BID, tac, pred.     Advised that recommend antibody therapy (he is not sure of name but suspect bamlanivimab).   Advised hold MMF until recovered from COVID infection and then restart at 250 BID.     Leodis Liverpool  MB Carlyle Basques, [place]  Transplant Nephrology Fellow  08/22/19

## 2019-08-22 NOTE — Telephone Encounter (Signed)
Scheduling covid test     1x lm re sched     Ana [phone]

## 2019-08-23 NOTE — Progress Notes (Signed)
COVID+ at OSH 08/22/19; updated in APEX External Results and notified [facility].

## 2019-08-26 NOTE — Telephone Encounter (Signed)
T/C to pt, LVM generally letting him know will need to wait to go back to lab, and will send him a MyChart message about this as well.

## 2019-08-26 NOTE — Telephone Encounter (Signed)
LogEvent: [id number] CASFMS  Call #: [phone]  Time Sent: 08/25/2019 18:24 MT    Caller Name: Darrly Loberg  Tel: [phone] Ext:   From: [facility]    Message Type: General Message  Message: Pt. Name:  Cody Myers  DOB:  29-Aug-1975  [facility]  You are seen at [facility] in [place], correct?  Yes Date of transplant (if applicable):  05/21/2019  RE:  Supposed to have blood drawn weekly, but pt has COVID and he needs to know what to do.  Please call and advise.  Urgent or Non-Urgent.   Callback number:  [phone]    For Organization: [place]- [facility] (Business Hours)  Name:   Role:       CONFIDENTIAL AND PROP[employer]ET[employer]    LogEvent: [id number]

## 2019-09-07 NOTE — Telephone Encounter (Signed)
Received call from patient who needs medical records (not sure which) sent to:    [facility] [place], [place] - [address]   [address] [place] [address]   Phone:  [phone] Fax:  [phone]    Note to current [facility] and [place] to call the pharmacy to determine to which records they refer.

## 2019-09-08 NOTE — Telephone Encounter (Signed)
T/C to pharmacy  09/08/19, 5:05 PM. Staff with info on records needed had just left. Provided direct # to call us back with info tomorrow morning.    Received return call from Rosalinda in billing dept on 09/09/19. Needs transplant date and surgeon name from the hospital for Medicare-related billing.    [facility]  PH: [phone] #4, DME dept.  Valinda Hoar [phone]     Electronically routed DC [facility] 05/26/19 via APEX to pharmacy.

## 2019-09-18 LAB — COMPLETE BLOOD COUNT WITH DIFF
% Basophils: 0.7 %
% Eosinophils: 1 %
% Monocytes: 18.3 %
% Neutrophils: 70.6 %
Abs Basophils: 29 cells/uL (ref 0–200)
Abs Eosinophils: 42 cells/uL (ref 15–500)
Abs Lymphocytes: 395 cells/uL — ABNORMAL LOW (ref 850–3900)
Abs Monocytes: 769 cells/uL (ref 200–950)
Abs NRBC: 0 cells/uL
Abs Neutrophils: 2965 cells/uL (ref 1500–7800)
Hematocrit: 46.4 % (ref 38.5–50.0)
Hemoglobin: 14.6 g/dL (ref 13.2–17.1)
Lymphs: 9.4 %
MCH: 26 pg — ABNORMAL LOW (ref 27.0–33.0)
MCHC: 31.5 g/dL — ABNORMAL LOW (ref 32.0–36.0)
MCV: 82.6 fL (ref 80.0–100.0)
MPV: 10.9 fL (ref 7.5–12.5)
Platelet Count: 312 10*3/uL (ref 140–400)
RBC Count: 5.62 10*6/uL (ref 4.20–5.80)
RDW: 13.1 % (ref 11.0–15.0)
WBC Count: 4.2 10*3/uL (ref 3.8–10.8)

## 2019-09-18 LAB — MAGNESIUM, SERUM / PLASMA: Magnesium, Serum / Plasma: 1.6 mg/dL (ref 1.5–2.5)

## 2019-09-18 LAB — PHOSPHORUS, SERUM / PLASMA: Phosphorus, Serum / Plasma: 3.4 mg/dL (ref 2.5–4.5)

## 2019-09-18 LAB — BASIC METABOLIC PANEL (NA, K,
BUN/Creatinine Ratio: 13 (calc) (ref 6–22)
Calcium, total, Serum / Plasma: 9.7 mg/dL (ref 8.6–10.3)
Carbon Dioxide, Total: 29 mmol/L (ref 20–32)
Chloride, Serum / Plasma: 104 mmol/L (ref 98–110)
Creatinine, Serum / Plasma: 1.68 mg/dL — ABNORMAL HIGH (ref 0.60–1.35)
Glucose: 80 mg/dL (ref 65–99)
Potassium, Serum / Plasma: 4 mmol/L (ref 3.5–5.3)
Sodium, Serum / Plasma: 140 mmol/L (ref 135–146)
Urea Nitrogen, Serum / Plasma: 21 mg/dL (ref 7–25)
eGFR - high estimate: 57 mL/min/{1.73_m2} — ABNORMAL LOW (ref >=60)
eGFR - low estimate: 49 mL/min/{1.73_m2} — ABNORMAL LOW (ref >=60)

## 2019-09-18 LAB — BK VIRUS, DNA, QUANTITATIVE, P: BK Virus DNA: NOT DETECTED copies/mL

## 2019-09-18 LAB — TACROLIMUS, HIGHLY SENSITIVE,: Tacrolimus: 10.7 mcg/L

## 2019-09-21 NOTE — Progress Notes (Signed)
Patient has consent to participate in the OKRA study.   AlloSure was ordered on 09/16/19 as per protocol for routine monitoring. The Allosure result was 0.18%. Based on the AlloSure result and other clinical information, the patient was informed of the following management plan: routine clinic follow up and labs per guideline

## 2019-09-22 LAB — PROTEIN, TOTAL, RANDOM URINE
Creatinine, Random Urine: 477 mg/dL — ABNORMAL HIGH (ref 20–320)
Prot Concentration,UR: 69 mg/dL — ABNORMAL HIGH (ref 5–25)
Protein/Creat Ratio, Urine: 145 mg/g creat — ABNORMAL HIGH (ref 22–128)
Protein/Creatinine Ratio, Rand: 0.145 mg/mg creat — ABNORMAL HIGH (ref 0.022–0.128)

## 2019-09-23 NOTE — Telephone Encounter (Signed)
LogEvent: [id number] CASFMS  Call #: [phone]  Time Sent: 09/23/2019 07:35 MT    Caller Name: Audwin Semper  Tel: [phone] Ext:   From: [facility]     Message Type: General Message  Message: Pt. Name: Cody Myers  DOB: October 09, 1975  post kidney  You are seen at [facility] in [place], correct? Yes Date of transplant (if applicable): 05/21/19  RE: Pt. is done w/ covid quarantine.  Would like to know if he should start taking his mycophenolate again. Would also like to discuss his kidney function.  He isn't sure how to check it in Mychart.   Non-Urgent.   Callback number: [phone]    For Organization: [place]- [facility] (Business Hours)  Name:   Role:       CONFIDENTIAL AND PROPRIETARY[employer]    LogEvent: [id number]

## 2019-09-23 NOTE — Progress Notes (Signed)
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 cPRA 10%     IS (per chart):  = Tacrolimus 10 mg/10 mg              -Goal 12 hour trough 8-10 ng/ml              -Current level 6.1, 09/22/19; 10.7, 09/16/19  = Prednisone 5 mg QD   = Off MMF (COVID); was on 250 mg BID (low WBC)    Creatinine: 1.70, 09/22/19    Next appt: 10/08/2019 Sandi Carne, MD. Pt aware.    Issue:  Vmail from pt re: labs and resuming MMF.    RN action:  T/C to patient 09/23/19, 9:05 AM.  Just had runny nose, cough, HA with COVID. Symptoms entirely resolved and >20 days since positive test. Per Dr. Barnetta Chapel 08/22/19, pt to resume MMF 250 mg BID when COVID resolved. He will restart today. Discussed kidney function and other labs. Trying to stay hydrated. Labs yesterday: will be good 12 hr level for tac on 10/10. Reports he accidentally took med before last lab (so 10.7 level, a little above goal range, was not accurate).  Let him know will contact him if any issues with tac.  Patient verbalized understanding and had no further questions.    Tac back, below goal at 6.1. Sent to Dr. Michaelle Birks for review.    MD Plan of Care:   Per Dr. Michaelle Birks, agree resume 250 mg BID MMF now (pt was instructed yesterday). No change to tac dose.    RN action:   Will continue to follow labs in [facility] kidney transplant logs.

## 2019-09-25 LAB — BASIC METABOLIC PANEL (NA, K,
BUN/Creatinine Ratio: 12 (calc) (ref 6–22)
Calcium, total, Serum / Plasma: 9.7 mg/dL (ref 8.6–10.3)
Carbon Dioxide, Total: 24 mmol/L (ref 20–32)
Chloride, Serum / Plasma: 105 mmol/L (ref 98–110)
Creatinine, Serum / Plasma: 1.7 mg/dL — ABNORMAL HIGH (ref 0.60–1.35)
Glucose: 85 mg/dL (ref 65–99)
Potassium, Serum / Plasma: 3.6 mmol/L (ref 3.5–5.3)
Sodium, Serum / Plasma: 140 mmol/L (ref 135–146)
Urea Nitrogen, Serum / Plasma: 20 mg/dL (ref 7–25)
eGFR - high estimate: 56 mL/min/{1.73_m2} — ABNORMAL LOW (ref >=60)
eGFR - low estimate: 48 mL/min/{1.73_m2} — ABNORMAL LOW (ref >=60)

## 2019-09-25 LAB — COMPLETE BLOOD COUNT WITH DIFF
% Basophils: 0.4 %
% Eosinophils: 0.6 %
% Monocytes: 14.5 %
% Neutrophils: 76 %
Abs Basophils: 19 cells/uL (ref 0–200)
Abs Eosinophils: 28 cells/uL (ref 15–500)
Abs Lymphocytes: 400 cells/uL — ABNORMAL LOW (ref 850–3900)
Abs Monocytes: 682 cells/uL (ref 200–950)
Abs Neutrophils: 3572 cells/uL (ref 1500–7800)
Hematocrit: 47.8 % (ref 38.5–50.0)
Hemoglobin: 14.9 g/dL (ref 13.2–17.1)
Lymphs: 8.5 %
MCH: 25.3 pg — ABNORMAL LOW (ref 27.0–33.0)
MCHC: 31.2 g/dL — ABNORMAL LOW (ref 32.0–36.0)
MCV: 81.2 fL (ref 80.0–100.0)
MPV: 10.4 fL (ref 7.5–12.5)
Platelet Count: 266 10*3/uL (ref 140–400)
RBC Count: 5.89 10*6/uL — ABNORMAL HIGH (ref 4.20–5.80)
RDW: 13.3 % (ref 11.0–15.0)
WBC Count: 4.7 10*3/uL (ref 3.8–10.8)

## 2019-09-25 LAB — MAGNESIUM, SERUM / PLASMA: Magnesium, Serum / Plasma: 1.6 mg/dL (ref 1.5–2.5)

## 2019-09-25 LAB — PHOSPHORUS, SERUM / PLASMA: Phosphorus, Serum / Plasma: 4.2 mg/dL (ref 2.5–4.5)

## 2019-09-25 LAB — PROTEIN, TOTAL, RANDOM URINE
Creatinine, Random Urine: 265 mg/dL (ref 20–320)
Prot Concentration,UR: 61 mg/dL — ABNORMAL HIGH (ref 5–25)
Protein/Creat Ratio, Urine: 230 mg/g creat — ABNORMAL HIGH (ref 22–128)
Protein/Creatinine Ratio, Rand: 0.23 mg/mg creat — ABNORMAL HIGH (ref 0.022–0.128)

## 2019-09-25 LAB — TACROLIMUS, HIGHLY SENSITIVE,: Tacrolimus: 6.1 mcg/L

## 2019-10-01 LAB — COMPLETE BLOOD COUNT WITH DIFF
% Basophils: 1 %
% Eosinophils: 1.3 %
% Monocytes: 17.8 %
% Neutrophils: 68.3 %
Abs Basophils: 30 cells/uL (ref 0–200)
Abs Eosinophils: 39 cells/uL (ref 15–500)
Abs Lymphocytes: 348 cells/uL — ABNORMAL LOW (ref 850–3900)
Abs Monocytes: 534 cells/uL (ref 200–950)
Abs NRBC: 0 cells/uL
Abs Neutrophils: 2049 cells/uL (ref 1500–7800)
Hematocrit: 51 % — ABNORMAL HIGH (ref 38.5–50.0)
Hemoglobin: 16.3 g/dL (ref 13.2–17.1)
Lymphs: 11.6 %
MCH: 25.8 pg — ABNORMAL LOW (ref 27.0–33.0)
MCHC: 32 g/dL (ref 32.0–36.0)
MCV: 80.7 fL (ref 80.0–100.0)
MPV: 10.5 fL (ref 7.5–12.5)
Platelet Count: 287 10*3/uL (ref 140–400)
RBC Count: 6.32 10*6/uL — ABNORMAL HIGH (ref 4.20–5.80)
RDW: 13.5 % (ref 11.0–15.0)
WBC Count: 3 10*3/uL — ABNORMAL LOW (ref 3.8–10.8)

## 2019-10-01 LAB — BASIC METABOLIC PANEL (NA, K,
BUN/Creatinine Ratio: 15 (calc) (ref 6–22)
Calcium, total, Serum / Plasma: 10.2 mg/dL (ref 8.6–10.3)
Carbon Dioxide, Total: 25 mmol/L (ref 20–32)
Chloride, Serum / Plasma: 106 mmol/L (ref 98–110)
Creatinine, Serum / Plasma: 1.49 mg/dL — ABNORMAL HIGH (ref 0.60–1.35)
Glucose: 86 mg/dL (ref 65–99)
Potassium, Serum / Plasma: 3.8 mmol/L (ref 3.5–5.3)
Sodium, Serum / Plasma: 141 mmol/L (ref 135–146)
Urea Nitrogen, Serum / Plasma: 22 mg/dL (ref 7–25)
eGFR - high estimate: 66 mL/min/{1.73_m2} (ref >=60)
eGFR - low estimate: 57 mL/min/{1.73_m2} — ABNORMAL LOW (ref >=60)

## 2019-10-01 LAB — PROTEIN, TOTAL, RANDOM URINE
Creatinine, Random Urine: 130 mg/dL (ref 20–320)
Prot Concentration,UR: 38 mg/dL — ABNORMAL HIGH (ref 5–25)
Protein/Creat Ratio, Urine: 292 mg/g creat — ABNORMAL HIGH (ref 22–128)
Protein/Creatinine Ratio, Rand: 0.292 mg/mg creat — ABNORMAL HIGH (ref 0.022–0.128)

## 2019-10-01 LAB — PHOSPHORUS, SERUM / PLASMA: Phosphorus, Serum / Plasma: 3.5 mg/dL (ref 2.5–4.5)

## 2019-10-01 LAB — MAGNESIUM, SERUM / PLASMA: Magnesium, Serum / Plasma: 1.7 mg/dL (ref 1.5–2.5)

## 2019-10-01 LAB — TACROLIMUS, HIGHLY SENSITIVE,: Tacrolimus: 7.9 mcg/L

## 2019-10-07 DIAGNOSIS — U071 COVID-19: Secondary | ICD-10-CM

## 2019-10-07 NOTE — Assessment & Plan Note (Deleted)
Needs Toxo prophylaxis with Septra DS MWF x 12 months, until 05/19/20

## 2019-10-07 NOTE — Assessment & Plan Note (Deleted)
COVID + on 08/22/2019. Now feels better.

## 2019-10-07 NOTE — Assessment & Plan Note (Deleted)
BP controlled onamlodipine 10mg  po daily.  Dietary changes advised.

## 2019-10-07 NOTE — Assessment & Plan Note (Deleted)
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

## 2019-10-08 ENCOUNTER — Ambulatory Visit: Admit: 2019-10-08 | Discharge: 2019-10-08 | Payer: MEDICARE | Attending: Nephrology

## 2019-10-09 LAB — COMPLETE BLOOD COUNT WITH DIFF
% Basophils: 1.1 %
% Eosinophils: 1.1 %
% Monocytes: 18.3 %
% Neutrophils: 64.1 %
Abs Basophils: 30 cells/uL (ref 0–200)
Abs Eosinophils: 30 cells/uL (ref 15–500)
Abs Lymphocytes: 416 cells/uL — ABNORMAL LOW (ref 850–3900)
Abs Monocytes: 494 cells/uL (ref 200–950)
Abs NRBC: 0 cells/uL
Abs Neutrophils: 1731 cells/uL (ref 1500–7800)
Hematocrit: 47.1 % (ref 38.5–50.0)
Hemoglobin: 15.5 g/dL (ref 13.2–17.1)
Lymphs: 15.4 %
MCH: 26.1 pg — ABNORMAL LOW (ref 27.0–33.0)
MCHC: 32.9 g/dL (ref 32.0–36.0)
MCV: 79.3 fL — ABNORMAL LOW (ref 80.0–100.0)
MPV: 10.9 fL (ref 7.5–12.5)
Platelet Count: 292 10*3/uL (ref 140–400)
RBC Count: 5.94 10*6/uL — ABNORMAL HIGH (ref 4.20–5.80)
RDW: 13.2 % (ref 11.0–15.0)
WBC Count: 2.7 10*3/uL — ABNORMAL LOW (ref 3.8–10.8)

## 2019-10-09 LAB — PHOSPHORUS, SERUM / PLASMA: Phosphorus, Serum / Plasma: 3.9 mg/dL (ref 2.5–4.5)

## 2019-10-09 LAB — MAGNESIUM, SERUM / PLASMA: Magnesium, Serum / Plasma: 2 mg/dL (ref 1.5–2.5)

## 2019-10-09 LAB — BASIC METABOLIC PANEL (NA, K,
BUN/Creatinine Ratio: 16 (calc) (ref 6–22)
Calcium, total, Serum / Plasma: 10.2 mg/dL (ref 8.6–10.3)
Carbon Dioxide, Total: 28 mmol/L (ref 20–32)
Chloride, Serum / Plasma: 103 mmol/L (ref 98–110)
Creatinine, Serum / Plasma: 1.53 mg/dL — ABNORMAL HIGH (ref 0.60–1.35)
Glucose: 83 mg/dL (ref 65–99)
Potassium, Serum / Plasma: 3.9 mmol/L (ref 3.5–5.3)
Sodium, Serum / Plasma: 140 mmol/L (ref 135–146)
Urea Nitrogen, Serum / Plasma: 25 mg/dL (ref 7–25)
eGFR - high estimate: 64 mL/min/{1.73_m2} (ref >=60)
eGFR - low estimate: 55 mL/min/{1.73_m2} — ABNORMAL LOW (ref >=60)

## 2019-10-09 LAB — PROTEIN, TOTAL, RANDOM URINE
Creatinine, Random Urine: 212 mg/dL (ref 20–320)
Prot Concentration,UR: 52 mg/dL — ABNORMAL HIGH (ref 5–25)
Protein/Creat Ratio, Urine: 245 mg/g creat — ABNORMAL HIGH (ref 22–128)
Protein/Creatinine Ratio, Rand: 0.245 mg/mg creat — ABNORMAL HIGH (ref 0.022–0.128)

## 2019-10-09 LAB — TACROLIMUS, HIGHLY SENSITIVE,: Tacrolimus: 31 mcg/L — ABNORMAL HIGH

## 2019-10-09 NOTE — Progress Notes (Signed)
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 cPRA 10%     IS (per chart):  = Tacrolimus 10 mg/10 mg              -Goal 12 hour trough 8-10 ng/ml              -Current level 31.0, 10/07/19; 7.9, 09/30/19   = Mycophenolate 250 mg BID (low WBC)  = Prednisone 5 mg QD     Creatinine: 1.53, 10/07/19    Next [facility] appt:  TBD after no show 10/08/19    Issues:  Tac very high.    RN action:  T/C to patient 10/09/19, 4:25 PM. Pt accidentally took tac before lab; meant to leave it out of his morning meds but didn't. He'll work on this with his son. Verified dose 10/10. Also spoke with caregiver/son [name] who noted that his father has trouble accessing Zoom; [name] recently started working again and wasn't able to help him for the VV yesterday (no show). Let him know will reschedule and if he's unable to help, could switch VV to telephone if absolutely necessary. Pt will repeat labs next week with good 12 hour level including no med before lab. Patient and son verbalized understanding and had no further questions.    Request sent to front desk team to reschedule pt for visit. Will continue to follow labs in [facility] kidney transplant logs.

## 2019-10-14 MED ORDER — AMLODIPINE 10 MG TABLET
10 | ORAL | 5 refills | 90.00000 days | Status: DC
Start: 2019-10-14 — End: 2020-05-17

## 2019-10-16 LAB — BASIC METABOLIC PANEL (NA, K,
BUN/Creatinine Ratio: 15 (calc) (ref 6–22)
Calcium, total, Serum / Plasma: 10.1 mg/dL (ref 8.6–10.3)
Carbon Dioxide, Total: 22 mmol/L (ref 20–32)
Chloride, Serum / Plasma: 108 mmol/L (ref 98–110)
Creatinine, Serum / Plasma: 1.57 mg/dL — ABNORMAL HIGH (ref 0.60–1.35)
Glucose: 125 mg/dL (ref 65–139)
Potassium, Serum / Plasma: 3.5 mmol/L (ref 3.5–5.3)
Sodium, Serum / Plasma: 143 mmol/L (ref 135–146)
Urea Nitrogen, Serum / Plasma: 23 mg/dL (ref 7–25)
eGFR - high estimate: 62 mL/min/{1.73_m2} (ref >=60)
eGFR - low estimate: 53 mL/min/{1.73_m2} — ABNORMAL LOW (ref >=60)

## 2019-10-16 LAB — COMPLETE BLOOD COUNT WITH DIFF
% Basophils: 1.1 %
% Eosinophils: 0.8 %
% Monocytes: 16.6 %
% Neutrophils: 65.3 %
Abs Basophils: 30 cells/uL (ref 0–200)
Abs Eosinophils: 22 cells/uL (ref 15–500)
Abs Lymphocytes: 437 cells/uL — ABNORMAL LOW (ref 850–3900)
Abs Monocytes: 448 cells/uL (ref 200–950)
Abs NRBC: 0 cells/uL
Abs Neutrophils: 1763 cells/uL (ref 1500–7800)
Hematocrit: 51 % — ABNORMAL HIGH (ref 38.5–50.0)
Hemoglobin: 16.9 g/dL (ref 13.2–17.1)
Lymphs: 16.2 %
MCH: 26 pg — ABNORMAL LOW (ref 27.0–33.0)
MCHC: 33.1 g/dL (ref 32.0–36.0)
MCV: 78.5 fL — ABNORMAL LOW (ref 80.0–100.0)
MPV: 10.5 fL (ref 7.5–12.5)
Platelet Count: 303 10*3/uL (ref 140–400)
RBC Count: 6.5 10*6/uL — ABNORMAL HIGH (ref 4.20–5.80)
RDW: 14.3 % (ref 11.0–15.0)
WBC Count: 2.7 10*3/uL — ABNORMAL LOW (ref 3.8–10.8)

## 2019-10-16 LAB — MAGNESIUM, SERUM / PLASMA: Magnesium, Serum / Plasma: 1.7 mg/dL (ref 1.5–2.5)

## 2019-10-16 LAB — PROTEIN, TOTAL, RANDOM URINE
Creatinine, Random Urine: 88 mg/dL (ref 20–320)
Prot Concentration,UR: 20 mg/dL (ref 5–25)
Protein/Creat Ratio, Urine: 227 mg/g creat — ABNORMAL HIGH (ref 22–128)
Protein/Creatinine Ratio, Rand: 0.227 mg/mg creat — ABNORMAL HIGH (ref 0.022–0.128)

## 2019-10-16 LAB — PHOSPHORUS, SERUM / PLASMA: Phosphorus, Serum / Plasma: 3.1 mg/dL (ref 2.5–4.5)

## 2019-10-16 LAB — BK VIRUS, DNA, QUANTITATIVE, P: BK Virus DNA: NOT DETECTED copies/mL

## 2019-10-16 LAB — TACROLIMUS, HIGHLY SENSITIVE,: Tacrolimus: 8.3 mcg/L

## 2019-10-21 LAB — COMPLETE BLOOD COUNT WITH DIFF
% Basophils: 0.6 %
% Eosinophils: 0.6 %
% Monocytes: 15.8 %
% Neutrophils: 73 %
Abs Basophils: 19 cells/uL (ref 0–200)
Abs Eosinophils: 19 cells/uL (ref 15–500)
Abs Lymphocytes: 310 cells/uL — ABNORMAL LOW (ref 850–3900)
Abs Monocytes: 490 cells/uL (ref 200–950)
Abs NRBC: 0 cells/uL
Abs Neutrophils: 2263 cells/uL (ref 1500–7800)
Hematocrit: 50.5 % — ABNORMAL HIGH (ref 38.5–50.0)
Hemoglobin: 16.3 g/dL (ref 13.2–17.1)
Lymphs: 10 %
MCH: 25.5 pg — ABNORMAL LOW (ref 27.0–33.0)
MCHC: 32.3 g/dL (ref 32.0–36.0)
MCV: 79 fL — ABNORMAL LOW (ref 80.0–100.0)
MPV: 10.9 fL (ref 7.5–12.5)
Platelet Count: 266 10*3/uL (ref 140–400)
RBC Count: 6.39 10*6/uL — ABNORMAL HIGH (ref 4.20–5.80)
RDW: 14.3 % (ref 11.0–15.0)
WBC Count: 3.1 10*3/uL — ABNORMAL LOW (ref 3.8–10.8)

## 2019-10-21 LAB — PROTEIN, TOTAL, RANDOM URINE
Creatinine, Random Urine: 242 mg/dL (ref 20–320)
Prot Concentration,UR: 61 mg/dL — ABNORMAL HIGH (ref 5–25)
Protein/Creat Ratio, Urine: 252 mg/g creat — ABNORMAL HIGH (ref 22–128)
Protein/Creatinine Ratio, Rand: 0.252 mg/mg creat — ABNORMAL HIGH (ref 0.022–0.128)

## 2019-10-21 LAB — BASIC METABOLIC PANEL (NA, K,
BUN/Creatinine Ratio: 15 (calc) (ref 6–22)
Calcium, total, Serum / Plasma: 10 mg/dL (ref 8.6–10.3)
Carbon Dioxide, Total: 25 mmol/L (ref 20–32)
Chloride, Serum / Plasma: 108 mmol/L (ref 98–110)
Creatinine, Serum / Plasma: 1.65 mg/dL — ABNORMAL HIGH (ref 0.60–1.35)
Glucose: 95 mg/dL (ref 65–139)
Potassium, Serum / Plasma: 3.5 mmol/L (ref 3.5–5.3)
Sodium, Serum / Plasma: 143 mmol/L (ref 135–146)
Urea Nitrogen, Serum / Plasma: 24 mg/dL (ref 7–25)
eGFR - high estimate: 58 mL/min/{1.73_m2} — ABNORMAL LOW (ref >=60)
eGFR - low estimate: 50 mL/min/{1.73_m2} — ABNORMAL LOW (ref >=60)

## 2019-10-21 LAB — MAGNESIUM, SERUM / PLASMA: Magnesium, Serum / Plasma: 1.7 mg/dL (ref 1.5–2.5)

## 2019-10-21 LAB — PHOSPHORUS, SERUM / PLASMA: Phosphorus, Serum / Plasma: 3.7 mg/dL (ref 2.5–4.5)

## 2019-10-21 LAB — TACROLIMUS, HIGHLY SENSITIVE,: Tacrolimus: 7 mcg/L

## 2019-10-21 NOTE — Progress Notes (Signed)
Patient has consent to participate in the OKRA study.   AlloSure was ordered on 10/16/19 as per protocol for routine monitoring. The Allosure result was 0.25%. Based on the AlloSure result and other clinical information, the patient was informed of the following management plan: routine clinic follow up and labs per guideline

## 2019-10-26 ENCOUNTER — Ambulatory Visit: Admit: 2019-10-26 | Discharge: 2019-11-13 | Payer: MEDICARE | Attending: Nephrology | Primary: Physician

## 2019-10-26 DIAGNOSIS — D72819 Decreased white blood cell count, unspecified: Secondary | ICD-10-CM

## 2019-10-26 NOTE — Progress Notes (Signed)
POST-KIDNEY TRANSPLANT NEPHROLOGY PROGRESS NOTE         Subjective      Chief Complaint   Patient presents with    Kidney Transplant Follow-up       History of Present Illness:  [name] is a 44 y.o. year old male S/P kidney transplant on 05/20/2019 being seen for a follow-up visit. The patient feels well and has no new complaints.       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 Acute tubular epithelia injury; see comment.   2. No evidence of rejection, negative C4d stain.   2. No significant tubulointerstitial chronicity.   3. Moderate arteriosclerosis, donor-derived.     Special Stain Summary: (x8) PAS and trichrome special stains were  performed and examined to confirm the diagnosis. The findings are  consistent with our H&E-based light microscopic impression. Six  additional PAS-stained level sections were evaluated, and no segmentally  sclerotic glomeruli are identified.     DSAs not done      Immunosuppressive management encounter following kidney transplant 05/21/2019     Induction agent/dose: Thymo lite  Regime: Tac/MMF  Corticosteroid plan: maintenance   cPRA: 10%      Transplant follow-up 05/21/2019     Category Problem Type of follow-up needed in clinic?  Ordered? Scheduled? Comments   Nursing (e.g., drain/ Foley) HD tunneled line refer to IR for removal when appropriate Needs to be done in clinic weekly dressing changes in HD clinic until can be removed   Lab/ Micro Standard Tac Follow up results   Needs to be ordered None   Imaging studies/ procedures NA NA NA None   Referrals Uroglogy for stent removal NA Already ordered None   Delayed graft function and improving UOP, monitor for resolution        Ureteral stent retained of kidney transplant (CMS code) 05/21/2019     Implant Name Type Inv. Item Serial No. Manufacturer Lot No. LRB No. Used Action   STENT URETHRAL 6FR X 16CM DOUBLE J   [id number] - [id number] Stents-Non Des STENT URETHRAL 6FR X 16CM DOUBLE J   [id number]   [id number] Right 1 Implanted     Referral placed for outpatient stent removal    07/01/19 stent removed.       At risk for opportunistic infections 05/21/2019     Transplant Serology Workup:  Recipient        Lab Results   Component Value Date    HBCAB NEG 12/17/2018    HBSAG NEG 12/17/2018    HBABQ >800 12/17/2018    HCV NEG 12/17/2018    RPR Nonreactive 12/17/2018    CMVAB POS (A) 12/17/2018  VCAM NEG 12/17/2018    EBNA POS (A) 12/17/2018    TOXO NEG 12/17/2018         Donor        Cadaver Donor UNOS ID (no units)   Date Value   05/20/2019 [id number]     Match ID: [id number]          Induction Plan:   Thymo + steroid maintenance     Plan for PPX:   Toxo prophylaxis: (D +/R - Toxo MISMATCH), Septra DS 1 tab PO Daily x 1 month (06/19/2019), then qMWF x 11 months more (05/19/2020) -- 1 year total for Toxo Mismatch   PCP prophylaxis: Septra DS as per above for Toxo ppx   CMV prophylaxis: (D +/R +), Valcyte 900 mg PO Daily (adjusted for renal function) x 3 months (08/18/2019)   Fungal prophylaxis: Fluconazole 100 mg PO q7days x 1 month (06/18/2019)  HBV prophylaxis: Not necessary            At risk of infection transmitted from donor 05/21/2019     Patient underwent transplant with no known donor  derived risk factors: Donor is not CDC high risk, not HBV core ab + and there are no known donor derived infections. No need for treatment or monitoring beyond the usual opportunistic infection prophylaxis per protocol. Donor with no known malignancies.  The donor's CMV status is IgG positive and the recipient's CMV status is IgG positive.  Prophylaxis will be determined by risk of CMV transmission.                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     Dialysis Type: HD Hemodialysis  Dialysis Center: [facility]         Phone: [phone]        Fax: [phone]       Address: 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  Been on HD since 2010.  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   Medication Sig    acetaminophen (TYLENOL) 500 mg tablet Take 2 tablets (1,000 mg total) by mouth every 8 (eight) hours as needed for Pain (mild pain) Max = 3000 mg/day    albuterol 90 mcg/actuation metered dose inhaler Inhale 1-2 puffs into the lungs every 6 (six) hours as needed for Wheezing or Shortness of Breath       amLODIPine (NORVASC) 10 mg tablet TAKE 1 TABLET(10 MG) BY MOUTH DAILY    docusate sodium (COLACE) 250 mg capsule Take 1 capsule (250 mg total) by mouth Twice a day Hold for loose stools    mycophenolate (CELLCEPT) 250 mg capsule 07/21/19:  Take CellCept 1 pill (250 mg) in  the AM and 1 pill (250 mg) in the PM,  "or as directed"    predniSONE (DELTASONE) 5 mg tablet Take 1 tablet (5 mg total) by mouth daily Starting 06/17/19 onwards.    senna (SENOKOT) 8.6 mg tablet Take 2 tablets (17.2 mg total) by mouth nightly as needed for Constipation    sulfamethoxazole-trimethoprim (SEPTRA DS) 800-160 mg tablet Take 1 tablet by mouth daily through 06/19/2019, then 1 tablet every Monday, Wednesday, and Friday Stop after 05/19/2020    tacrolimus (PROGRAF) 1 mg capsule 08/21/19: Take 10 pills (10 mg) in the AM and 10 pills (10 mg) in the PM. Or as directed.             Objective          Review of Prior Testing  I personally reviewed data as noted below including the following:    1. Metabolic panel  2. Complete blood count  3. Urine chemistry including level of proteinuria  4. Level of immunosuppressive drug (tacrolimus).          Lab Results   Component Value Date    Creatinine 2.12 (H) 06/15/2019    Creatinine 2.52 (H) 06/09/2019    Creatinine 3.74 (H) 06/04/2019    Creatinine, Serum / Plasma 1.65 (H) 10/19/2019    Creatinine, Serum / Plasma 1.57 (H) 10/14/2019    Creatinine, Serum / Plasma 1.53 (H) 10/07/2019    Urea Nitrogen, Serum / Plasma 24 10/19/2019    Urea Nitrogen, Serum / Plasma 23 10/14/2019    Urea Nitrogen, Serum / Plasma 25 10/07/2019    Sodium, Serum / Plasma 143 10/19/2019    Sodium, Serum / Plasma 143 10/14/2019    Sodium, Serum / Plasma 140 10/07/2019    Potassium, Serum / Plasma 3.5 10/19/2019    Potassium, Serum / Plasma 3.5 10/14/2019    Potassium, Serum / Plasma 3.9 10/07/2019    Carbon Dioxide, Total 25 10/19/2019    Carbon Dioxide, Total 22 10/14/2019    Carbon Dioxide, Total 28 10/07/2019    Calcium, total, Serum / Plasma 10.0 10/19/2019    Calcium, total, Serum / Plasma 10.1 10/14/2019    Calcium, total, Serum / Plasma 10.2 10/07/2019    Phosphorus, Serum / Plasma 3.7 10/19/2019    Phosphorus, Serum / Plasma 3.1 10/14/2019    Phosphorus,  Serum / Plasma 3.9 10/07/2019     Lab Results   Component Value Date    WBC Count 3.1 (L) 10/19/2019    WBC Count 2.7 (L) 10/14/2019    WBC Count 2.7 (L) 10/07/2019    Hemoglobin 16.3 10/19/2019    Hemoglobin 16.9 10/14/2019    Hemoglobin 15.5 10/07/2019    Hematocrit 50.5 (H) 10/19/2019    Hematocrit 51.0 (H) 10/14/2019    Hematocrit 47.1 10/07/2019    MCV 79.0 (L) 10/19/2019    MCV 78.5 (L) 10/14/2019    MCV 79.3 (L) 10/07/2019    Platelet Count 266 10/19/2019    Platelet Count 303 10/14/2019    Platelet Count 292 10/07/2019       Lab Results   Component Value Date    Tacrolimus 7.0 10/19/2019    Tacrolimus 8.3 10/14/2019    Tacrolimus 31.0 (H) 10/07/2019    Tacrolimus 5.9 06/15/2019    Tacrolimus 5.2 06/09/2019    Tacrolimus 5.0 06/04/2019          Assessment and Plan       Deceased-donor kidney transplant recipient  S/p DDRT 05/20/2019; ESRD due  to FSGS  Serum creatinine 1.6mg  /dlas of 10/19/2019  Urine protein to creatinine ratio 0.25 g/g creatinine.    Immunosuppressive management encounter following kidney transplant  cPRA 10%.  Tacrolimus10mg  in am and 10 mg in pm with goal trough 8-10ng/ml.  Increase  MMF to 500 mg in am and 250 mg in pm ( reduced for leucopenia, WBC improving)   Continue prednisone5 mg daily.  Discussed importance of 12 hr tacrolimus level.  He will repeat 12 hr tacrolimus level.    At risk for opportunistic infections  Continue Septra MWF until 12/2/2021for toxo prophylaxis.    HTN (hypertension)  BP controlled onamlodipine 10 mg po daily.    Leucopenia  WBC 3.1 thousands/uL.  He is on  reduced dose cellcept.  Repeat CMV PCR     Visit Diagnoses and Associated Orders Placed:  No diagnosis found.   Orders Placed This Encounter    Cytomegalovirus DNA, Quantitative PCR, Plasma     Standing Status:   Future     Number of Occurrences:   1     Standing Expiration Date:   10/25/2020     Order Specific Question:   Container details     Answer:   Lavender top     Order Specific Question:    Specimen Source (required for External Lab)     Answer:   Blood [[id number]]    Complete Blood Count with Differential     Standing Status:   Future     Number of Occurrences:   1     Standing Expiration Date:   10/25/2020     Order Specific Question:   Container details     Answer:   Lavender top         Health Care Maintenance:   Recommend usual age- and sex-appropriate cancer screening plus annual dermatology visit for skin cancer surveillance   Recommend age and disease-appropriate vaccinations (killed vaccines only) including annual influenza vaccine. Live vaccines are contraindicated.     Risk Assessment: The patient is at increased risk of complications including drug toxicity, infection and malignancy as a result of immunosuppression for his kidney transplant and requires close monitoring of immunosuppression levels and dose adjustment to manage toxicities.   There is no evidence of immunosuppressive drug toxicity at this visit.               The patient requested this visit and consented to conduct this visit by telephone only. I spent a total of 25  minutes in audio communication with this patient.

## 2019-10-28 LAB — COMPLETE BLOOD COUNT WITH DIFF
% Basophils: 0.7 %
% Eosinophils: 1 %
% Monocytes: 19.9 %
% Neutrophils: 61.4 %
Abs Basophils: 22 cells/uL (ref 0–200)
Abs Eosinophils: 31 cells/uL (ref 15–500)
Abs Lymphocytes: 527 cells/uL — ABNORMAL LOW (ref 850–3900)
Abs Monocytes: 617 cells/uL (ref 200–950)
Abs NRBC: 0 cells/uL
Abs Neutrophils: 1903 cells/uL (ref 1500–7800)
Hematocrit: 50.5 % — ABNORMAL HIGH (ref 38.5–50.0)
Hemoglobin: 16.1 g/dL (ref 13.2–17.1)
Lymphs: 17 %
MCH: 25.4 pg — ABNORMAL LOW (ref 27.0–33.0)
MCHC: 31.9 g/dL — ABNORMAL LOW (ref 32.0–36.0)
MCV: 79.5 fL — ABNORMAL LOW (ref 80.0–100.0)
MPV: 10.3 fL (ref 7.5–12.5)
Platelet Count: 308 10*3/uL (ref 140–400)
RBC Count: 6.35 10*6/uL — ABNORMAL HIGH (ref 4.20–5.80)
RDW: 14.3 % (ref 11.0–15.0)
WBC Count: 3.1 10*3/uL — ABNORMAL LOW (ref 3.8–10.8)

## 2019-10-28 LAB — MAGNESIUM, SERUM / PLASMA: Magnesium, Serum / Plasma: 1.7 mg/dL (ref 1.5–2.5)

## 2019-10-28 LAB — PROTEIN, TOTAL, RANDOM URINE
Creatinine, Random Urine: 296 mg/dL (ref 20–320)
Prot Concentration,UR: 50 mg/dL — ABNORMAL HIGH (ref 5–25)
Protein/Creat Ratio, Urine: 169 mg/g creat — ABNORMAL HIGH (ref 22–128)
Protein/Creatinine Ratio, Rand: 0.169 mg/mg creat — ABNORMAL HIGH (ref 0.022–0.128)

## 2019-10-28 LAB — BASIC METABOLIC PANEL (NA, K,
BUN/Creatinine Ratio: 14 (calc) (ref 6–22)
Calcium, total, Serum / Plasma: 10.2 mg/dL (ref 8.6–10.3)
Carbon Dioxide, Total: 28 mmol/L (ref 20–32)
Chloride, Serum / Plasma: 105 mmol/L (ref 98–110)
Creatinine, Serum / Plasma: 1.62 mg/dL — ABNORMAL HIGH (ref 0.60–1.35)
Glucose: 81 mg/dL (ref 65–99)
Potassium, Serum / Plasma: 3.5 mmol/L (ref 3.5–5.3)
Sodium, Serum / Plasma: 141 mmol/L (ref 135–146)
Urea Nitrogen, Serum / Plasma: 23 mg/dL (ref 7–25)
eGFR - high estimate: 59 mL/min/{1.73_m2} — ABNORMAL LOW (ref >=60)
eGFR - low estimate: 51 mL/min/{1.73_m2} — ABNORMAL LOW (ref >=60)

## 2019-10-28 LAB — PHOSPHORUS, SERUM / PLASMA: Phosphorus, Serum / Plasma: 4.2 mg/dL (ref 2.5–4.5)

## 2019-10-28 LAB — TACROLIMUS, HIGHLY SENSITIVE,: Tacrolimus: 13.4 mcg/L

## 2019-10-28 NOTE — Progress Notes (Addendum)
39M, ESRD d/t FSGS/HTN, s/p DDRT 05/20/2019. cPRA 10%     IS (per chart):  = Tacrolimus 10 mg/10 mg              -Goal 12 hour trough 8-10 ng/ml              -Current level 13.4, 10/27/19; 7.0, 10/19/19  = Mycophenolate 500 mg/250 mg (low WBC)  = Prednisone 5 mg QD     Creatinine: 1.62, 10/27/19    Next KTU appt: 12/28/2019 Sandi Carne, MD    Issue:  Tac above goal.    RN action:  MyChart msg to pt to assess. Read but no reply so 2nd message sent. Late 5/17, received reply; reports dose is '10' (assume 10 mg BID) ad didn't take med before lab. Did not answer question about whether it was a 12 hr level.   Found per chart that new labs were done 11/02/19.  Will follow for results.

## 2019-10-29 LAB — CYTOMEGALOVIRUS DNA, QUANTITAT
CMV DNA, Quant. PCR: <200 IU/mL
CMV DNA, Quantitative PCR (log: <2.3 Log IU/mL

## 2019-11-03 NOTE — Progress Notes (Signed)
58M, ESRD d/t FSGS/HTN, s/p DDRT 05/20/2019. cPRA 10%     IS (per chart):  = Tacrolimus 10 mg/10 mg              -Goal 12 hour trough 8-10 ng/ml              -Current level 6.8, 11/02/19; 13.4, 10/27/19  = Mycophenolate 500 mg/250 mg (low WBC) - per med list  = Prednisone 5 mg QD     Creatinine: 1.46, 11/02/19    Next KTU appt: 12/28/2019 Sandi Carne, MD    Issues:  WBC 2.1, 11/02/19; 3.1, 10/27/19. MMF had been increased 10/26/19. Septra DS until 05/19/20. Tac now below goal.    RN action: T/C to patient 11/04/19, 11:57 AM. Discussed labs. He took meds right before labs at [facility] last week (that's why it was high 5/11).  Labs this week would be 11 hours.  Verified dose 10/10.  Discussed MMF dosing. He's not sure of dose for this med;need to ask son to clarify.  T/C to son 11/04/19, 12:16 PM. LVM requesting call back to clarify MMF dose.   Sent to Dr. Michaelle Birks for review.    MD Plan of Care:   Per Dr. Michaelle Birks, increase tac to 10/11. Leave MMF at 250 mg BID.    RN action:   Order for increased tac pended to MD for signature. MyChart message sent to patient with instructions.   Msg sent to [facility] [place] [facility] with request to update [facility] standing labs (expire in a few weeks). Will continue to follow labs in [facility] kidney transplant logs.

## 2019-11-04 LAB — PHOSPHORUS, SERUM / PLASMA: Phosphorus, Serum / Plasma: 3.9 mg/dL (ref 2.5–4.5)

## 2019-11-04 LAB — BASIC METABOLIC PANEL (NA, K,
BUN/Creatinine Ratio: 17 (calc) (ref 6–22)
Calcium, total, Serum / Plasma: 9.8 mg/dL (ref 8.6–10.3)
Carbon Dioxide, Total: 24 mmol/L (ref 20–32)
Chloride, Serum / Plasma: 107 mmol/L (ref 98–110)
Creatinine, Serum / Plasma: 1.46 mg/dL — ABNORMAL HIGH (ref 0.60–1.35)
Glucose: 99 mg/dL (ref 65–99)
Potassium, Serum / Plasma: 3.3 mmol/L — ABNORMAL LOW (ref 3.5–5.3)
Sodium, Serum / Plasma: 142 mmol/L (ref 135–146)
Urea Nitrogen, Serum / Plasma: 25 mg/dL (ref 7–25)
eGFR - high estimate: 67 mL/min/{1.73_m2} (ref >=60)
eGFR - low estimate: 58 mL/min/{1.73_m2} — ABNORMAL LOW (ref >=60)

## 2019-11-04 LAB — COMPLETE BLOOD COUNT WITH DIFF
% Basophils: 1.4 %
% Eosinophils: 1.4 %
% Monocytes: 17.9 %
% Neutrophils: 58.5 %
Abs Basophils: 29 cells/uL (ref 0–200)
Abs Eosinophils: 29 cells/uL (ref 15–500)
Abs Lymphocytes: 437 cells/uL — ABNORMAL LOW (ref 850–3900)
Abs Monocytes: 376 cells/uL (ref 200–950)
Abs Neutrophils: 1229 cells/uL — ABNORMAL LOW (ref 1500–7800)
Hematocrit: 52.8 % — ABNORMAL HIGH (ref 38.5–50.0)
Hemoglobin: 16.4 g/dL (ref 13.2–17.1)
Lymphs: 20.8 %
MCH: 25.2 pg — ABNORMAL LOW (ref 27.0–33.0)
MCHC: 31.1 g/dL — ABNORMAL LOW (ref 32.0–36.0)
MCV: 81 fL (ref 80.0–100.0)
MPV: 10.2 fL (ref 7.5–12.5)
Platelet Count: 276 10*3/uL (ref 140–400)
RBC Count: 6.52 10*6/uL — ABNORMAL HIGH (ref 4.20–5.80)
RDW: 14.8 % (ref 11.0–15.0)
WBC Count: 2.1 10*3/uL — ABNORMAL LOW (ref 3.8–10.8)

## 2019-11-04 LAB — PROTEIN, TOTAL, RANDOM URINE
Creatinine, Random Urine: 182 mg/dL (ref 20–320)
Prot Concentration,UR: 47 mg/dL — ABNORMAL HIGH (ref 5–25)
Protein/Creat Ratio, Urine: 258 mg/g creat — ABNORMAL HIGH (ref 22–128)
Protein/Creatinine Ratio, Rand: 0.258 mg/mg creat — ABNORMAL HIGH (ref 0.022–0.128)

## 2019-11-04 LAB — TACROLIMUS, HIGHLY SENSITIVE,: Tacrolimus: 6.8 mcg/L

## 2019-11-04 LAB — MAGNESIUM, SERUM / PLASMA: Magnesium, Serum / Plasma: 1.7 mg/dL (ref 1.5–2.5)

## 2019-11-04 MED ORDER — TACROLIMUS 1 MG CAPSULE, IMMEDIATE-RELEASE
1 mg | ORAL_CAPSULE | ORAL | 11 refills | Status: DC
Start: 2019-11-04 — End: 2020-05-31

## 2019-11-12 LAB — COMPLETE BLOOD COUNT WITH DIFF
% Basophils: 0.8 %
% Eosinophils: 0.8 %
% Monocytes: 14.1 %
% Neutrophils: 73.9 %
Abs Basophils: 30 cells/uL (ref 0–200)
Abs Eosinophils: 30 cells/uL (ref 15–500)
Abs Lymphocytes: 395 cells/uL — ABNORMAL LOW (ref 850–3900)
Abs Monocytes: 536 cells/uL (ref 200–950)
Abs NRBC: 0 cells/uL
Abs Neutrophils: 2808 cells/uL (ref 1500–7800)
Hematocrit: 50.6 % — ABNORMAL HIGH (ref 38.5–50.0)
Hemoglobin: 16.1 g/dL (ref 13.2–17.1)
Lymphs: 10.4 %
MCH: 25 pg — ABNORMAL LOW (ref 27.0–33.0)
MCHC: 31.8 g/dL — ABNORMAL LOW (ref 32.0–36.0)
MCV: 78.7 fL — ABNORMAL LOW (ref 80.0–100.0)
MPV: 11.1 fL (ref 7.5–12.5)
Platelet Count: 312 10*3/uL (ref 140–400)
RBC Count: 6.43 10*6/uL — ABNORMAL HIGH (ref 4.20–5.80)
RDW: 15.1 % — ABNORMAL HIGH (ref 11.0–15.0)
WBC Count: 3.8 10*3/uL (ref 3.8–10.8)

## 2019-11-12 LAB — COMPREHENSIVE METABOLIC PANEL
AST: 16 U/L (ref 10–40)
Alanine transaminase: 14 U/L (ref 9–46)
Albumin, Serum / Plasma: 4.4 g/dL (ref 3.6–5.1)
Albumin/Globulin Ratio: 2.1 (calc) (ref 1.0–2.5)
Alkaline Phosphatase: 82 U/L (ref 36–130)
BUN/Creatinine Ratio: 18 (calc) (ref 6–22)
Bilirubin, Total: 1.1 mg/dL (ref 0.2–1.2)
Calcium, total, Serum / Plasma: 9.7 mg/dL (ref 8.6–10.3)
Carbon Dioxide, Total: 26 mmol/L (ref 20–32)
Chloride, Serum / Plasma: 106 mmol/L (ref 98–110)
Creatinine, Serum / Plasma: 1.6 mg/dL — ABNORMAL HIGH (ref 0.60–1.35)
Globulin, Total: 2.1 g/dL (calc) (ref 1.9–3.7)
Glucose: 73 mg/dL (ref 65–99)
Potassium, Serum / Plasma: 3.9 mmol/L (ref 3.5–5.3)
Protein, Total, Serum / Plasma: 6.5 g/dL (ref 6.1–8.1)
Sodium, Serum / Plasma: 141 mmol/L (ref 135–146)
Urea Nitrogen, Serum / Plasma: 28 mg/dL — ABNORMAL HIGH (ref 7–25)
eGFR - high estimate: 60 mL/min/{1.73_m2} (ref >=60)
eGFR - low estimate: 52 mL/min/{1.73_m2} — ABNORMAL LOW (ref >=60)

## 2019-11-12 LAB — CYTOMEGALOVIRUS DNA, QUANTITAT
CMV DNA, Quant. PCR: <200 IU/mL
CMV DNA, Quantitative PCR (log: <2.3 Log IU/mL

## 2019-11-12 LAB — HGB A1C WITH EAG ESTIMATION
Hemoglobin A1c: 5.2 % of total Hgb (ref <5.7)
Mean Plasma Glucose: 108 mg/dL (calc)

## 2019-11-12 LAB — HEPATIC FUNCTION PANEL
AST: 16 U/L (ref 10–40)
Alanine transaminase: 14 U/L (ref 9–46)
Albumin, Serum / Plasma: 4.4 g/dL (ref 3.6–5.1)
Albumin/Globulin Ratio: 2.1 (calc) (ref 1.0–2.5)
Alkaline Phosphatase: 82 U/L (ref 36–130)
Bilirubin, Direct: 0.2 mg/dL (ref <=0.2)
Bilirubin, Indirect: 0.9 mg/dL (calc) (ref 0.2–1.2)
Bilirubin, Total: 1.1 mg/dL (ref 0.2–1.2)
Globulin, Total: 2.1 g/dL (calc) (ref 1.9–3.7)
Protein, Total, Serum / Plasma: 6.5 g/dL (ref 6.1–8.1)

## 2019-11-12 LAB — PHOSPHORUS, SERUM / PLASMA: Phosphorus, Serum / Plasma: 3.3 mg/dL (ref 2.5–4.5)

## 2019-11-12 LAB — CHOLESTEROL, LDL (INCL. TOT. A
Chol HDL Ratio: 3.2 (calc) (ref <5.0)
Cholesterol, HDL: 49 mg/dL (ref >=40)
Cholesterol, Total: 157 mg/dL (ref <200)
LDL Cholesterol: 88 mg/dL (calc)
LDl/HDL Ratio: 1.8 (calc)
Non HDL Cholesterol: 108 mg/dL (calc) (ref <130)
Triglycerides, serum: 108 mg/dL (ref <150)

## 2019-11-12 LAB — PARATHYROID HORMONE WITH CALCI
Calcium, total, Serum / Plasma: 9.7 mg/dL (ref 8.6–10.3)
PTH: 121 pg/mL — ABNORMAL HIGH (ref 14–64)

## 2019-11-12 LAB — PROTEIN, TOTAL, RANDOM URINE
Creatinine, Random Urine: 214 mg/dL (ref 20–320)
Prot Concentration,UR: 48 mg/dL — ABNORMAL HIGH (ref 5–25)
Protein/Creat Ratio, Urine: 224 mg/g creat — ABNORMAL HIGH (ref 22–128)
Protein/Creatinine Ratio, Rand: 0.224 mg/mg creat — ABNORMAL HIGH (ref 0.022–0.128)

## 2019-11-12 LAB — VITAMIN D, 25-HYDROXY: Vitamin D, 25-Hydroxy: 15 ng/mL — ABNORMAL LOW (ref 30–100)

## 2019-11-12 LAB — TACROLIMUS, HIGHLY SENSITIVE,: Tacrolimus: 12.2 mcg/L

## 2019-11-12 LAB — MAGNESIUM, SERUM / PLASMA: Magnesium, Serum / Plasma: 1.9 mg/dL (ref 1.5–2.5)

## 2019-11-12 NOTE — Progress Notes (Signed)
64M, ESRD d/t FSGS/HTN, s/p DDRT 05/20/2019. cPRA 10%     IS (per chart):  = Tacrolimus 10 mg/11 mg              -Goal 12 hour trough 8-10 ng/ml              -Current level 12.2, 11/10/19 (on 10/10): 6.8, 11/02/19; (med before lab): 13.4, 10/27/19  = Mycophenolate 250 mg BID (low WBC)  = Prednisone 5 mg QD     Creatinine: 1.60, 11/10/19    Next KTU appt: 12/28/2019 Sandi Carne, MD    Issue: Tac above goal on 1st labs on increased dose.    RN action:  MyChart msg to pt. Message read 11/13/19; awaiting reply. Second message sent 11/18/19. Found on 11/19/19 that pt did labs 6/2 again, tac pending. Will await result.

## 2019-11-20 LAB — MAGNESIUM, SERUM / PLASMA: Magnesium, Serum / Plasma: 2.1 mg/dL (ref 1.5–2.5)

## 2019-11-20 LAB — COMPLETE BLOOD COUNT WITH DIFF
% Basophils: 1.5 %
% Eosinophils: 1.5 %
% Monocytes: 18.4 %
% Neutrophils: 59.8 %
Abs Basophils: 39 cells/uL (ref 0–200)
Abs Eosinophils: 39 cells/uL (ref 15–500)
Abs Lymphocytes: 489 cells/uL — ABNORMAL LOW (ref 850–3900)
Abs Monocytes: 478 cells/uL (ref 200–950)
Abs NRBC: 0 cells/uL
Abs Neutrophils: 1555 cells/uL (ref 1500–7800)
Hematocrit: 51.7 % — ABNORMAL HIGH (ref 38.5–50.0)
Hemoglobin: 16.2 g/dL (ref 13.2–17.1)
Lymphs: 18.8 %
MCH: 25.7 pg — ABNORMAL LOW (ref 27.0–33.0)
MCHC: 31.3 g/dL — ABNORMAL LOW (ref 32.0–36.0)
MCV: 82.1 fL (ref 80.0–100.0)
MPV: 11.8 fL (ref 7.5–12.5)
Platelet Count: 170 10*3/uL (ref 140–400)
RBC Count: 6.3 10*6/uL — ABNORMAL HIGH (ref 4.20–5.80)
RDW: 15.8 % — ABNORMAL HIGH (ref 11.0–15.0)
WBC Count: 2.6 10*3/uL — ABNORMAL LOW (ref 3.8–10.8)

## 2019-11-20 LAB — PROTEIN, TOTAL, RANDOM URINE
Creatinine, Random Urine: 223 mg/dL (ref 20–320)
Prot Concentration,UR: 43 mg/dL — ABNORMAL HIGH (ref 5–25)
Protein/Creat Ratio, Urine: 193 mg/g creat — ABNORMAL HIGH (ref 22–128)
Protein/Creatinine Ratio, Rand: 0.193 mg/mg creat — ABNORMAL HIGH (ref 0.022–0.128)

## 2019-11-20 LAB — BASIC METABOLIC PANEL (NA, K,
BUN/Creatinine Ratio: 17 (calc) (ref 6–22)
Calcium, total, Serum / Plasma: 10.2 mg/dL (ref 8.6–10.3)
Carbon Dioxide, Total: 28 mmol/L (ref 20–32)
Chloride, Serum / Plasma: 105 mmol/L (ref 98–110)
Creatinine, Serum / Plasma: 1.56 mg/dL — ABNORMAL HIGH (ref 0.60–1.35)
Glucose: 85 mg/dL (ref 65–139)
Potassium, Serum / Plasma: 3.8 mmol/L (ref 3.5–5.3)
Sodium, Serum / Plasma: 141 mmol/L (ref 135–146)
Urea Nitrogen, Serum / Plasma: 26 mg/dL — ABNORMAL HIGH (ref 7–25)
eGFR - high estimate: 62 mL/min/{1.73_m2} (ref >=60)
eGFR - low estimate: 53 mL/min/{1.73_m2} — ABNORMAL LOW (ref >=60)

## 2019-11-20 LAB — PHOSPHORUS, SERUM / PLASMA: Phosphorus, Serum / Plasma: 4.1 mg/dL (ref 2.5–4.5)

## 2019-11-20 LAB — CYTOMEGALOVIRUS DNA, QUANTITAT
CMV DNA, Quant. PCR: <200 IU/mL
CMV DNA, Quantitative PCR (log: <2.3 Log IU/mL

## 2019-11-20 LAB — TACROLIMUS, HIGHLY SENSITIVE,: Tacrolimus: 7.6 mcg/L

## 2019-11-26 NOTE — Telephone Encounter (Signed)
First attempt--Attempted to reach patient for prescreening. Left voicemail requesting patient call back to [facility] CATCH Team at [phone].

## 2019-11-27 LAB — COMPLETE BLOOD COUNT WITH DIFF
% Basophils: 0.7 %
% Eosinophils: 1.7 %
% Monocytes: 16.3 %
% Neutrophils: 63.3 %
Abs Basophils: 20 cells/uL (ref 0–200)
Abs Eosinophils: 49 cells/uL (ref 15–500)
Abs Lymphocytes: 522 cells/uL — ABNORMAL LOW (ref 850–3900)
Abs Monocytes: 473 cells/uL (ref 200–950)
Abs NRBC: 0 cells/uL
Abs Neutrophils: 1836 cells/uL (ref 1500–7800)
Hematocrit: 51.8 % — ABNORMAL HIGH (ref 38.5–50.0)
Hemoglobin: 16.8 g/dL (ref 13.2–17.1)
Lymphs: 18 %
MCH: 26 pg — ABNORMAL LOW (ref 27.0–33.0)
MCHC: 32.4 g/dL (ref 32.0–36.0)
MCV: 80.3 fL (ref 80.0–100.0)
MPV: 11.3 fL (ref 7.5–12.5)
Platelet Count: 254 10*3/uL (ref 140–400)
RBC Count: 6.45 10*6/uL — ABNORMAL HIGH (ref 4.20–5.80)
RDW: 15.2 % — ABNORMAL HIGH (ref 11.0–15.0)
WBC Count: 2.9 10*3/uL — ABNORMAL LOW (ref 3.8–10.8)

## 2019-11-27 LAB — MAGNESIUM, SERUM / PLASMA: Magnesium, Serum / Plasma: 1.9 mg/dL (ref 1.5–2.5)

## 2019-11-27 LAB — PROTEIN, TOTAL, RANDOM URINE
Creatinine, Random Urine: 218 mg/dL (ref 20–320)
Prot Concentration,UR: 48 mg/dL — ABNORMAL HIGH (ref 5–25)
Protein/Creat Ratio, Urine: 220 mg/g creat — ABNORMAL HIGH (ref 22–128)
Protein/Creatinine Ratio, Rand: 0.22 mg/mg creat — ABNORMAL HIGH (ref 0.022–0.128)

## 2019-11-27 LAB — CYTOMEGALOVIRUS DNA, QUANTITAT
CMV DNA, Quant. PCR: <200 IU/mL
CMV DNA, Quantitative PCR (log: <2.3 Log IU/mL

## 2019-11-27 LAB — BASIC METABOLIC PANEL (NA, K,
BUN/Creatinine Ratio: 13 (calc) (ref 6–22)
Calcium, total, Serum / Plasma: 9.9 mg/dL (ref 8.6–10.3)
Carbon Dioxide, Total: 27 mmol/L (ref 20–32)
Chloride, Serum / Plasma: 104 mmol/L (ref 98–110)
Creatinine, Serum / Plasma: 1.64 mg/dL — ABNORMAL HIGH (ref 0.60–1.35)
Glucose: 79 mg/dL (ref 65–99)
Potassium, Serum / Plasma: 3.8 mmol/L (ref 3.5–5.3)
Sodium, Serum / Plasma: 142 mmol/L (ref 135–146)
Urea Nitrogen, Serum / Plasma: 22 mg/dL (ref 7–25)
eGFR - high estimate: 58 mL/min/{1.73_m2} — ABNORMAL LOW (ref >=60)
eGFR - low estimate: 50 mL/min/{1.73_m2} — ABNORMAL LOW (ref >=60)

## 2019-11-27 LAB — TACROLIMUS, HIGHLY SENSITIVE,: Tacrolimus: 8.3 mcg/L

## 2019-11-27 LAB — PHOSPHORUS, SERUM / PLASMA: Phosphorus, Serum / Plasma: 3.9 mg/dL (ref 2.5–4.5)

## 2019-11-27 NOTE — Addendum Note (Signed)
Addended byLajean Silvius on: 11/27/2019 09:10 AM     Modules accepted: Orders

## 2019-11-27 NOTE — Telephone Encounter (Signed)
Patient contacted by RN or completed COVID-19 self-assessment online and screened negative on questionnaire. POCT COVID + STAT PCR Lab Order placed.     Referral order placed if ATC appointment not made by clinic for same day ATC appointment. CATCH RN will route a message to referring clinic and remove patient from CATCH [facility].

## 2019-12-05 LAB — BASIC METABOLIC PANEL (NA, K,
BUN/Creatinine Ratio: 15 (calc) (ref 6–22)
Calcium, total, Serum / Plasma: 9.9 mg/dL (ref 8.6–10.3)
Carbon Dioxide, Total: 24 mmol/L (ref 20–32)
Chloride, Serum / Plasma: 105 mmol/L (ref 98–110)
Creatinine, Serum / Plasma: 1.49 mg/dL — ABNORMAL HIGH (ref 0.60–1.35)
Glucose: 90 mg/dL (ref 65–99)
Potassium, Serum / Plasma: 3.7 mmol/L (ref 3.5–5.3)
Sodium, Serum / Plasma: 140 mmol/L (ref 135–146)
Urea Nitrogen, Serum / Plasma: 22 mg/dL (ref 7–25)
eGFR - high estimate: 65 mL/min/{1.73_m2} (ref >=60)
eGFR - low estimate: 56 mL/min/{1.73_m2} — ABNORMAL LOW (ref >=60)

## 2019-12-05 LAB — COMPLETE BLOOD COUNT WITH DIFF
% Basophils: 0.8 %
% Eosinophils: 1.1 %
% Monocytes: 15.3 %
% Neutrophils: 67.8 %
Abs Basophils: 30 cells/uL (ref 0–200)
Abs Eosinophils: 41 cells/uL (ref 15–500)
Abs Lymphocytes: 555 cells/uL — ABNORMAL LOW (ref 850–3900)
Abs Monocytes: 566 cells/uL (ref 200–950)
Abs NRBC: 0 cells/uL
Abs Neutrophils: 2509 cells/uL (ref 1500–7800)
Hematocrit: 51.7 % — ABNORMAL HIGH (ref 38.5–50.0)
Hemoglobin: 16.5 g/dL (ref 13.2–17.1)
Lymphs: 15 %
MCH: 25.7 pg — ABNORMAL LOW (ref 27.0–33.0)
MCHC: 31.9 g/dL — ABNORMAL LOW (ref 32.0–36.0)
MCV: 80.5 fL (ref 80.0–100.0)
MPV: 10.6 fL (ref 7.5–12.5)
Platelet Count: 277 10*3/uL (ref 140–400)
RBC Count: 6.42 10*6/uL — ABNORMAL HIGH (ref 4.20–5.80)
RDW: 15.6 % — ABNORMAL HIGH (ref 11.0–15.0)
WBC Count: 3.7 10*3/uL — ABNORMAL LOW (ref 3.8–10.8)

## 2019-12-05 LAB — PHOSPHORUS, SERUM / PLASMA: Phosphorus, Serum / Plasma: 4.1 mg/dL (ref 2.5–4.5)

## 2019-12-05 LAB — CYTOMEGALOVIRUS DNA, QUANTITAT
CMV DNA, Quant. PCR: <200 IU/mL
CMV DNA, Quantitative PCR (log: <2.3 Log IU/mL

## 2019-12-05 LAB — MAGNESIUM, SERUM / PLASMA: Magnesium, Serum / Plasma: 1.9 mg/dL (ref 1.5–2.5)

## 2019-12-05 LAB — PROTEIN, TOTAL, RANDOM URINE
Creatinine, Random Urine: 218 mg/dL (ref 20–320)
Prot Concentration,UR: 50 mg/dL — ABNORMAL HIGH (ref 5–25)
Protein/Creat Ratio, Urine: 229 mg/g creat — ABNORMAL HIGH (ref 22–128)
Protein/Creatinine Ratio, Rand: 0.229 mg/mg creat — ABNORMAL HIGH (ref 0.022–0.128)

## 2019-12-05 LAB — TACROLIMUS, HIGHLY SENSITIVE,: Tacrolimus: 7.6 mcg/L

## 2019-12-07 NOTE — Progress Notes (Addendum)
Patient has consent to participate in the OKRA study.   AlloSure was ordered on 12/02/19 as per protocol for routine monitoring. The Allosure result was 0.29%. Based on the AlloSure result and other clinical information, the patient was informed of the following management plan: routine clinic follow up and labs per guideline

## 2019-12-22 NOTE — Progress Notes (Signed)
89M, ESRD d/t FSGS/HTN, s/p DDRT 05/20/2019. cPRA 10%     IS (per chart):  = Tacrolimus 10 mg/11 mg              -Goal 12 hour trough 8-10 ng/ml              -Current level 11.9, 12/18/19; 7.6; 12/01/19  = Mycophenolate 250 mg BID (low WBC)  = Prednisone 5 mg QD     Creatinine: 1.49, 12/22/19; 1.79, 12/18/19    Next KTU appt: 12/28/2019 Sandi Carne, MD    Issue: Tac above goal on 7/2; noted now that new labs done 7/6 so will follow for those results. H&H increased: 17.4/54.9, 12/22/19; 17.4/55.2, 12/18/19; 16.5/21.7, 12/01/19.    RN action:  T/C to patient then son 12/23/19, 5:00 PM.  Left PHI-sensitive vmail for both patient and son reminding generally of biopsy tomorrow and letting them know we are following up on labs. Also reminded of regular visit on Mon 7/12, info in [place]. Sent MyChart msg.    Tac in range 8.0 on 12/22/19. Reviewed erythrocytosis with Dr. Christena Deem, can address at upcoming visit.  Will continue to follow labs in [facility] kidney transplant logs.

## 2019-12-23 LAB — COMPLETE BLOOD COUNT WITH DIFF
% Basophils: 0.9 %
% Eosinophils: 0.9 %
% Monocytes: 16.1 %
% Neutrophils: 66.3 %
Abs Basophils: 31 cells/uL (ref 0–200)
Abs Eosinophils: 31 cells/uL (ref 15–500)
Abs Lymphocytes: 537 cells/uL — ABNORMAL LOW (ref 850–3900)
Abs Monocytes: 547 cells/uL (ref 200–950)
Abs NRBC: 0 cells/uL
Abs Neutrophils: 2254 cells/uL (ref 1500–7800)
Hematocrit: 55.2 % — ABNORMAL HIGH (ref 38.5–50.0)
Hemoglobin: 17.4 g/dL — ABNORMAL HIGH (ref 13.2–17.1)
Lymphs: 15.8 %
MCH: 25.3 pg — ABNORMAL LOW (ref 27.0–33.0)
MCHC: 31.5 g/dL — ABNORMAL LOW (ref 32.0–36.0)
MCV: 80.2 fL (ref 80.0–100.0)
MPV: 10.5 fL (ref 7.5–12.5)
Platelet Count: 292 10*3/uL (ref 140–400)
RBC Count: 6.88 10*6/uL — ABNORMAL HIGH (ref 4.20–5.80)
RDW: 16.5 % — ABNORMAL HIGH (ref 11.0–15.0)
WBC Count: 3.4 10*3/uL — ABNORMAL LOW (ref 3.8–10.8)

## 2019-12-23 LAB — PHOSPHORUS, SERUM / PLASMA: Phosphorus, Serum / Plasma: 3.9 mg/dL (ref 2.5–4.5)

## 2019-12-23 LAB — BASIC METABOLIC PANEL (NA, K,
BUN/Creatinine Ratio: 15 (calc) (ref 6–22)
Calcium, total, Serum / Plasma: 10.1 mg/dL (ref 8.6–10.3)
Carbon Dioxide, Total: 24 mmol/L (ref 20–32)
Chloride, Serum / Plasma: 105 mmol/L (ref 98–110)
Creatinine, Serum / Plasma: 1.79 mg/dL — ABNORMAL HIGH (ref 0.60–1.35)
Glucose: 89 mg/dL (ref 65–139)
Potassium, Serum / Plasma: 3.7 mmol/L (ref 3.5–5.3)
Sodium, Serum / Plasma: 144 mmol/L (ref 135–146)
Urea Nitrogen, Serum / Plasma: 27 mg/dL — ABNORMAL HIGH (ref 7–25)
eGFR - high estimate: 52 mL/min/{1.73_m2} — ABNORMAL LOW (ref >=60)
eGFR - low estimate: 45 mL/min/{1.73_m2} — ABNORMAL LOW (ref >=60)

## 2019-12-23 LAB — PROTEIN, TOTAL, RANDOM URINE
Creatinine, Random Urine: 459 mg/dL — ABNORMAL HIGH (ref 20–320)
Prot Concentration,UR: 59 mg/dL — ABNORMAL HIGH (ref 5–25)
Protein/Creat Ratio, Urine: 129 mg/g creat — ABNORMAL HIGH (ref 22–128)
Protein/Creatinine Ratio, Rand: 0.129 mg/mg creat — ABNORMAL HIGH (ref 0.022–0.128)

## 2019-12-23 LAB — CYTOMEGALOVIRUS DNA, QUANTITAT
CMV DNA, Quant. PCR: <200 IU/mL
CMV DNA, Quantitative PCR (log: <2.3 Log IU/mL

## 2019-12-23 LAB — TACROLIMUS, HIGHLY SENSITIVE,: Tacrolimus: 11.9 mcg/L

## 2019-12-23 LAB — MAGNESIUM, SERUM / PLASMA: Magnesium, Serum / Plasma: 1.8 mg/dL (ref 1.5–2.5)

## 2019-12-24 ENCOUNTER — Ambulatory Visit: Admit: 2019-12-24 | Discharge: 2019-12-24 | Payer: MEDICARE | Primary: Physician

## 2019-12-24 ENCOUNTER — Ambulatory Visit: Admit: 2019-12-24 | Discharge: 2019-12-24 | Payer: MEDICARE

## 2019-12-24 DIAGNOSIS — Z4822 Encounter for aftercare following kidney transplant: Secondary | ICD-10-CM

## 2019-12-24 DIAGNOSIS — Z1159 Encounter for screening for other viral diseases: Secondary | ICD-10-CM

## 2019-12-24 DIAGNOSIS — R109 Unspecified abdominal pain: Secondary | ICD-10-CM

## 2019-12-24 DIAGNOSIS — Z940 Kidney transplant status: Secondary | ICD-10-CM

## 2019-12-24 LAB — COMPLETE BLOOD COUNT WITH DIFF
% Basophils: 0.7 %
% Eosinophils: 1 %
% Monocytes: 16.6 %
% Neutrophils: 67.2 %
Abs Basophils: 20 cells/uL (ref 0–200)
Abs Eosinophils: 29 cells/uL (ref 15–500)
Abs Lymphocytes: 421 cells/uL — ABNORMAL LOW (ref 850–3900)
Abs Monocytes: 481 cells/uL (ref 200–950)
Abs Neutrophils: 1949 cells/uL (ref 1500–7800)
Hematocrit: 54.9 % — ABNORMAL HIGH (ref 38.5–50.0)
Hemoglobin: 17.4 g/dL — ABNORMAL HIGH (ref 13.2–17.1)
Lymphs: 14.5 %
MCH: 26 pg — ABNORMAL LOW (ref 27.0–33.0)
MCHC: 31.7 g/dL — ABNORMAL LOW (ref 32.0–36.0)
MCV: 82.1 fL (ref 80.0–100.0)
MPV: 10 fL (ref 7.5–12.5)
Platelet Count: 259 10*3/uL (ref 140–400)
RBC Count: 6.69 10*6/uL — ABNORMAL HIGH (ref 4.20–5.80)
RDW: 16.7 % — ABNORMAL HIGH (ref 11.0–15.0)
WBC Count: 2.9 10*3/uL — ABNORMAL LOW (ref 3.8–10.8)

## 2019-12-24 LAB — BASIC METABOLIC PANEL (NA, K,
Anion Gap: 10 (ref 4–14)
BUN/Creatinine Ratio: 12 (calc) (ref 6–22)
Calcium, total, Serum / Plasma: 10.1 mg/dL (ref 8.6–10.3)
Calcium, total, Serum / Plasma: 9.6 mg/dL (ref 8.4–10.5)
Carbon Dioxide, Total: 25 mmol/L (ref 20–32)
Carbon Dioxide, Total: 22 mmol/L (ref 22–29)
Chloride, Serum / Plasma: 107 mmol/L (ref 98–110)
Chloride, Serum / Plasma: 106 mmol/L (ref 101–110)
Creatinine, Serum / Plasma: 1.49 mg/dL — ABNORMAL HIGH (ref 0.60–1.35)
Creatinine: 1.51 mg/dL — ABNORMAL HIGH (ref 0.73–1.24)
Glucose, non-fasting: 89 mg/dL (ref 70–199)
Glucose: 94 mg/dL (ref 65–99)
Potassium, Serum / Plasma: 3.4 mmol/L — ABNORMAL LOW (ref 3.5–5.3)
Potassium, Serum / Plasma: 3 mmol/L — ABNORMAL LOW (ref 3.5–5.0)
Sodium, Serum / Plasma: 141 mmol/L (ref 135–146)
Sodium, Serum / Plasma: 138 mmol/L (ref 135–145)
Urea Nitrogen, Serum / Plasma: 18 mg/dL (ref 7–25)
Urea Nitrogen, Serum / Plasma: 23 mg/dL (ref 7–25)
eGFR - high estimate: 65 mL/min/{1.73_m2} (ref >=60)
eGFR - high estimate: 64 mL/min (ref >59)
eGFR - low estimate: 56 mL/min/{1.73_m2} — ABNORMAL LOW (ref >=60)
eGFR - low estimate: 55 mL/min — ABNORMAL LOW (ref >59)

## 2019-12-24 LAB — COMPLETE BLOOD COUNT
Hematocrit: 52.8 % (ref 41.0–53.0)
Hemoglobin: 16.6 g/dL (ref 13.6–17.5)
MCH: 25.5 pg — ABNORMAL LOW (ref 26.0–34.0)
MCHC: 31.4 g/dL (ref 31.0–36.0)
MCV: 81 fL (ref 80–100)
Platelet Count: 308 10*9/L (ref 140–450)
RBC Count: 6.51 10*12/L — ABNORMAL HIGH (ref 4.40–5.90)
WBC Count: 3.3 10*9/L — ABNORMAL LOW (ref 3.4–10.0)

## 2019-12-24 LAB — URINALYSIS WITH MICROSCOPY
Bilirubin, Urine: NEGATIVE
Glucose, (UA): NEGATIVE mg/dL
Ketones, UA: NEGATIVE mg/dL
Nitrite: NEGATIVE
Protein, UA: 100 mg/dL — AB
RBCs, urine: 3 /HPF (ref <3)
Specific Gravity: 1.021 (ref 1.002–1.030)
Squam Epith Cells: 0 /LPF
Urobilinogen: NEGATIVE mg/dL(EU/dL)
WBC Esterase: NEGATIVE
WBCs, UR: <5 /HPF (ref <5)
pH, UA: 6 (ref 4.5–8.0)

## 2019-12-24 LAB — PHOSPHORUS, SERUM / PLASMA
Phosphorus, Serum / Plasma: 3.5 mg/dL (ref 2.5–4.5)
Phosphorus, Serum / Plasma: 3.2 mg/dL (ref 2.3–4.7)

## 2019-12-24 LAB — PROTHROMBIN TIME
Int'l Normaliz Ratio: 1 (ref 0.9–1.2)
PT: 12.9 s (ref 11.7–14.9)

## 2019-12-24 LAB — TACROLIMUS, HIGHLY SENSITIVE,: Tacrolimus: 8 mcg/L

## 2019-12-24 LAB — PROTEIN, TOTAL, RANDOM URINE
Creatinine, Random Urine: 134 mg/dL (ref 20–320)
Prot Concentration,UR: 37 mg/dL — ABNORMAL HIGH (ref 5–25)
Prot Concentration,UR: 31 mg/dL
Protein/Creat Ratio, Urine: 276 mg/g creat — ABNORMAL HIGH (ref 22–128)
Protein/Creat Ratio, Urine: 149 mg/g crea (ref <150)
Protein/Creatinine Ratio, Rand: 0.276 mg/mg creat — ABNORMAL HIGH (ref 0.022–0.128)

## 2019-12-24 LAB — CREATININE, RANDOM URINE: Creatinine, Random Urine: 208.44 mg/dL

## 2019-12-24 LAB — ACTIVATED PARTIAL THROMBOPLAST: Activated Partial Thromboplast: 26.3 s (ref 20.9–30.9)

## 2019-12-24 LAB — HLA ANTIBODY - CLASS II SINGLE

## 2019-12-24 LAB — CYTOMEGALOVIRUS DNA, QUANTITAT
CMV DNA, Quant. PCR: <200 IU/mL
CMV DNA, Quantitative PCR (log: <2.3 Log IU/mL

## 2019-12-24 LAB — MAGNESIUM, SERUM / PLASMA
Magnesium, Serum / Plasma: 1.8 mg/dL (ref 1.5–2.5)
Magnesium, Serum / Plasma: 1.6 mg/dL (ref 1.6–2.6)

## 2019-12-24 LAB — POCT COVID-19 RNA, QUALITATIVE: POCT COVID-19 RNA, Qualitative: NOT DETECTED

## 2019-12-24 LAB — HLA AB - CLASS I SINGLE ANTIGE

## 2019-12-24 MED ORDER — POTASSIUM CHLORIDE ER 10 MEQ TABLET,EXTENDED RELEASE (~~LOC~~ WRAPPER)
10 | Freq: Once | ORAL | Status: AC
Start: 2019-12-24 — End: 2019-12-24
  Administered 2019-12-24: 21:00:00 40 meq via ORAL

## 2019-12-24 MED ORDER — HYDRALAZINE 20 MG/ML INJECTION SOLUTION
20 | Freq: Once | INTRAMUSCULAR | Status: DC
Start: 2019-12-24 — End: 2019-12-24

## 2019-12-24 MED ORDER — ONDANSETRON HCL (PF) 4 MG/2 ML INJECTION SOLUTION
4 | Freq: Four times a day (QID) | INTRAMUSCULAR | Status: DC | PRN
Start: 2019-12-24 — End: 2019-12-25
  Administered 2019-12-25: 05:00:00 4 mg via INTRAVENOUS

## 2019-12-24 MED ORDER — SULFAMETHOXAZOLE 800 MG-TRIMETHOPRIM 160 MG TABLET
800-160 | ORAL | Status: DC
Start: 2019-12-24 — End: 2019-12-25
  Administered 2019-12-25: 16:00:00 1 via ORAL

## 2019-12-24 MED ORDER — OXYCODONE 5 MG TABLET
5 | Freq: Four times a day (QID) | ORAL | Status: DC | PRN
Start: 2019-12-24 — End: 2019-12-25
  Administered 2019-12-25: 06:00:00 10 mg via ORAL

## 2019-12-24 MED ORDER — LIDOCAINE (PF) 10 MG/ML (1 %) INJECTION SOLUTION
10 | Freq: Once | INTRAMUSCULAR | Status: DC
Start: 2019-12-24 — End: 2019-12-25

## 2019-12-24 MED ORDER — ACETAMINOPHEN 325 MG TABLET
325 | Freq: Once | ORAL | Status: AC
Start: 2019-12-24 — End: 2019-12-24
  Administered 2019-12-25: 01:00:00 325 mg via ORAL

## 2019-12-24 MED ORDER — AMLODIPINE 10 MG TABLET
10 | Freq: Every day | ORAL | Status: DC
Start: 2019-12-24 — End: 2019-12-25
  Administered 2019-12-25: 16:00:00 10 mg via ORAL

## 2019-12-24 MED ORDER — SODIUM CHLORIDE 0.9 % (FLUSH) INJECTION SYRINGE
0.9 | INTRAMUSCULAR | Status: DC | PRN
Start: 2019-12-24 — End: 2019-12-25

## 2019-12-24 MED ORDER — ONDANSETRON HCL 4 MG TABLET
4 | Freq: Four times a day (QID) | ORAL | Status: DC | PRN
Start: 2019-12-24 — End: 2019-12-25

## 2019-12-24 MED ORDER — PREDNISONE 5 MG TABLET
5 | Freq: Every day | ORAL | Status: DC
Start: 2019-12-24 — End: 2019-12-25
  Administered 2019-12-25: 16:00:00 5 mg via ORAL

## 2019-12-24 MED ORDER — CLONIDINE HCL 0.1 MG TABLET
0.1 | Freq: Once | ORAL | Status: AC
Start: 2019-12-24 — End: 2019-12-24
  Administered 2019-12-25: 05:00:00 0.1 mg via ORAL

## 2019-12-24 MED ORDER — LORAZEPAM 0.5 MG TABLET
0.5 | Freq: Once | ORAL | Status: AC
Start: 2019-12-24 — End: 2019-12-24
  Administered 2019-12-24: 20:00:00 0.5 mg via ORAL

## 2019-12-24 MED ORDER — IOHEXOL 350 MG IODINE/ML INTRAVENOUS SOLUTION
350 | Freq: Once | INTRAVENOUS | Status: AC
Start: 2019-12-24 — End: 2019-12-24
  Administered 2019-12-25: 130 mL via INTRAVENOUS

## 2019-12-24 MED ORDER — TACROLIMUS 5 MG CAPSULE, IMMEDIATE-RELEASE
5 | Freq: Every evening | ORAL | Status: DC
Start: 2019-12-24 — End: 2019-12-25
  Administered 2019-12-25: 06:00:00 10 mg via ORAL

## 2019-12-24 MED ORDER — MYCOPHENOLATE MOFETIL 250 MG CAPSULE
250 | Freq: Two times a day (BID) | ORAL | Status: DC
Start: 2019-12-24 — End: 2019-12-25
  Administered 2019-12-25 (×2): 250 mg via ORAL

## 2019-12-24 MED ORDER — SODIUM CHLORIDE 0.9 % (FLUSH) INJECTION SYRINGE
0.9 | Freq: Three times a day (TID) | INTRAMUSCULAR | Status: DC
Start: 2019-12-24 — End: 2019-12-25
  Administered 2019-12-25 (×2): 3 mL via INTRAVENOUS

## 2019-12-24 MED ORDER — ACETAMINOPHEN 500 MG TABLET
500 | Freq: Three times a day (TID) | ORAL | Status: DC
Start: 2019-12-24 — End: 2019-12-25
  Administered 2019-12-25: 05:00:00 1000 mg via ORAL

## 2019-12-24 MED FILL — POTASSIUM CHLORIDE ER 10 MEQ TABLET,EXTENDED RELEASE: 10 mEq | ORAL | Qty: 4

## 2019-12-24 MED FILL — LORAZEPAM 0.5 MG TABLET: 0.5 mg | ORAL | Qty: 1

## 2019-12-24 NOTE — Discharge Instructions (Addendum)
Home care  If you are on ASA, it is safe to resume it 5 days post biopsy unless your physician directed you differently   If you are on coumadin/plavix/eliquis, it is safe to resume it 3 days post biopsy unless your physician directed you differently   Rest for 24 hours as much as possible   Dont drive for 24 hours to 48 hours after the procedure.  Dont shower for 24 hours after the biopsy. If you wish, you may wash yourself with a sponge or washcloth. When you are able to shower, dont scrub the site. Gently wash the area and pat it dry.  Dont lift anything heavier than 10 pounds for 3 days .  No strenuous activity for 5 days.    Call your healthcare provider right away if you have any of the following  Blood clots in your urine  Exhaustion or extreme weakness  Sudden or increased shortness of breath  Sudden chest pain or pain over your transplant  Fever of 100.4F (38C) or higher, or chills  Increasing redness, tenderness, or swelling at the biopsy site  Opening of or drainage or bleeding from the biopsy site  Increasing pain, with or without activity

## 2019-12-24 NOTE — Progress Notes (Signed)
Kidney Transplant Biopsy Procedure Note    Patient: Cody Myers   MRN#: [id number]   CSN#: [id number]   DOS: 12/24/2019     Procedure: Renal allograft biopsy.     Laterality: Right      Radiologist Performing Procedure:   Darreld Mclean, MD      Pre-biopsy blood pressure: 140/85      Details of the Procedure:  An informed consent was obtained from the patient. Then, a time-out was performed and the patient's identification, date of birth, correct side and site of the procedure, and allergies were verified. The patient was then draped and prepped in the usual sterile fashion and 1% lidocaine was used for local anesthesia.     Under direct ultrasound guidance, a 16-gauge Bard biopsy needle was introduced and advanced. 2 passes were made. The patient tolerated the procedure well.     A post biopsy scan showed:   Small amount of free fluid inferior to the transplant.      Specimens collected:  3 cores of renal allograft tissue were obtained and delivered to pathology.     Complications and Management:   NA    Blood Loss:    Minimal    Post-Procedure Diagnosis:   Status post renal allograft biopsy    Disposition:  The patient remained in the PPU, with a sandbag overlying the biopsy site. Post-biopsy monitoring will be handled by the KTU team.     Signed:  Jeanne Ivan, MD  12/24/2019  6:16 PM

## 2019-12-24 NOTE — Nursing Note (Addendum)
Discharge has been cancelled. Patient's BP consistently high w/ associated headache, nausea, and small emesis. Provider has been contacted. Patient has been admitted. Patient's son, Jeraldine Loots has been updated. Misty Stanley, NP from [place] team has been at bedside to evaluate patient.

## 2019-12-24 NOTE — Progress Notes (Signed)
TRANSPLANT NEPHROLOGY PROGRESS NOTE    Date of service: 12/24/2019    Name of the patient: Cody Myers  Date of birth: 09-08-1975  MRN: [id number]    Indication for kidney biopsy: 23-month surveillance biopsy       Assessment:  I personally reviewed the patient's medications, blood pressure, and laboratory data.   I have reviewed the indication for the biopsy.    Vitals:    12/24/19 1223   BP: 140/85   BP Location: Left upper arm   Patient Position: Lying   Pulse: 73   Resp: 20   Temp: 37.1 C (98.8 F)   TempSrc: Oral   SpO2: 98%         Recent Labs     12/24/19  1223 12/22/19  0851   WBC 3.3* 2.9*   HGB 16.6 17.4*   HCT 52.8 54.9*   PLT 308 259   NA 138 141   K 3.0* 3.4*   CL 106 107   CO2 22 25   BUN 23 18   CREAT 1.51* 1.49*   GLU 89 94   PT 12.9  --    INR 1.0  --    PTT 26.3  --        Bleeding Risk Assessment:  The patient is not taking any antiplatelet agents or anticoagulants which increase bleeding risk     Recommendation:   Safe to proceed with kidney biopsy          Buelah Manis, DO  12/24/2019  8:25 AM

## 2019-12-24 NOTE — H&P (Signed)
TRANSPLANT NEPHROLOGY H&P      Patient name: Cody Myers  Date of birth: Dec 10, 1975  MRN: [id number]  Date of service: 12/24/2019    Attending Nephrologist: Dr. Juventino Slovak, pager [phone]      Planned Procedure: Ultrasound guided kidney transplant biopsy    Indication:   5-monthsurveillance biopsy       Chief complaint: "I am here for a kidney biopsy"    HPI:  Cody Myers a 44y.oy.o. year old male S/P kidney transplant on 05/20/19, here for a kidney biopsy.   This is a 44-monthurveillance biopsy  The patient has the following complaints: none.      Past Medical History, Surgical History, Family History and Social History:  Reviewed and there have been no changes since his last visit.    Review of Systems:   General: No fevers/chills, malaise, weight loss or weight gain.  Pulmonary: No recent cough or shortness of breath.  Cardiac: No chest pain, palpitations, edema.  Gastrointestinal: No nausea/vomiting, change in appetite, constipation or diarrhea.  Genitourinary: No gross hematuria, dysuria.  No pain over allograft.  Musculoskeletal: No arthralgias/myalgias.  Dermatologic: No rash.  Neurologic: No numbness, weakness, confusion, insomnia or tremors.  Endocrine: No polyuria/polydipsia, hyperglycemia.  All other systems were reviewed and are negative.       Allergies/Contraindications   Allergen Reactions    Aspirin Other (See Comments)     Bleeding ulcers; Has stomach bleeding         Medications:  Medications Prior to Admission   Medication Sig Note    acetaminophen (TYLENOL) 500 mg tablet Take 2 tablets (1,000 mg total) by mouth every 8 (eight) hours as needed for Pain (mild pain) Max = 3000 mg/day     albuterol 90 mcg/actuation metered dose inhaler Inhale 1-2 puffs into the lungs every 6 (six) hours as needed for Wheezing or Shortness of Breath        amLODIPine (NORVASC) 10 mg tablet TAKE 1 TABLET(10 MG) BY MOUTH DAILY     docusate sodium (COLACE) 250 mg capsule Take 1 capsule (250 mg total) by  mouth Twice a day Hold for loose stools     mycophenolate (CELLCEPT) 250 mg capsule 07/21/19: Take CellCept 1 pill (250 mg) in the AM and 1 pill (250 mg) in the PM,  "or as directed"     oxyCODONE (ROXICODONE) 5 mg tablet Take 1 tablet (5 mg total) by mouth every 6 (six) hours as needed (severe pain) (Patient taking differently: Take 10 mg by mouth every 6 (six) hours as needed (severe pain)   )     oxyCODONE-acetaminophen (PERCOCET) 10-325 mg tablet Take 1 tablet by mouth every 8 (eight) hours as needed for Pain (Lower back pain)    05/20/2019: Takes 1 tab twice a day at Max     predniSONE (DELTASONE) 5 mg tablet Take 1 tablet (5 mg total) by mouth daily Starting 06/17/19 onwards.     senna (SENOKOT) 8.6 mg tablet Take 2 tablets (17.2 mg total) by mouth nightly as needed for Constipation     sulfamethoxazole-trimethoprim (SEPTRA DS) 800-160 mg tablet Take 1 tablet by mouth daily through 06/19/2019, then 1 tablet every Monday, Wednesday, and Friday Stop after 05/19/2020     tacrolimus (PROGRAF) 1 mg capsule 10/05/19: Take 10 pills (10 mg total) in the AM and 10 pills (11 mg total) in the PM. Or as directed.        All medications have been reviewed  with patient.    The patient is currently taking:  Antiplatelet agents: None    Anticoagulant agents: None     The patient has received prior to the biopsy:  No specific therapy to reduce bleeding risk      Physical Exam:   Vitals:    12/24/19 1223   BP: 140/85   BP Location: Left upper arm   Patient Position: Lying   Pulse: 73   Resp: 20   Temp: 37.1 C (98.8 F)   TempSrc: Oral   SpO2: 98%     There is no height or weight on file to calculate BMI.  General: well developed  HENT: Normocephalic and atraumatic.   Eye: Non-icteric sclera, conjunctivae pale, EOMI, PERRLA.    Chest: Good inspiratory effort. Lungs clear to auscultation & percussion without use of accessory muscles.   Heart: Regular rate and rhythm, no rubs, murmurs or gallops. no edema.  Abdomen:  Nontender/nondistended without hepatosplenomegaly or masses.   Lymphatic: No cervical or axillary adenopathy   Musculoskeletal: Normal gait, normal tone, mass, no clubbing, cyanosis.    Skin: No icterus, lesions, rashes or skin breakdown   Psychiatric: Normal mood and affect.   Neurological: Cranial nerves grossly intact. Alert and oriented x 3. No asterixis. Mild tremor.   Genitourinary: Allograft is nontender.      Laboratory Data:  I have personally reviewed and interpreted the following laboratory data:     Lab Results   Component Value Date    WBC Count 3.3 (L) 12/24/2019    Hemoglobin 16.6 12/24/2019    Hematocrit 52.8 12/24/2019    MCV 81 12/24/2019    Platelet Count 308 12/24/2019    Int'l Normaliz Ratio 1.0 12/24/2019    PT 12.9 12/24/2019    Activated Partial Thromboplastin Time 26.3 12/24/2019    Creatinine 1.51 (H) 12/24/2019    Urea Nitrogen, Serum / Plasma 23 12/24/2019    Sodium, Serum / Plasma 138 12/24/2019    Potassium, Serum / Plasma 3.0 (L) 12/24/2019    Chloride, Serum / Plasma 106 12/24/2019    Carbon Dioxide, Total 22 12/24/2019    eGFR - high estimate 64 12/24/2019    eGFR - low estimate 55 (L) 12/24/2019       Patient Active Problem List    Diagnosis Date Noted    Status post kidney transplant Jan 22, 2020    Status post deceased-donor kidney transplantation 12/01/2019    COVID-19 10/07/2019    Leucopenia 07/11/2019    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 Acute tubular epithelia injury; see comment.   2. No evidence of rejection, negative C4d stain.   2. No significant tubulointerstitial chronicity.   3. Moderate arteriosclerosis, donor-derived.     Special Stain Summary: (x8) PAS and trichrome special stains were  performed and examined to confirm the diagnosis. The  findings are  consistent with our H&E-based light microscopic impression. Six  additional PAS-stained level sections were evaluated, and no segmentally  sclerotic glomeruli are identified.     DSAs not done      Immunosuppressive management encounter following kidney transplant 05/21/2019     Induction agent/dose: Thymo lite  Regime: Tac/MMF  Corticosteroid plan: maintenance   cPRA: 10%      Transplant follow-up 05/21/2019     Category Problem Type of follow-up needed in clinic? Ordered? Scheduled? Comments   Nursing (e.g., drain/ Foley) HD tunneled line refer to IR for removal when appropriate Needs to be done in clinic weekly dressing changes in HD clinic until can be removed   Lab/ Micro Standard Tac Follow up results   Needs to be ordered None   Imaging studies/ procedures NA NA NA None   Referrals Uroglogy for stent removal NA Already ordered None   Delayed graft function and improving UOP, monitor for resolution        Ureteral stent retained of kidney transplant (CMS code) 05/21/2019     Implant Name Type Inv. Item Serial No. Manufacturer Lot No. LRB No. Used Action   STENT URETHRAL 6FR X 16CM DOUBLE J   [id number] - [id number] Stents-Non Des STENT URETHRAL 6FR X 16CM DOUBLE J   [id number]   [id number] Right 1 Implanted     Referral placed for outpatient stent removal    07/01/19 stent removed.       At risk for opportunistic infections 05/21/2019     Transplant Serology Workup:  Recipient        Lab Results   Component Value Date    HBCAB NEG 12/17/2018    HBSAG NEG 12/17/2018    HBABQ >800 12/17/2018    HCV NEG  12/17/2018    RPR Nonreactive 12/17/2018    CMVAB POS (A) 12/17/2018    VCAM NEG 12/17/2018    EBNA POS (A) 12/17/2018    TOXO NEG 12/17/2018         Donor        Cadaver Donor UNOS ID (no units)   Date Value   05/20/2019 [id number]     Match ID: [id number]          Induction Plan:   Thymo + steroid maintenance     Plan for PPX:   Toxo prophylaxis: (D +/R - Toxo MISMATCH), Septra DS 1 tab PO Daily x 1 month (06/19/2019), then qMWF x 11 months more (05/19/2020) -- 1 year total for Toxo Mismatch   PCP prophylaxis: Septra DS as per above for Toxo ppx   CMV prophylaxis: (D +/R +), Valcyte 900 mg PO Daily (adjusted for renal function) x 3 months (08/18/2019)   Fungal prophylaxis: Fluconazole 100 mg PO q7days x 1 month (06/18/2019)  HBV prophylaxis: Not necessary            At risk of infection transmitted from donor 05/21/2019     Patient underwent transplant with no known donor derived risk factors: Donor is not CDC high risk, not HBV core ab + and there are no known donor derived infections. No need for treatment or monitoring beyond the usual opportunistic infection prophylaxis per protocol. Donor with no known malignancies.  The donor's CMV status is IgG positive and the recipient's CMV status is IgG positive.  Prophylaxis will be determined by risk of CMV transmission.                Delayed graft function of  kidney 05/21/2019     Dialysis Type: HD Hemodialysis  Dialysis Center: [facility]         Phone: [phone]       Fax: [phone]       Address: 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  Been on HD since 2010.  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 S/P kidney transplant  The patient's graft function is stable; he will get a biopsy today. We will monitor the graft function closely.  Results of the biopsy done today will inform future management of the graft.     2. Hypertension, renal disease:   The patient's blood pressure is  well controlled   Blood pressure control will be optimized and blood pressure monitored closely pre- and post-biopsy to reduce the risk of bleeding complications following the biopsy.     3. Bleeding risk assessment:  I have assessed the risk of bleeding complications post-biopsy.   The patient is not taking any antiplatelet agents or anticoagulants which increase bleeding risk   The platelet count and INR are within acceptable parameters for the biopsy.  The patient will have close monitoring of vital signs and H/H post-biopsy.     4. Immunosuppression: Patient will continue his current regimen. Further decisions will be made once the results of the biopsy are available and reviewed with pathology      The indication for the biopsy and a general description of risks and benefits was discussed with the patient.     Signed,  Faythe Casa, DO  12/24/2019

## 2019-12-25 ENCOUNTER — Ambulatory Visit: Admit: 2019-12-25 | Discharge: 2019-12-25 | Payer: MEDICARE

## 2019-12-25 LAB — COMPLETE BLOOD COUNT
Hematocrit: 54.9 % — ABNORMAL HIGH (ref 41.0–53.0)
Hematocrit: 53.4 % — ABNORMAL HIGH (ref 41.0–53.0)
Hemoglobin: 17.3 g/dL (ref 13.6–17.5)
Hemoglobin: 16.7 g/dL (ref 13.6–17.5)
MCH: 25.3 pg — ABNORMAL LOW (ref 26.0–34.0)
MCH: 25.5 pg — ABNORMAL LOW (ref 26.0–34.0)
MCHC: 31.5 g/dL (ref 31.0–36.0)
MCHC: 31.3 g/dL (ref 31.0–36.0)
MCV: 80 fL (ref 80–100)
MCV: 82 fL (ref 80–100)
Platelet Count: 325 10*9/L (ref 140–450)
Platelet Count: 325 10*9/L (ref 140–450)
RBC Count: 6.85 10*12/L — ABNORMAL HIGH (ref 4.40–5.90)
RBC Count: 6.54 10*12/L — ABNORMAL HIGH (ref 4.40–5.90)
WBC Count: 5.9 10*9/L (ref 3.4–10.0)
WBC Count: 5.6 10*9/L (ref 3.4–10.0)

## 2019-12-25 MED FILL — ACETAMINOPHEN 500 MG TABLET: 500 mg | ORAL | Qty: 2

## 2019-12-25 MED FILL — SULFAMETHOXAZOLE 800 MG-TRIMETHOPRIM 160 MG TABLET: 800-160 mg | ORAL | Qty: 1

## 2019-12-25 MED FILL — OXYCODONE 5 MG TABLET: 5 mg | ORAL | Qty: 2

## 2019-12-25 MED FILL — PREDNISONE 5 MG TABLET: 5 mg | ORAL | Qty: 1

## 2019-12-25 MED FILL — HYDRALAZINE 20 MG/ML INJECTION SOLUTION: 20 mg/mL | INTRAMUSCULAR | Qty: 1

## 2019-12-25 MED FILL — MYCOPHENOLATE MOFETIL 250 MG CAPSULE: 250 mg | ORAL | Qty: 1

## 2019-12-25 MED FILL — PROGRAF 5 MG CAPSULE: 5 mg | ORAL | Qty: 2

## 2019-12-25 MED FILL — ONDANSETRON HCL (PF) 4 MG/2 ML INJECTION SOLUTION: 4 mg/2 mL | INTRAMUSCULAR | Qty: 2

## 2019-12-25 MED FILL — AMLODIPINE 10 MG TABLET: 10 mg | ORAL | Qty: 1

## 2019-12-25 MED FILL — ACETAMINOPHEN 325 MG TABLET: 325 mg | ORAL | Qty: 1

## 2019-12-25 MED FILL — CLONIDINE HCL 0.1 MG TABLET: 0.1 mg | ORAL | Qty: 1

## 2019-12-25 NOTE — Plan of Care (Addendum)
Patient denies further headache, back pain or nausea this shift. BP improved. See flowsheet for details. No active bleeding from puncture site. Clear, yellow UOP. Patient slept through the night and voices desire to be discharged home today.    Problem: Bleeding, at Risk or Actual - Adult  Goal: Absence of impaired coagulation signs and symptoms / active bleeding  Outcome: Progress within 12 hours     Problem: GU Elimination Impaired - Adult  Goal: Able to void spontaneously  Outcome: Progress within 12 hours     Problem: Infection, at Risk and Actual - Adult  Goal: Prevention of infection  Outcome: Progress within 12 hours     Problem: Nausea/Vomiting - Adult  Goal: Absence of nausea and vomiting  Outcome: Progress within 12 hours     Problem: Nutrition, Alteration in - Adult  Goal: Adequate nutritional intake  Outcome: Progress within 12 hours     Problem: Pain,  Acute / Chronic - Adult  Goal: Control of acute pain  Outcome: Progress within 12 hours

## 2019-12-25 NOTE — Progress Notes (Signed)
Allograft Kidney Biopsy Preliminary Review for Bx done 12/24/19  37-month surveillance biopsy       Findings:   No evidence of rejection  Mild IFTA, ci 1 , ct 1     Impression:   No evidence of rejection    Plan:  - continue plan of care, no changes  - F/u DSA and BK  - Prelim results and plan discussed with patient via telephone.    Blease Capaldi Trellis Moment Nephrology Fellow  Pager [phone]

## 2019-12-25 NOTE — Discharge Summary (Signed)
[facility] - DISCHARGE SUMMARY     Patient Name: Cody Myers  Patient MRN: [id number]  Date of Birth: 14-Feb-1976    Facility: [facility]  Attending Physician: Colbert Coyer, MD    Date of Admission: 12/24/2019  Date of Discharge: 12/25/2019    Admission Diagnosis: status post kidney transplant  Discharge Diagnosis: Status post kidney transplant    Discharge Disposition: Home    History (with Chief Complaint)    Cody Myers is a 44 y.o. male S/P kidney transplant on 05/20/19, here for a 58-month surveillance biopsy, stayed for observation overnight due to severe pain first at biopsy site, then HA with hypertension and nausea, dry heaving. All symptoms resolved by the next morning, when he was discharged. Hgb remained stable throughout, and CT abd/pelvis done immediately after biopsy was unremarkable.    1. S/P kidney transplant  The patient's graft function is stable; had 87mo mgmt biopsy 12/24/19. Will update with prelim reults    2. Hypertension, renal disease:   - Monitored overnight for HTN, better controlled on discharge    3. Bleeding risk assessment:  - s/p close monitoring of vital signs and H/H post-biopsy.   - Hgb stable    4. Immunosuppression: Patient will continue his current regimen. Further decisions will be made once the results of the biopsy are available and reviewed with pathology      Physical Exam at Discharge  BP (!) 133/93 (BP Location: Left upper arm, Patient Position: Sitting)   Pulse 61   Temp 36.8 C (98.2 F) (Oral)   Resp 16   SpO2 98%       Intake/Output Summary (Last 24 hours) at 12/25/2019 1502  Last data filed at 12/25/2019 0900  Gross per 24 hour   Intake 1400 ml   Output 1725 ml   Net -325 ml       Physical Exam     General: well developed, no acute distress on room air  HENT: normocephalic, atraumatic  Eye: blind  Chest: clear to auscultation bilaterally, no rales or wheezes  Heart: regular rate and rhythm, no murmurs  Abdomen: soft, nontender, nondistended  Musculoskeletal:  no edema or deformity  Skin: No icterus, lesions, rashes or skin breakdown.  Psychiatric: Normal mood and affect.  Neurological: Cranial nerves grossly intact. Alert and oriented x 3. No asterixis. No tremor.  Genitourinary: Allograft is nontender, there is no bruit.        Procedures Performed and Complications  Transplant biopsy 12/24/19    DISCHARGE INSTRUCTIONS    Discharge Diet  Low Sodium Diet    Functional Assessment at Discharge/Activity Goals  No functional activity limits.    Allergies and Medications at Discharge    Allergies: Aspirin       Medication List      CHANGE how you take these medications    oxyCODONE 5 mg tablet  Commonly known as: ROXICODONE  Take 1 tablet (5 mg total) by mouth every 6 (six) hours as needed (severe pain)  What changed: how much to take     tacrolimus 1 mg capsule  Commonly known as: PROGRAF  10/05/19: Take 10 pills (10 mg total) in the AM and 10 pills (11 mg total) in the PM. Or as directed.  What changed: additional instructions        CONTINUE taking these medications    acetaminophen 500 mg tablet  Commonly known as: TYLENOL  Take 2 tablets (1,000 mg total) by mouth every 8 (eight) hours as  needed for Pain (mild pain) Max = 3000 mg/day     albuterol 90 mcg/actuation metered dose inhaler  Commonly known as: PROVENTIL HFA;VENTOLIN HFA     amLODIPine 10 mg tablet  Commonly known as: NORVASC  TAKE 1 TABLET(10 MG) BY MOUTH DAILY     docusate sodium 250 mg capsule  Commonly known as: COLACE  Take 1 capsule (250 mg total) by mouth Twice a day Hold for loose stools     mycophenolate 250 mg capsule  Commonly known as: Cellcept  07/21/19: Take CellCept 1 pill (250 mg) in the AM and 1 pill (250 mg) in the PM,  "or as directed"     PERCOCET 10-325 mg tablet  Generic drug: oxyCODONE-acetaminophen     predniSONE 5 mg tablet  Commonly known as: DELTASONE  Take 1 tablet (5 mg total) by mouth daily Starting 06/17/19 onwards.     senna 8.6 mg tablet  Commonly known as: SENOKOT  Take 2 tablets  (17.2 mg total) by mouth nightly as needed for Constipation     sulfamethoxazole-trimethoprim 800-160 mg tablet  Commonly known as: SEPTRA DS  Take 1 tablet by mouth daily through 06/19/2019, then 1 tablet every Monday, Wednesday, and Friday Stop after 05/19/2020            Discharge With Opioid Medications for:  CHRONIC PAIN    According to our records, this patient was taking opioid medications before this hospitalization.   We do not plan to manage refill and the Primary Care Provider should manage refills.   Please evaluate with each refill of an opioid that your patient meets the criteria for intensified opioid therapy with the goal to avoid escalation of chronic opioid use. Please ensure the patient knows about alternatives for pain management and understands how to taper opioids.            Pending Tests  Pending Labs     Order Current Status    BK virus, DNA, Quantitative, plasma In process        Pending BK and DSA    Outside Follow-up       Booked [place] Appointments  Future Appointments   Date Time Provider Department Center   12/28/2019  9:20 AM Sandi Carne, MD [facility] Practice       Pending [place] Referrals        Case Management Services Arranged  Case Management Services Arranged: (all recorded)           Discharge Assessment  Condition at discharge:  fair   Final Discharge Disposition: Home or Self Care    Covid-19 Vaccines     No immunizations on file.        Advance Care Planning Documentation during this hospitalization:    Code Status: Prior    Last [facility] Agent (Valid for this hospitalization only)     None        Patient-Level Advance directive, POLST, or Living Will Documents:    There are no patient-level advance directive, polst, or living will documents.           Advance Care Planning Documentation       Primary Care Physician  [facility]  Address: [address] / [place] [address]   Phone: [phone]  Fax: [phone]     Outside Providers, for  pending tests please use the following numbers:   For [facility] - Please Call: [phone]    For [facility] -  Please Call: [phone]   For [facility] - Please Call: [phone]    Signed,  Buelah Manis, DO  12/25/2019          Discharge Instructions provided to the patient (if any):    Discharge Instructions     Home care  If you are on ASA, it is safe to resume it 5 days post biopsy unless your physician directed you differently   If you are on coumadin/plavix/eliquis, it is safe to resume it 3 days post biopsy unless your physician directed you differently   Rest for 24 hours as much as possible   Dont drive for 24 hours to 48 hours after the procedure.  Dont shower for 24 hours after the biopsy. If you wish, you may wash yourself with a sponge or washcloth. When you are able to shower, dont scrub the site. Gently wash the area and pat it dry.  Dont lift anything heavier than 10 pounds for 3 days .  No strenuous activity for 5 days.    Call your healthcare provider right away if you have any of the following  Blood clots in your urine  Exhaustion or extreme weakness  Sudden or increased shortness of breath  Sudden chest pain or pain over your transplant  Fever of 100.15F (38C) or higher, or chills  Increasing redness, tenderness, or swelling at the biopsy site  Opening of or drainage or bleeding from the biopsy site  Increasing pain, with or without activity          Patient Instructions    None

## 2019-12-26 LAB — BK VIRUS, DNA, QUANTITATIVE, P: BK virus, DNA, Quantitative, p: NOT DETECTED {copies}/mL

## 2019-12-28 ENCOUNTER — Ambulatory Visit: Admit: 2019-12-28 | Payer: MEDICARE | Attending: Nephrology | Primary: Physician

## 2019-12-28 DIAGNOSIS — H548 Legal blindness, as defined in USA: Secondary | ICD-10-CM

## 2019-12-28 DIAGNOSIS — D72819 Decreased white blood cell count, unspecified: Secondary | ICD-10-CM

## 2019-12-28 DIAGNOSIS — Z9189 Other specified personal risk factors, not elsewhere classified: Secondary | ICD-10-CM

## 2019-12-28 DIAGNOSIS — Z940 Kidney transplant status: Secondary | ICD-10-CM

## 2019-12-28 DIAGNOSIS — Z79899 Other long term (current) drug therapy: Secondary | ICD-10-CM

## 2019-12-28 MED ORDER — OMEPRAZOLE 20 MG CAPSULE,DELAYED RELEASE: 20 mg | ORAL | Status: AC

## 2019-12-28 NOTE — Progress Notes (Signed)
POST-KIDNEY TRANSPLANT NEPHROLOGY PROGRESS NOTE         Subjective      Chief Complaint   Patient presents with    Kidney Transplant Follow-up       History of Present Illness:  [name] is a 44 y.o. year old male S/P kidney transplant in 05/20/2019  being seen for a follow-up visit. 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 Less likely consideration would include a loop of small bowel.  He is totally asymptomatic now, feels well, no pain.      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 Acute tubular epithelia injury; see comment.   2. No evidence of rejection, negative C4d stain.   2. No significant tubulointerstitial chronicity.   3. Moderate arteriosclerosis, donor-derived.     Special Stain Summary: (x8) PAS and trichrome special stains were  performed and examined to confirm the  diagnosis. The findings are  consistent with our H&E-based light microscopic impression. Six  additional PAS-stained level sections were evaluated, and no segmentally  sclerotic glomeruli are identified.     DSAs not done      Immunosuppressive management encounter following kidney transplant 05/21/2019     Induction agent/dose: Thymo lite  Regime: Tac/MMF  Corticosteroid plan: maintenance   cPRA: 10%      Transplant follow-up 05/21/2019     Category Problem Type of follow-up needed in clinic? Ordered? Scheduled? Comments   Nursing (e.g., drain/ Foley) HD tunneled line refer to IR for removal when appropriate Needs to be done in clinic weekly dressing changes in HD clinic until can be removed   Lab/ Micro Standard Tac Follow up results   Needs to be ordered None   Imaging studies/ procedures NA NA NA None   Referrals Uroglogy for stent removal NA Already ordered None   Delayed graft function and improving UOP, monitor for resolution        Ureteral stent retained of kidney transplant (CMS code) 05/21/2019     Implant Name Type Inv. Item Serial No. Manufacturer Lot No. LRB No. Used Action   STENT URETHRAL 6FR X 16CM DOUBLE J   [id number] - [id number] Stents-Non Des STENT URETHRAL 6FR X 16CM DOUBLE J   [id number]   [id number] Right 1 Implanted     Referral placed for outpatient stent removal  07/01/19 stent removed.       At risk for opportunistic infections 05/21/2019     Transplant Serology Workup:  Recipient        Lab Results   Component Value Date    HBCAB NEG 12/17/2018    HBSAG NEG 12/17/2018    HBABQ >800 12/17/2018    HCV NEG 12/17/2018    RPR Nonreactive 12/17/2018    CMVAB POS (A) 12/17/2018    VCAM NEG 12/17/2018    EBNA POS (A) 12/17/2018    TOXO NEG 12/17/2018         Donor        Cadaver Donor UNOS ID (no units)   Date Value   05/20/2019 [id number]     Match ID: [id number]          Induction Plan:   Thymo + steroid maintenance     Plan for PPX:   Toxo prophylaxis: (D +/R - Toxo MISMATCH), Septra DS  1 tab PO Daily x 1 month (06/19/2019), then qMWF x 11 months more (05/19/2020) -- 1 year total for Toxo Mismatch   PCP prophylaxis: Septra DS as per above for Toxo ppx   CMV prophylaxis: (D +/R +), Valcyte 900 mg PO Daily (adjusted for renal function) x 3 months (08/18/2019)   Fungal prophylaxis: Fluconazole 100 mg PO q7days x 1 month (06/18/2019)  HBV prophylaxis: Not necessary            At risk of infection transmitted from donor 05/21/2019     Patient underwent transplant with no known donor derived risk factors: Donor is not CDC high risk, not HBV core ab + and there are no known donor derived infections. No need for treatment or monitoring beyond the usual opportunistic infection prophylaxis per protocol. Donor with no known malignancies.  The donor's CMV status is IgG positive and the recipient's CMV status is IgG positive.  Prophylaxis will be determined by risk of CMV transmission.                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     Dialysis Type: HD Hemodialysis  Dialysis Center: [facility]         Phone: [phone]        Fax: [phone]       Address: 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  Been on HD since 2010.  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   Medication Sig    acetaminophen (TYLENOL) 500 mg tablet Take 2 tablets  (1,000 mg total) by mouth every 8 (eight) hours as needed for Pain (mild pain) Max = 3000 mg/day    albuterol 90 mcg/actuation metered  dose inhaler Inhale 1-2 puffs into the lungs every 6 (six) hours as needed for Wheezing or Shortness of Breath       amLODIPine (NORVASC) 10 mg tablet TAKE 1 TABLET(10 MG) BY MOUTH DAILY    docusate sodium (COLACE) 250 mg capsule Take 1 capsule (250 mg total) by mouth Twice a day Hold for loose stools    mycophenolate (CELLCEPT) 250 mg capsule 07/21/19: Take CellCept 1 pill (250 mg) in the AM and 1 pill (250 mg) in the PM,  "or as directed"    omeprazole (PRILOSEC) 20 mg capsule Take 20 mg by mouth in the morning and at bedtime    oxyCODONE-acetaminophen (PERCOCET) 10-325 mg tablet Take 1 tablet by mouth every 8 (eight) hours as needed for Pain (Lower back pain)       predniSONE (DELTASONE) 5 mg tablet Take 1 tablet (5 mg total) by mouth daily Starting 06/17/19 onwards.    senna (SENOKOT) 8.6 mg tablet Take 2 tablets (17.2 mg total) by mouth nightly as needed for Constipation    sulfamethoxazole-trimethoprim (SEPTRA DS) 800-160 mg tablet Take 1 tablet by mouth daily through 06/19/2019, then 1 tablet every Monday, Wednesday, and Friday Stop after 05/19/2020    tacrolimus (PROGRAF) 1 mg capsule 10/05/19: Take 10 pills (10 mg total) in the AM and 10 pills (11 mg total) in the PM. Or as directed. (Patient taking differently: 10/05/19: Take 10 pills (11 mg total) in the AM and 10 pills (10 mg total) in the PM. Or as directed.  )             Objective            Review of Prior Testing  I personally reviewed data as noted below including the following:    1. Metabolic panel  2. Complete blood count  3. Urine chemistry including level of proteinuria  4. Level of immunosuppressive drug (tacrolimus).       Lab Results   Component Value Date    Creatinine 1.51 (H) 12/24/2019    Creatinine 2.12 (H) 06/15/2019    Creatinine 2.52 (H) 06/09/2019    Creatinine, Serum / Plasma 1.49 (H)  12/22/2019    Creatinine, Serum / Plasma 1.79 (H) 12/18/2019    Creatinine, Serum / Plasma 1.49 (H) 12/01/2019    Urea Nitrogen, Serum / Plasma 23 12/24/2019    Urea Nitrogen, Serum / Plasma 18 12/22/2019    Urea Nitrogen, Serum / Plasma 27 (H) 12/18/2019    Sodium, Serum / Plasma 138 12/24/2019    Sodium, Serum / Plasma 141 12/22/2019    Sodium, Serum / Plasma 144 12/18/2019    Potassium, Serum / Plasma 3.0 (L) 12/24/2019    Potassium, Serum / Plasma 3.4 (L) 12/22/2019    Potassium, Serum / Plasma 3.7 12/18/2019    Carbon Dioxide, Total 22 12/24/2019    Carbon Dioxide, Total 25 12/22/2019    Carbon Dioxide, Total 24 12/18/2019    Calcium, total, Serum / Plasma 9.6 12/24/2019    Calcium, total, Serum / Plasma 10.1 12/22/2019    Calcium, total, Serum / Plasma 10.1 12/18/2019    Phosphorus, Serum / Plasma 3.2 12/24/2019    Phosphorus, Serum / Plasma 3.5 12/22/2019    Phosphorus, Serum / Plasma 3.9 12/18/2019     Lab Results   Component Value Date    WBC Count 5.6 12/25/2019    WBC Count 5.9 12/24/2019    WBC Count 3.3 (L) 12/24/2019    Hemoglobin 16.7  12/25/2019    Hemoglobin 17.3 12/24/2019    Hemoglobin 16.6 12/24/2019    Hematocrit 53.4 (H) 12/25/2019    Hematocrit 54.9 (H) 12/24/2019    Hematocrit 52.8 12/24/2019    MCV 82 12/25/2019    MCV 80 12/24/2019    MCV 81 12/24/2019    Platelet Count 325 12/25/2019    Platelet Count 325 12/24/2019    Platelet Count 308 12/24/2019       Lab Results   Component Value Date    Tacrolimus 8.0 12/22/2019    Tacrolimus 11.9 12/18/2019    Tacrolimus 7.6 12/01/2019    Tacrolimus 5.9 06/15/2019    Tacrolimus 5.2 06/09/2019    Tacrolimus 5.0 06/04/2019          Assessment and Plan    Deceased-donor kidney transplant recipient  S/p DDRT 05/20/2019; 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    Immunosuppressive management encounter following kidney transplant  cPRA 10%.  Tacrolimus11 mgin am and 10 mg in  pmwith goal trough 7-9ng/ml.  MMF 250 mg bid ( reducedfor leucopenia, WBC improving)   Continue prednisone5 mg daily.  Results of allograft biopsy are pending.DSA pending.    At risk for opportunistic infections  Continue Septra MWF until 12/2/2021for toxo prophylaxis.    HTN (hypertension)  BP controlled onamlodipine 10 mg po daily.    Leucopenia  Improved.  He is onreduced dose cellcept.      Hypokalemia : High potassium diet advised.        Visit Diagnoses and Associated Orders Placed:  1. Deceased-donor kidney transplant recipient     2. Immunosuppressive management encounter following kidney transplant     3. At risk for opportunistic infections     4. Legally blind     5. Leukopenia, unspecified type        Orders Placed This Encounter    omeprazole (PRILOSEC) 20 mg capsule     Sig: Take 20 mg by mouth in the morning and at bedtime       Nutritional assessment: Patient appears well developed and well-nourished. No need for a dietary consult at this time   Pharmacy assessment: Current medications reviewed and discussed with patient.     Health Care Maintenance:   Recommend usual age- and sex-appropriate cancer screening plus annual dermatology visit for skin cancer surveillance   Recommend age and disease-appropriate vaccinations (killed vaccines only) including annual influenza vaccine. Live vaccines are contraindicated.     Risk Assessment: The patient is at increased risk of complications including drug toxicity, infection and malignancy as a result of immunosuppression for his kidney transplant and requires close monitoring of immunosuppression levels and dose adjustment to manage toxicities.   There is no evidence of immunosuppressive drug toxicity at this visit.

## 2019-12-29 LAB — HLA ANTIBODY - CLASS I SINGLE

## 2019-12-29 LAB — HLA ANTIBODY - CLASS II SINGLE

## 2019-12-29 LAB — HLA ANTIBODY REPORT

## 2019-12-31 NOTE — Progress Notes (Signed)
46M, ESRD d/t FSGS/HTN, s/p DDRT 05/20/2019. cPRA 10%     IS (per chart):  = Tacrolimus 11 mg/10 mg              -Goal 12 hour trough 7-9 ng/ml              -Current level 9.9, 12/30/19 (11 hour level)  = Mycophenolate 250 mg BID (low WBC)  = Prednisone 5 mg QD     Creatinine: 1.62, 12/30/19  UPC: 805 mg/g, 12/30/19; 276, 12/22/19    Next KTU appt: 02/25/2020 Sandi Carne, MD    Issues: UPC increased. Later received vmail from pt re: artichoke supplement for liver/metabolism.    RN action:  MyChart msg to pt 12/31/19 re: UPC. Msg read, no reply. T/C to patient 01/05/20, 8:56 AM. Denies illness, N/V/D, UTI symptoms except recently increased urination at night, has talked to his PCP to ask about prostate issues. Denies pain over bladder or TX, does have lower left side of back near buttocks. Reported this to his PCP who thought he had muscle strain due to moving something heavy, and was given "antibiotics" but only took one. No lab or urine tests. No other med changes. Sent to Dr. Christena Deem for review.    MD Plan of Care:   Per Dr. Christena Deem, ask if pt can send photo of label to check supplement ingredients.   Recheck UPC along with UA and culture. Dr. Michaelle Birks can decide at the next visit whether to change frequency of UPC     RN action:  Order input at [facility] per Dr. Christena Deem, Requisition [phone]. MyChart message sent to patient with instructions. T/C to patient 01/05/20, 12:41 PM to provide instructions. He can go to [employer]. Doesn't have the supplement yet but will send when he does. Patient expressed understanding and acceptance of instructions, and had no additional questions.

## 2020-01-02 LAB — PROTEIN, TOTAL, RANDOM URINE
Creatinine, Random Urine: 154 mg/dL (ref 20–320)
Prot Concentration,UR: 124 mg/dL — ABNORMAL HIGH (ref 5–25)
Protein/Creat Ratio, Urine: 805 mg/g creat — ABNORMAL HIGH (ref 22–128)
Protein/Creatinine Ratio, Rand: 0.805 mg/mg creat — ABNORMAL HIGH (ref 0.022–0.128)

## 2020-01-02 LAB — COMPLETE BLOOD COUNT WITH DIFF
% Basophils: 0.7 %
% Eosinophils: 1.4 %
% Monocytes: 19.3 %
% Neutrophils: 63.7 %
Abs Basophils: 21 cells/uL (ref 0–200)
Abs Eosinophils: 42 cells/uL (ref 15–500)
Abs Lymphocytes: 447 cells/uL — ABNORMAL LOW (ref 850–3900)
Abs Monocytes: 579 cells/uL (ref 200–950)
Abs NRBC: 0 cells/uL
Abs Neutrophils: 1911 cells/uL (ref 1500–7800)
Hematocrit: 52.9 % — ABNORMAL HIGH (ref 38.5–50.0)
Hemoglobin: 17.2 g/dL — ABNORMAL HIGH (ref 13.2–17.1)
Lymphs: 14.9 %
MCH: 26.4 pg — ABNORMAL LOW (ref 27.0–33.0)
MCHC: 32.5 g/dL (ref 32.0–36.0)
MCV: 81.1 fL (ref 80.0–100.0)
MPV: 10.1 fL (ref 7.5–12.5)
Platelet Count: 262 10*3/uL (ref 140–400)
RBC Count: 6.52 10*6/uL — ABNORMAL HIGH (ref 4.20–5.80)
RDW: 16.7 % — ABNORMAL HIGH (ref 11.0–15.0)
WBC Count: 3 10*3/uL — ABNORMAL LOW (ref 3.8–10.8)

## 2020-01-02 LAB — BASIC METABOLIC PANEL (NA, K,
BUN/Creatinine Ratio: 14 (calc) (ref 6–22)
Calcium, total, Serum / Plasma: 10.4 mg/dL — ABNORMAL HIGH (ref 8.6–10.3)
Carbon Dioxide, Total: 22 mmol/L (ref 20–32)
Chloride, Serum / Plasma: 106 mmol/L (ref 98–110)
Creatinine, Serum / Plasma: 1.62 mg/dL — ABNORMAL HIGH (ref 0.60–1.35)
Glucose: 86 mg/dL (ref 65–99)
Potassium, Serum / Plasma: 3.5 mmol/L (ref 3.5–5.3)
Sodium, Serum / Plasma: 140 mmol/L (ref 135–146)
Urea Nitrogen, Serum / Plasma: 22 mg/dL (ref 7–25)
eGFR - high estimate: 59 mL/min/{1.73_m2} — ABNORMAL LOW (ref >=60)
eGFR - low estimate: 51 mL/min/{1.73_m2} — ABNORMAL LOW (ref >=60)

## 2020-01-02 LAB — CYTOMEGALOVIRUS DNA, QUANTITAT
CMV DNA, Quant. PCR: <200 IU/mL
CMV DNA, Quantitative PCR (log: <2.3 Log IU/mL

## 2020-01-02 LAB — PHOSPHORUS, SERUM / PLASMA: Phosphorus, Serum / Plasma: 4 mg/dL (ref 2.5–4.5)

## 2020-01-02 LAB — MAGNESIUM, SERUM / PLASMA: Magnesium, Serum / Plasma: 1.9 mg/dL (ref 1.5–2.5)

## 2020-01-02 LAB — TACROLIMUS, HIGHLY SENSITIVE,: Tacrolimus: 9.9 mcg/L

## 2020-01-05 NOTE — Telephone Encounter (Signed)
T/C made to pt re: this plan as message not read. Details note 12/31/19.

## 2020-01-07 NOTE — Progress Notes (Signed)
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 cPRA 10%     IS (per chart):  = Tacrolimus 11 mg/10 mg  = Mycophenolate 250 mg BID (low WBC)  = Prednisone 5 mg QD     Creatinine: 1.62, 12/30/19  UPC: 249 mg/g, 01/06/20; 805, 12/30/19; 276, 12/22/19    Next KTU appt: 02/25/2020 Sandi Carne, MD    Issues:  UA 01/06/20: Hgb 1+, protein 1+, RBC 3-10, bacteria few. UCx: <10,000 CFU of single gram pos organism.    RN action:  Sent to Dr. Michaelle Birks for review of UPC and UA/Cx results.    MD Plan of Care:  Per Dr. Michaelle Birks, repeat UA/UPC in one month.    RN action:  Order input at [facility] per Dr. Michaelle Birks, Req #: [phone]. On scheduled list for 02/05/20 or after. MyChart message sent to patient with instructions 7/24. Msg not read. T/C to patient 01/12/20, 11:11 AM.  Explained plan of care. Reports he's doing regular labs q2 wks but will plan for these labs per Dr. Michaelle Birks as noted next month. Patient expressed understanding and acceptance of instructions, and had no additional questions.

## 2020-01-08 LAB — URINALYSIS WITH MICROSCOPY
Bilirubin, Urine: NEGATIVE
Glucose, (UA): NEGATIVE
Hyaline Cast: NONE SEEN /LPF
Ketones, UA: NEGATIVE
Nitrite: NEGATIVE
Specific Gravity: 1.019 (ref 1.001–1.035)
Squam Epith Cells: NONE SEEN /HPF (ref <=5)
WBC Esterase: NEGATIVE
pH, UA: 6.5 (ref 5.0–8.0)

## 2020-01-08 LAB — URINE CULTURE ONLY, ROUTINE (E

## 2020-01-08 LAB — PROTEIN, TOTAL, RANDOM URINE
Creatinine, Random Urine: 197 mg/dL (ref 20–320)
Prot Concentration,UR: 49 mg/dL — ABNORMAL HIGH (ref 5–25)
Protein/Creat Ratio, Urine: 249 mg/g creat — ABNORMAL HIGH (ref 22–128)
Protein/Creatinine Ratio, Rand: 0.249 mg/mg creat — ABNORMAL HIGH (ref 0.022–0.128)

## 2020-01-15 LAB — COMPLETE BLOOD COUNT WITH DIFF
% Basophils: 0.9 %
% Eosinophils: 1.2 %
% Monocytes: 17.6 %
% Neutrophils: 62.7 %
Abs Basophils: 30 cells/uL (ref 0–200)
Abs Eosinophils: 40 cells/uL (ref 15–500)
Abs Lymphocytes: 581 cells/uL — ABNORMAL LOW (ref 850–3900)
Abs Monocytes: 581 cells/uL (ref 200–950)
Abs NRBC: 0 cells/uL
Abs Neutrophils: 2069 cells/uL (ref 1500–7800)
Hematocrit: 53.2 % — ABNORMAL HIGH (ref 38.5–50.0)
Hemoglobin: 16.8 g/dL (ref 13.2–17.1)
Lymphs: 17.6 %
MCH: 25.3 pg — ABNORMAL LOW (ref 27.0–33.0)
MCHC: 31.6 g/dL — ABNORMAL LOW (ref 32.0–36.0)
MCV: 80.2 fL (ref 80.0–100.0)
MPV: 10.2 fL (ref 7.5–12.5)
Platelet Count: 276 10*3/uL (ref 140–400)
RBC Count: 6.63 10*6/uL — ABNORMAL HIGH (ref 4.20–5.80)
RDW: 15.6 % — ABNORMAL HIGH (ref 11.0–15.0)
WBC Count: 3.3 10*3/uL — ABNORMAL LOW (ref 3.8–10.8)

## 2020-01-15 LAB — BASIC METABOLIC PANEL (NA, K,
BUN/Creatinine Ratio: 15 (calc) (ref 6–22)
Calcium, total, Serum / Plasma: 9.6 mg/dL (ref 8.6–10.3)
Carbon Dioxide, Total: 22 mmol/L (ref 20–32)
Chloride, Serum / Plasma: 106 mmol/L (ref 98–110)
Creatinine, Serum / Plasma: 1.72 mg/dL — ABNORMAL HIGH (ref 0.60–1.35)
Glucose: 80 mg/dL (ref 65–99)
Potassium, Serum / Plasma: 3.7 mmol/L (ref 3.5–5.3)
Sodium, Serum / Plasma: 143 mmol/L (ref 135–146)
Urea Nitrogen, Serum / Plasma: 26 mg/dL — ABNORMAL HIGH (ref 7–25)
eGFR - high estimate: 55 mL/min/{1.73_m2} — ABNORMAL LOW (ref >=60)
eGFR - low estimate: 47 mL/min/{1.73_m2} — ABNORMAL LOW (ref >=60)

## 2020-01-15 LAB — PROTEIN, TOTAL, RANDOM URINE
Creatinine, Random Urine: 168 mg/dL (ref 20–320)
Prot Concentration,UR: 171 mg/dL — ABNORMAL HIGH (ref 5–25)
Protein/Creat Ratio, Urine: 1018 mg/g creat — ABNORMAL HIGH (ref 22–128)
Protein/Creatinine Ratio, Rand: 1.018 mg/mg creat — ABNORMAL HIGH (ref 0.022–0.128)

## 2020-01-15 LAB — CYTOMEGALOVIRUS DNA, QUANTITAT
CMV DNA, Quant. PCR: <200 IU/mL
CMV DNA, Quantitative PCR (log: <2.3 Log IU/mL

## 2020-01-15 LAB — TACROLIMUS, HIGHLY SENSITIVE,: Tacrolimus: 12.8 mcg/L

## 2020-01-15 LAB — PHOSPHORUS, SERUM / PLASMA: Phosphorus, Serum / Plasma: 4.2 mg/dL (ref 2.5–4.5)

## 2020-01-15 LAB — MAGNESIUM, SERUM / PLASMA: Magnesium, Serum / Plasma: 1.5 mg/dL (ref 1.5–2.5)

## 2020-01-15 NOTE — Progress Notes (Signed)
87M, ESRD d/t FSGS/HTN, s/p DDRT 05/20/2019. cPRA 10%     IS (per chart):  = Tacrolimus 11 mg/10 mg   - Goal trough 7-9 ng/ml   - Current level 12.8, 01/13/20; 9.9, 12/30/19; 8.0, 12/24/19  = Mycophenolate 250 mg BID (low WBC)  = Prednisone 5 mg QD     Creatinine: 1.72, 01/13/20  UPC: 1018, 01/13/20; 249 mg/g, 01/06/20; 805, 12/30/19; 276, 12/22/19    Next KTU appt: 02/25/2020 Sandi Carne, MD    Issues:  Tac above goal. UPC increased again. Had planned to repeat UPC in 1 month but it was repeated in 1 week.    RN action:  T/C to patient 01/19/20, 2:20 PM. Verified dose 11 mg AM/10 mg PM, no med before lab, but may have taken late the night before, so <12 hrs. No new/changed meds except the 'antibiotic' he mentioned last time (can't find bottle, not sure it was, only took one pills). Has had some HA and insomnia lately, denies tremors/shaking.    Discussed UPC. Last week at visit with local Neph Dr. Janeece Riggers, BP 149/77.  Denies fevers or bladder/transplant pain, has continued urinary frequency at night. Did have some 'stinging' with urination 2 days ago, not since. UA 01/06/20 neg for infection.  Sent to Dr. Sharlee Blew for review.    MD Plan of Care:  Per Dr. Sharlee Blew, repeat the UA and UPC and make no changes in tacrolimus at this time.     RN action:  Reviewed and weekly orders have UPC. Order input at [facility] per Dr. Sharlee Blew, Debroah Baller #: [id number] with UA/reflex to cx.  T/C to patient 01/20/20, 9:09 AM.  Left PHI-sensitive vmail explaining general plan, let him know will send MyChart msg as well, request reply there or vmail to verify received.  No reply. T/C to patient 01/21/20, 11:45 AM. Explained plan of care in detail including need to request both weekly orders and one-time UA/Cx order at [facility]. He'll go tomorrow. Patient verbalized understanding and had no further questions.

## 2020-01-22 NOTE — Telephone Encounter (Signed)
Vmail from pt 01/22/20 that he wasn't able to go to the lab today as he had planned for these labs. T/C to patient 01/22/20, 5:14 PM. Advised to go next week. He reports no appt Monday so going Tues 01/26/20 when he has a lab appt. Provided ER precautions in case of severe UTI sxs now or in the future. (Pt had transient episode of dysuria only earlier this week).  Patient verbalized understanding and had no further questions.

## 2020-01-27 LAB — URINALYSIS COMPLETE, W/ REFLEX
Bacteria, UA: NONE SEEN /HPF
Bilirubin, Urine: NEGATIVE
Glucose, (UA): NEGATIVE
Hyaline Cast: NONE SEEN /LPF
Ketones, UA: NEGATIVE
Leukocyte Esterase: NEGATIVE
Nitrite: NEGATIVE
Specific Gravity: 1.023 (ref 1.001–1.035)
Squam Epith Cells: NONE SEEN /HPF (ref <=5)
WBCs, UR: NONE SEEN /HPF (ref <=5)
pH, UA: 5.5 (ref 5.0–8.0)

## 2020-01-27 NOTE — Progress Notes (Signed)
24M, ESRD d/t FSGS/HTN, s/p DDRT 05/20/2019. cPRA 10%     IS (per chart):  = Tacrolimus 11 mg/10 mg   - Goal trough 7-9 ng/ml   - Current level 10.0, 01/26/20; (not true trough): 12.8, 01/13/20; 9.9, 12/30/19; 8.0, 12/24/19  = Mycophenolate 250 mg BID (low WBC)  = Prednisone 5 mg QD     Creatinine: 1.57, 01/26/20  UPC: 331, 01/26/20; 1018, 01/13/20; 249, 01/06/20; 805, 12/30/19; 276, 12/22/19    Next KTU appt: 02/25/2020 Sandi Carne, MD    Issues:  Tac remains above goal. UPC lower. Hematuria on UA 01/26/20:  Hgb 2+, RBC 10-20. Some Hgb/RBCs on last two UAs in July also (one on admit for bx).    RN action:  T/C to patient 01/28/20, 6:14 PM. LVM requesting call back. No return call. T/C to patient 02/01/20, 9:46 AM. Verified dose 11 mg/10 mg, 12 hour level. Not sure about blood in urine (blindness), he will ask his son to check and report to him if anything seen. Feels hard under his surgical scar toward midline, not swollen, no tenderness/pain. Sent to Dr. Michaelle Birks for review.    MD Plan of Care:  Per Dr. Michaelle Birks, no change in tacrolimus dose for now. Repeat UA before next appointment in 02/2020.  If ongoing microhematuria or any gross hematuria will then refer to Urology. He already has recent imaging of native and transplant kidneys.    RN action:   Order input for UA at [facility] per Dr. Michaelle Birks, Req #: [phone] (scheduled, 9/1 or after).  T/C to patient 02/03/20, 8:52 AM. Explained plan for no tac change, repeat UA. He's doing labs 8/23 and again 9/6. Explained to do standing labs only this time, request to also add one time order for UA repeat for the following labs (and will need to ask at [facility] for this). Patient expressed understanding and acceptance of instructions, and had no additional questions.

## 2020-01-29 LAB — COMPLETE BLOOD COUNT WITH DIFF
% Basophils: 0.9 %
% Eosinophils: 1.2 %
% Monocytes: 14.4 %
% Neutrophils: 63.7 %
Abs Basophils: 30 cells/uL (ref 0–200)
Abs Eosinophils: 40 cells/uL (ref 15–500)
Abs Lymphocytes: 653 cells/uL — ABNORMAL LOW (ref 850–3900)
Abs Monocytes: 475 cells/uL (ref 200–950)
Abs NRBC: 0 cells/uL
Abs Neutrophils: 2102 cells/uL (ref 1500–7800)
Hematocrit: 53.7 % — ABNORMAL HIGH (ref 38.5–50.0)
Hemoglobin: 17.2 g/dL — ABNORMAL HIGH (ref 13.2–17.1)
Lymphs: 19.8 %
MCH: 25.7 pg — ABNORMAL LOW (ref 27.0–33.0)
MCHC: 32 g/dL (ref 32.0–36.0)
MCV: 80.4 fL (ref 80.0–100.0)
MPV: 10.9 fL (ref 7.5–12.5)
Platelet Count: 228 10*3/uL (ref 140–400)
RBC Count: 6.68 10*6/uL — ABNORMAL HIGH (ref 4.20–5.80)
RDW: 16.2 % — ABNORMAL HIGH (ref 11.0–15.0)
WBC Count: 3.3 10*3/uL — ABNORMAL LOW (ref 3.8–10.8)

## 2020-01-29 LAB — BASIC METABOLIC PANEL (NA, K,
BUN/Creatinine Ratio: 12 (calc) (ref 6–22)
Calcium, total, Serum / Plasma: 9.9 mg/dL (ref 8.6–10.3)
Carbon Dioxide, Total: 23 mmol/L (ref 20–32)
Chloride, Serum / Plasma: 106 mmol/L (ref 98–110)
Creatinine, Serum / Plasma: 1.57 mg/dL — ABNORMAL HIGH (ref 0.60–1.35)
Glucose: 92 mg/dL (ref 65–99)
Potassium, Serum / Plasma: 3.5 mmol/L (ref 3.5–5.3)
Sodium, Serum / Plasma: 140 mmol/L (ref 135–146)
Urea Nitrogen, Serum / Plasma: 19 mg/dL (ref 7–25)
eGFR - high estimate: 61 mL/min/{1.73_m2} (ref >=60)
eGFR - low estimate: 53 mL/min/{1.73_m2} — ABNORMAL LOW (ref >=60)

## 2020-01-29 LAB — TACROLIMUS, HIGHLY SENSITIVE,: Tacrolimus: 10 mcg/L

## 2020-01-29 LAB — PROTEIN, TOTAL, RANDOM URINE
Creatinine, Random Urine: 260 mg/dL (ref 20–320)
Prot Concentration,UR: 86 mg/dL — ABNORMAL HIGH (ref 5–25)
Protein/Creat Ratio, Urine: 331 mg/g creat — ABNORMAL HIGH (ref 22–128)
Protein/Creatinine Ratio, Rand: 0.331 mg/mg creat — ABNORMAL HIGH (ref 0.022–0.128)

## 2020-01-29 LAB — MAGNESIUM, SERUM / PLASMA: Magnesium, Serum / Plasma: 1.8 mg/dL (ref 1.5–2.5)

## 2020-01-29 LAB — CYTOMEGALOVIRUS DNA, QUANTITAT
CMV DNA, Quant. PCR: <200 IU/mL
CMV DNA, Quantitative PCR (log: <2.3 Log IU/mL

## 2020-01-29 LAB — PHOSPHORUS, SERUM / PLASMA: Phosphorus, Serum / Plasma: 4.1 mg/dL (ref 2.5–4.5)

## 2020-02-12 LAB — URINALYSIS COMPLETE, W/ REFLEX
Bacteria, UA: NONE SEEN /HPF
Bilirubin, Urine: NEGATIVE
Glucose, (UA): NEGATIVE
Hyaline Cast: NONE SEEN /LPF
Ketones, UA: NEGATIVE
Leukocyte Esterase: NEGATIVE
Nitrite: NEGATIVE
Specific Gravity: 1.019 (ref 1.001–1.035)
Squam Epith Cells: NONE SEEN /HPF (ref <=5)
WBCs, UR: NONE SEEN /HPF (ref <=5)
pH, UA: 6 (ref 5.0–8.0)

## 2020-02-12 LAB — TACROLIMUS, HIGHLY SENSITIVE,: Tacrolimus: 9.3 mcg/L

## 2020-02-12 LAB — COMPLETE BLOOD COUNT WITH DIFF
% Basophils: 0.7 %
% Eosinophils: 1.4 %
% Monocytes: 17.2 %
% Neutrophils: 60.4 %
Abs Basophils: 20 cells/uL (ref 0–200)
Abs Eosinophils: 41 cells/uL (ref 15–500)
Abs Lymphocytes: 589 cells/uL — ABNORMAL LOW (ref 850–3900)
Abs Monocytes: 499 cells/uL (ref 200–950)
Abs NRBC: 0 cells/uL
Abs Neutrophils: 1752 cells/uL (ref 1500–7800)
Hematocrit: 53.1 % — ABNORMAL HIGH (ref 38.5–50.0)
Hemoglobin: 17.1 g/dL (ref 13.2–17.1)
Lymphs: 20.3 %
MCH: 25 pg — ABNORMAL LOW (ref 27.0–33.0)
MCHC: 32.2 g/dL (ref 32.0–36.0)
MCV: 77.5 fL — ABNORMAL LOW (ref 80.0–100.0)
MPV: 10.6 fL (ref 7.5–12.5)
Platelet Count: 266 10*3/uL (ref 140–400)
RBC Count: 6.85 10*6/uL — ABNORMAL HIGH (ref 4.20–5.80)
RDW: 15.5 % — ABNORMAL HIGH (ref 11.0–15.0)
WBC Count: 2.9 10*3/uL — ABNORMAL LOW (ref 3.8–10.8)

## 2020-02-12 LAB — PROTEIN, TOTAL, RANDOM URINE
Creatinine, Random Urine: 206 mg/dL (ref 20–320)
Prot Concentration,UR: 46 mg/dL — ABNORMAL HIGH (ref 5–25)
Protein/Creat Ratio, Urine: 223 mg/g creat — ABNORMAL HIGH (ref 22–128)
Protein/Creatinine Ratio, Rand: 0.223 mg/mg creat — ABNORMAL HIGH (ref 0.022–0.128)

## 2020-02-12 LAB — BASIC METABOLIC PANEL (NA, K,
BUN/Creatinine Ratio: 15 (calc) (ref 6–22)
Calcium, total, Serum / Plasma: 10 mg/dL (ref 8.6–10.3)
Carbon Dioxide, Total: 23 mmol/L (ref 20–32)
Chloride, Serum / Plasma: 105 mmol/L (ref 98–110)
Creatinine, Serum / Plasma: 1.62 mg/dL — ABNORMAL HIGH (ref 0.60–1.35)
Glucose: 87 mg/dL (ref 65–99)
Potassium, Serum / Plasma: 3.5 mmol/L (ref 3.5–5.3)
Sodium, Serum / Plasma: 140 mmol/L (ref 135–146)
Urea Nitrogen, Serum / Plasma: 24 mg/dL (ref 7–25)
eGFR - high estimate: 59 mL/min/{1.73_m2} — ABNORMAL LOW (ref >=60)
eGFR - low estimate: 51 mL/min/{1.73_m2} — ABNORMAL LOW (ref >=60)

## 2020-02-12 LAB — CYTOMEGALOVIRUS DNA, QUANTITAT
CMV DNA, Quant. PCR: <200 IU/mL
CMV DNA, Quantitative PCR (log: <2.3 Log IU/mL

## 2020-02-12 LAB — PHOSPHORUS, SERUM / PLASMA: Phosphorus, Serum / Plasma: 3.7 mg/dL (ref 2.5–4.5)

## 2020-02-12 LAB — MAGNESIUM, SERUM / PLASMA: Magnesium, Serum / Plasma: 1.7 mg/dL (ref 1.5–2.5)

## 2020-02-24 NOTE — Progress Notes (Signed)
7M, ESRD d/t FSGS/HTN, s/p DDRT 05/20/2019. cPRA 10%     Next KTU appt: 02/25/2020 Sandi Carne, MD    Issue: Vmail from pt asking about extra lab.    RN action:  T/C to patient 02/24/20, 5:30 PM. LVM letting him know unsure what extra lab he's referring to; possibly the UA that was to be repeated before visit w/Dr. Michaelle Birks which ended up being done along with other labs on 8/24. Reminded of Tel visit 8:40 AM tomorrow, can discuss all issues with Dr. Michaelle Birks. Provided clinic # for call back if needed.

## 2020-02-25 NOTE — Progress Notes (Deleted)
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      Immunosuppressive management encounter following kidney transplant  cPRA 10%.  Tacrolimus11 mgin am and 10 mg in pmwith goal trough 7-9ng/ml., tac reduce to 10/10 , target 6-8 ng.ml   MMF250 mgbid ( reducedfor leucopenia, WBC improving), increase to 500 mg bid   Continue prednisone5 mg daily.    At risk for opportunistic infections  Continue Septra MWF until 12/2/2021for toxo prophylaxis.    HTN (hypertension)  BP controlled onamlodipine 10 mg po daily.    Leucopenia  Improved.WBC 2.9 He is onreduced dose cellcept.      Hypokalemia : High potassium diet advised.    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  CT imaging showed no concerning renal lesions.    Microhematuria : consider cystoscopy.

## 2020-02-27 LAB — MAGNESIUM, SERUM / PLASMA: Magnesium, Serum / Plasma: 1.7 mg/dL (ref 1.5–2.5)

## 2020-02-27 LAB — CYTOMEGALOVIRUS DNA, QUANTITAT
CMV DNA, Quant. PCR: <200 IU/mL
CMV DNA, Quantitative PCR (log: <2.3 Log IU/mL

## 2020-02-27 LAB — BASIC METABOLIC PANEL (NA, K,
BUN/Creatinine Ratio: 13 (calc) (ref 6–22)
Calcium, total, Serum / Plasma: 9.5 mg/dL (ref 8.6–10.3)
Carbon Dioxide, Total: 25 mmol/L (ref 20–32)
Chloride, Serum / Plasma: 105 mmol/L (ref 98–110)
Creatinine, Serum / Plasma: 1.83 mg/dL — ABNORMAL HIGH (ref 0.60–1.35)
Glucose: 79 mg/dL (ref 65–139)
Potassium, Serum / Plasma: 3.2 mmol/L — ABNORMAL LOW (ref 3.5–5.3)
Sodium, Serum / Plasma: 142 mmol/L (ref 135–146)
Urea Nitrogen, Serum / Plasma: 24 mg/dL (ref 7–25)
eGFR - high estimate: 51 mL/min/{1.73_m2} — ABNORMAL LOW (ref >=60)
eGFR - low estimate: 44 mL/min/{1.73_m2} — ABNORMAL LOW (ref >=60)

## 2020-02-27 LAB — PROTEIN, TOTAL, RANDOM URINE
Creatinine, Random Urine: 252 mg/dL (ref 20–320)
Prot Concentration,UR: 91 mg/dL — ABNORMAL HIGH (ref 5–25)
Protein/Creat Ratio, Urine: 361 mg/g creat — ABNORMAL HIGH (ref 22–128)
Protein/Creatinine Ratio, Rand: 0.361 mg/mg creat — ABNORMAL HIGH (ref 0.022–0.128)

## 2020-02-27 LAB — COMPLETE BLOOD COUNT WITH DIFF
% Basophils: 0.6 %
% Eosinophils: 0.6 %
% Monocytes: 14.2 %
% Neutrophils: 61.4 %
Abs Basophils: 21 cells/uL (ref 0–200)
Abs Eosinophils: 21 cells/uL (ref 15–500)
Abs Lymphocytes: 812 cells/uL — ABNORMAL LOW (ref 850–3900)
Abs Monocytes: 497 cells/uL (ref 200–950)
Abs NRBC: 0 cells/uL
Abs Neutrophils: 2149 cells/uL (ref 1500–7800)
Hematocrit: 54 % — ABNORMAL HIGH (ref 38.5–50.0)
Hemoglobin: 17.3 g/dL — ABNORMAL HIGH (ref 13.2–17.1)
Lymphs: 23.2 %
MCH: 25.6 pg — ABNORMAL LOW (ref 27.0–33.0)
MCHC: 32 g/dL (ref 32.0–36.0)
MCV: 80 fL (ref 80.0–100.0)
MPV: 10.4 fL (ref 7.5–12.5)
Platelet Count: 284 10*3/uL (ref 140–400)
RBC Count: 6.75 10*6/uL — ABNORMAL HIGH (ref 4.20–5.80)
RDW: 15.1 % — ABNORMAL HIGH (ref 11.0–15.0)
WBC Count: 3.5 10*3/uL — ABNORMAL LOW (ref 3.8–10.8)

## 2020-02-27 LAB — PHOSPHORUS, SERUM / PLASMA: Phosphorus, Serum / Plasma: 4.2 mg/dL (ref 2.5–4.5)

## 2020-02-27 LAB — TACROLIMUS, HIGHLY SENSITIVE,: Tacrolimus: 7.7 mcg/L

## 2020-03-01 NOTE — Progress Notes (Signed)
39M, ESRD d/t FSGS/HTN, s/p DDRT 05/20/2019. cPRA 10%      IS (per chart):  = Tacrolimus 11 mg/10 mg   - Goal trough 7-9 ng/ml   - Current level 7.7, 02/24/20  = Mycophenolate 250 mg BID (low WBC)  = Prednisone 5 mg QD     Creatinine: 1.83, 02/24/20    Next KTU appt: TBD after no show 02/25/20    Issue:  Vmail from pt with questions re: supplements, tea and surgery.    RN action:  T/C to patient 03/02/20, 10:21 AM.  Pt wants to work on weight loss. Wondering about "dietary tea", if safe. Let him know we can review if we have a photo or list of the ingredients. He will get this and send to [place] before taking anything.  Was told by his local MD that he's "close to diabetes". He wonders about gastric bypass and if safe for him.  Let him know will review with our [facility] Nephrologist and get back to him. He's also amenable to a meeting with a [place] dietician.     Discussed no-show last week; he was not aware of appt; his son was not checking MyChart. Encouraged pt to ensure MyChart checked regularly so he does not miss visits. He will do this.    Referral made to K. Sena Hitch, Outpatient Dietician, for guidance on post-[place] nutrition. Request sent to front desk team to schedule pt after no-show. Sent to Dr. Michaelle Birks for review of gastric bypass question.    MD Plan of Care:  Per Dr. Michaelle Birks, start with nutritional guidance. Plan to discuss weight loss surgery at clinic visit.    RN action:  T/C to patient 03/03/20, 10:51 AM. Let him know to discuss gastric bypass w/Dr. Michaelle Birks at visit (now resched 9/27). Patient verbalized understanding and had no further questions.

## 2020-03-14 ENCOUNTER — Ambulatory Visit: Admit: 2020-03-16 | Discharge: 2020-04-16 | Payer: MEDICARE | Attending: Nephrology | Primary: Physician

## 2020-03-14 DIAGNOSIS — E876 Hypokalemia: Secondary | ICD-10-CM

## 2020-03-14 DIAGNOSIS — Z940 Kidney transplant status: Secondary | ICD-10-CM

## 2020-03-14 DIAGNOSIS — D751 Secondary polycythemia: Secondary | ICD-10-CM

## 2020-03-14 DIAGNOSIS — Z79899 Other long term (current) drug therapy: Secondary | ICD-10-CM

## 2020-03-14 DIAGNOSIS — I10 Essential (primary) hypertension: Secondary | ICD-10-CM

## 2020-03-14 MED ORDER — LOSARTAN 25 MG TABLET
25 | ORAL_TABLET | Freq: Every day | ORAL | 11 refills | Status: DC
Start: 2020-03-14 — End: 2020-03-14

## 2020-03-14 MED ORDER — MYCOPHENOLATE MOFETIL 250 MG CAPSULE
250 | ORAL_CAPSULE | ORAL | 11 refills | 30.00000 days | Status: AC
Start: 2020-03-14 — End: 2020-08-02

## 2020-03-14 MED ORDER — LOSARTAN 25 MG TABLET
25 | ORAL_TABLET | Freq: Every day | ORAL | 11 refills | Status: DC
Start: 2020-03-14 — End: 2020-05-19

## 2020-03-14 NOTE — Progress Notes (Signed)
POST-KIDNEY TRANSPLANT NEPHROLOGY PROGRESS NOTE         Subjective      Chief Complaint   Patient presents with    Kidney Transplant Follow-up       History of Present Illness:  [name] is a 44 y.o. year old male S/P kidney transplant in 05/20/2019 being seen for a follow-up visit.    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 Acute tubular epithelia injury; see comment.   2. No evidence of rejection, negative C4d stain.   2. No significant tubulointerstitial chronicity.   3. Moderate arteriosclerosis, donor-derived.     Special Stain Summary: (x8) PAS and trichrome special stains were  performed and examined to confirm the diagnosis. The findings are  consistent with our H&E-based light microscopic impression. Six  additional PAS-stained level sections were evaluated, and no segmentally  sclerotic glomeruli are identified.     DSAs not done      Immunosuppressive management encounter following kidney transplant 05/21/2019     Induction agent/dose: Thymo lite  Regime: Tac/MMF  Corticosteroid plan: maintenance   cPRA: 10%      Transplant follow-up  05/21/2019     Category Problem Type of follow-up needed in clinic? Ordered? Scheduled? Comments   Nursing (e.g., drain/ Foley) HD tunneled line refer to IR for removal when appropriate Needs to be done in clinic weekly dressing changes in HD clinic until can be removed   Lab/ Micro Standard Tac Follow up results   Needs to be ordered None   Imaging studies/ procedures NA NA NA None   Referrals Uroglogy for stent removal NA Already ordered None   Delayed graft function and improving UOP, monitor for resolution        Ureteral stent retained of kidney transplant (CMS code) 05/21/2019     Implant Name Type Inv. Item Serial No. Manufacturer Lot No. LRB No. Used Action   STENT URETHRAL 6FR X 16CM DOUBLE J   [id number] - [id number] Stents-Non Des STENT URETHRAL 6FR X 16CM DOUBLE J   [id number]   [id number] Right 1 Implanted     Referral placed for outpatient stent removal    07/01/19 stent removed.       At risk for opportunistic infections 05/21/2019     Transplant Serology Workup:  Recipient        Lab Results   Component Value Date    HBCAB NEG 12/17/2018    HBSAG NEG 12/17/2018    HBABQ >800 12/17/2018    HCV NEG 12/17/2018    RPR Nonreactive 12/17/2018    CMVAB POS (  A) 12/17/2018    VCAM NEG 12/17/2018    EBNA POS (A) 12/17/2018    TOXO NEG 12/17/2018         Donor        Cadaver Donor UNOS ID (no units)   Date Value   05/20/2019 [id number]     Match ID: [id number]          Induction Plan:   Thymo + steroid maintenance     Plan for PPX:   Toxo prophylaxis: (D +/R - Toxo MISMATCH), Septra DS 1 tab PO Daily x 1 month (06/19/2019), then qMWF x 11 months more (05/19/2020) -- 1 year total for Toxo Mismatch   PCP prophylaxis: Septra DS as per above for Toxo ppx   CMV prophylaxis: (D +/R +), Valcyte 900 mg PO Daily (adjusted for renal function) x 3 months (08/18/2019)   Fungal prophylaxis: Fluconazole 100 mg PO q7days x 1 month (06/18/2019)  HBV prophylaxis: Not necessary            At risk of infection transmitted from donor  05/21/2019     Patient underwent transplant with no known donor derived risk factors: Donor is not CDC high risk, not HBV core ab + and there are no known donor derived infections. No need for treatment or monitoring beyond the usual opportunistic infection prophylaxis per protocol. Donor with no known malignancies.  The donor's CMV status is IgG positive and the recipient's CMV status is IgG positive.  Prophylaxis will be determined by risk of CMV transmission.                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     Dialysis Type: HD Hemodialysis  Dialysis Center: [facility]         Phone: [phone]        Fax: [phone]       Address: 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  Been on HD since 2010.  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                  Objective        .      Review of Prior Testing  I personally reviewed data as noted below including the following:    1. Metabolic panel  2. Complete blood count  3. Urine chemistry including level of proteinuria  4. Level of immunosuppressive drug (tacrolimus).          Lab Results   Component Value Date    Creatinine 1.51 (H) 12/24/2019    Creatinine 2.12 (H) 06/15/2019    Creatinine 2.52 (H) 06/09/2019    Creatinine, Serum / Plasma 1.83 (H) 02/24/2020    Creatinine, Serum / Plasma 1.62 (H) 02/09/2020    Creatinine,  Serum / Plasma 1.57 (H) 01/26/2020    Urea  Nitrogen, Serum / Plasma 24 02/24/2020    Urea Nitrogen, Serum / Plasma 24 02/09/2020    Urea Nitrogen, Serum / Plasma 19 01/26/2020    Sodium, Serum / Plasma 142 02/24/2020    Sodium, Serum / Plasma 140 02/09/2020    Sodium, Serum / Plasma 140 01/26/2020    Potassium, Serum / Plasma 3.2 (L) 02/24/2020    Potassium, Serum / Plasma 3.5 02/09/2020    Potassium, Serum / Plasma 3.5 01/26/2020    Carbon Dioxide, Total 25 02/24/2020    Carbon Dioxide, Total 23 02/09/2020    Carbon Dioxide, Total 23 01/26/2020    Calcium, total, Serum / Plasma 9.5 02/24/2020    Calcium, total, Serum / Plasma 10.0 02/09/2020    Calcium, total, Serum / Plasma 9.9 01/26/2020    Phosphorus, Serum / Plasma 4.2 02/24/2020    Phosphorus, Serum / Plasma 3.7 02/09/2020    Phosphorus, Serum / Plasma 4.1 01/26/2020     Lab Results   Component Value Date    WBC Count 3.5 (L) 02/24/2020    WBC Count 2.9 (L) 02/09/2020    WBC Count 3.3 (L) 01/26/2020    Hemoglobin 17.3 (H) 02/24/2020    Hemoglobin 17.1 02/09/2020    Hemoglobin 17.2 (H) 01/26/2020    Hematocrit 54.0 (H) 02/24/2020    Hematocrit 53.1 (H) 02/09/2020    Hematocrit 53.7 (H) 01/26/2020    MCV 80.0 02/24/2020    MCV 77.5 (L) 02/09/2020    MCV 80.4 01/26/2020    Platelet Count 284 02/24/2020    Platelet Count 266 02/09/2020    Platelet Count 228 01/26/2020       Lab Results   Component Value Date    Tacrolimus 7.7 02/24/2020    Tacrolimus 9.3 02/09/2020    Tacrolimus 10.0 01/26/2020    Tacrolimus 5.9 06/15/2019    Tacrolimus 5.2 06/09/2019    Tacrolimus 5.0 06/04/2019          Assessment and Plan     Deceased-donor kidney transplant recipient  S/p DDRT 05/20/2019; 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    Immunosuppressive management encounter following kidney transplant  cPRA 10%.  Tacrolimus11 mgin am and 10 mg in pmwith goal trough 7-9ng/ml.  Increase MMF to 250 mg in am and 500  mg in pm.  Continue prednisone5 mg daily.  Results of allograft biopsy showed no rejection, DSA neg    At risk for opportunistic infections  Continue Septra MWF until 12/2/2021for toxo prophylaxis.    HTN (hypertension)  BP controlled     Microhematuria:   He had microhematuria , closer to when he had biopsy.  Had CT imaging showing no renal lesions.  Repeat UA   If persistent microhematuria will refer to urology.      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  Repeat labs in 1 week.    Leucopenia  Improved.        Hypokalemia : High potassium diet advised.  He is now on losartan as well.    I discussed about COVID 3 rd dose and influenza vaccine.      Visit Diagnoses and Associated Orders Placed:  1. Post-renal transplant erythrocytosis     2. Deceased-donor kidney transplant recipient  mycophenolate (CELLCEPT) 250 mg capsule    losartan (COZAAR) 25 mg tablet  Urinalysis with Microscopy    Urinalysis with Microscopy    Tacrolimus, Highly Sensitive, LC/MS/MS [facility] (External Lab)    Tacrolimus, Highly Sensitive, LC/MS/MS [facility] (External Lab)    Basic Metabolic Panel - [place]/[facility] (NA, K, CL, CO2, BUN, CR, GLU, CA)    Basic Metabolic Panel - [place]/[facility] (NA, K, CL, CO2, BUN, CR, GLU, CA)    DISCONTINUED: losartan (COZAAR) 25 mg tablet   3. Immunosuppressive management encounter following kidney transplant  mycophenolate (CELLCEPT) 250 mg capsule      Orders Placed This Encounter    Urinalysis with Microscopy     Standing Status:   Future     Number of Occurrences:   1     Standing Expiration Date:   03/14/2021     Order Specific Question:   Container details     Answer:   Urine cup    Tacrolimus, Highly Sensitive, LC/MS/MS [facility] (External Lab)     Standing Status:   Future     Number of Occurrences:   1     Standing Expiration Date:   03/14/2021    Basic Metabolic Panel - [place]/[facility] (NA, K, CL, CO2, BUN, CR, GLU, CA)     Standing Status:   Future     Number of  Occurrences:   1     Standing Expiration Date:   03/14/2021     Order Specific Question:   Container details     Answer:   Light green top preferred, gold top acceptable    mycophenolate (CELLCEPT) 250 mg capsule     Sig: 07/21/19: Take CellCept 1 pill (250 mg) in the AM and 2 pills (500 mg) in the PM,  "or as directed"     Dispense:  60 capsule     Refill:  11     Z94.0, TXP: 05/20/2019 (Kidney) ==DOSE DECR==fill on patient request only    losartan (COZAAR) 25 mg tablet     Sig: Take 1 tablet (25 mg total) by mouth daily     Dispense:  30 tablet     Refill:  11       .       Health Care Maintenance:   Recommend usual age- and sex-appropriate cancer screening plus annual dermatology visit for skin cancer surveillance   Recommend age and disease-appropriate vaccinations (killed vaccines only) including annual influenza vaccine. Live vaccines are contraindicated.     Risk Assessment: The patient is at increased risk of complications including drug toxicity, infection and malignancy as a result of immunosuppression for his kidney transplant and requires close monitoring of immunosuppression levels and dose adjustment to manage toxicities.   There is no evidence of immunosuppressive drug toxicity at this visit.                        The patient requested this visit. The visit is in place of a face to face visit. The patient does not have access to or the ability to use the tools necessary to participate in a video visit and therefore consented to conduct this visit by telephone only. I spent a total of 22 minutes in audio communication with this patient.

## 2020-03-17 LAB — "BASIC METABOLIC PANEL (NA, K, "
BUN/Creatinine Ratio: 14 (calc) (ref 6–22)
Calcium, total, Serum / Plasma: 10 mg/dL (ref 8.6–10.3)
Carbon Dioxide, Total: 24 mmol/L (ref 20–32)
Chloride, Serum / Plasma: 106 mmol/L (ref 98–110)
Creatinine, Serum / Plasma: 1.61 mg/dL — ABNORMAL HIGH (ref 0.60–1.35)
Glucose: 84 mg/dL (ref 65–139)
Potassium, Serum / Plasma: 3.2 mmol/L — ABNORMAL LOW (ref 3.5–5.3)
Sodium, Serum / Plasma: 142 mmol/L (ref 135–146)
Urea Nitrogen, Serum / Plasma: 23 mg/dL (ref 7–25)
eGFR - high estimate: 59 mL/min/{1.73_m2} — ABNORMAL LOW
eGFR - low estimate: 51 mL/min/{1.73_m2} — ABNORMAL LOW

## 2020-03-17 LAB — URINALYSIS WITH MICROSCOPY
Bacteria, UA: NONE SEEN /HPF
Bilirubin, Urine: NEGATIVE
Glucose, (UA): NEGATIVE
Hyaline Cast: NONE SEEN /LPF
Ketones, UA: NEGATIVE
Nitrite: NEGATIVE
Specific Gravity: 1.02 (ref 1.001–1.035)
Squam Epith Cells: NONE SEEN /HPF
WBC Esterase: NEGATIVE
WBCs, UR: NONE SEEN /HPF
pH, UA: 6 (ref 5.0–8.0)

## 2020-03-17 LAB — "TACROLIMUS, HIGHLY SENSITIVE, ": Tacrolimus: 8.9 mcg/L

## 2020-03-17 MED ORDER — PREDNISONE 5 MG TABLET
5 | ORAL_TABLET | Freq: Every day | ORAL | 11 refills | 30.00000 days | Status: AC
Start: 2020-03-17 — End: 2020-07-21

## 2020-03-17 NOTE — Progress Notes (Signed)
52M, ESRD d/t FSGS/HTN, s/p DDRT 05/20/2019. cPRA 10% KDPI 38%    IS Medications: Tacrolimus 10 mg/10 mg, goal 7-9, MMF 250 mg BID (d/t low WBC), Pred 5 mg BID    Creatinine: 1.67    Next K[facility] appt: 04/18/2020 Sandi Carne, MD    Issues:  Hematuria on UA 03/16/20:  Hgb 2+, RBC 3-10. Prev  Hgb 2+, RBC 20-40.     RN action:  Note to Dr.   Per 02/25/20 K[facility] note: microhematuria: consider cystoscopy  Per 01/27/20 [facility] plan:  If ongoing microhematuria or any gross hematuria will then refer to Urology.     Dr. Daryll Drown Plan of Care:  Repeat UA still shows microhematuria. Ok to refer to urology.     RN action:  MC msg to pt w/ plan-of-care.     Referral pended for Dr. Daryll Drown signature.

## 2020-03-18 NOTE — Telephone Encounter (Signed)
Per Dr. Michaelle Birks post transplant erythrocytosis    Referral pended for Dr. Daryll Drown signature.

## 2020-03-20 LAB — COMPLETE BLOOD COUNT WITH DIFF
% Basophils: 0.9 %
% Eosinophils: 0.6 %
% Monocytes: 15.2 %
% Neutrophils: 68.1 %
Abs Basophils: 31 cells/uL (ref 0–200)
Abs Eosinophils: 20 cells/uL (ref 15–500)
Abs Lymphocytes: 517 cells/uL — ABNORMAL LOW (ref 850–3900)
Abs Monocytes: 517 cells/uL (ref 200–950)
Abs Neutrophils: 2315 cells/uL (ref 1500–7800)
Hematocrit: 55.8 % — ABNORMAL HIGH (ref 38.5–50.0)
Hemoglobin: 18.2 g/dL — ABNORMAL HIGH (ref 13.2–17.1)
Lymphs: 15.2 %
MCH: 25.5 pg — ABNORMAL LOW (ref 27.0–33.0)
MCHC: 32.6 g/dL (ref 32.0–36.0)
MCV: 78.2 fL — ABNORMAL LOW (ref 80.0–100.0)
MPV: 10.4 fL (ref 7.5–12.5)
Platelet Count: 334 10*3/uL (ref 140–400)
RBC Count: 7.14 10*6/uL — ABNORMAL HIGH (ref 4.20–5.80)
RDW: 16.2 % — ABNORMAL HIGH (ref 11.0–15.0)
WBC Count: 3.4 10*3/uL — ABNORMAL LOW (ref 3.8–10.8)

## 2020-03-20 LAB — PROTEIN, TOTAL, RANDOM URINE
Creatinine, Random Urine: 215 mg/dL (ref 20–320)
Prot Concentration,UR: 81 mg/dL — ABNORMAL HIGH (ref 5–25)
Protein/Creat Ratio, Urine: 377 mg/g creat — ABNORMAL HIGH (ref 22–128)
Protein/Creatinine Ratio, Rand: 0.377 mg/mg creat — ABNORMAL HIGH (ref 0.022–0.128)

## 2020-03-20 LAB — CYTOMEGALOVIRUS DNA, QUANTITAT
CMV DNA, Quant. PCR: NOT DETECTED IU/mL
CMV DNA, Quantitative PCR (log: NOT DETECTED Log IU/mL

## 2020-03-20 LAB — "BASIC METABOLIC PANEL (NA, K, "
BUN/Creatinine Ratio: 13 (calc) (ref 6–22)
Calcium, total, Serum / Plasma: 10.2 mg/dL (ref 8.6–10.3)
Carbon Dioxide, Total: 28 mmol/L (ref 20–32)
Chloride, Serum / Plasma: 106 mmol/L (ref 98–110)
Creatinine, Serum / Plasma: 1.67 mg/dL — ABNORMAL HIGH (ref 0.60–1.35)
Glucose: 85 mg/dL (ref 65–139)
Potassium, Serum / Plasma: 3.3 mmol/L — ABNORMAL LOW (ref 3.5–5.3)
Sodium, Serum / Plasma: 141 mmol/L (ref 135–146)
Urea Nitrogen, Serum / Plasma: 22 mg/dL (ref 7–25)
eGFR - high estimate: 57 mL/min/{1.73_m2} — ABNORMAL LOW
eGFR - low estimate: 49 mL/min/{1.73_m2} — ABNORMAL LOW

## 2020-03-20 LAB — PHOSPHORUS, SERUM / PLASMA: Phosphorus, Serum / Plasma: 2.9 mg/dL (ref 2.5–4.5)

## 2020-03-20 LAB — MAGNESIUM, SERUM / PLASMA: Magnesium, Serum / Plasma: 1.9 mg/dL (ref 1.5–2.5)

## 2020-03-20 LAB — "TACROLIMUS, HIGHLY SENSITIVE, ": Tacrolimus: 9.2 mcg/L

## 2020-03-21 ENCOUNTER — Ambulatory Visit: Discharge: 2020-05-25 | Payer: MEDICARE | Attending: Physician | Primary: Physician

## 2020-03-21 DIAGNOSIS — R319 Hematuria, unspecified: Secondary | ICD-10-CM

## 2020-03-21 NOTE — Progress Notes (Signed)
Urology New Patient Note    History of Present Illness: Cody Myers was seen in the Urology faculty practice clinic at [facility] as a new patient in a consultation at the request of Dr. Michaelle Birks for evaluation of hematuria.    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 He has a history of microscopic hematuria dating back to July 2021. Of note he had a kidney biopsy at the time, but hematuria did not resolve a month later.     He is legally blind and unable to state whether he has seen any gross hematuria.     Denies any history of UTI. He had gonorrhea 4 years ago, but was treated.     He has a history tobacco use; patient reports approximately 17 pack years of tobacco use, quit 11 years.  Denies history of other exposures to toxins, pesticides, chemicals, heavy metals. Denies history of GU Cancers.    Had a cyst on testicle several years ago that was evaluated by urology and found to be benign    He was on dialysis for 11 years (last Jul 01 2019) and has known cysts on native kidneys    The patient's past medical, surgical, medication, allergy, family, and social histories were reviewed and noncontributory to this illness/condition except as noted below:    The patient's past medical history is notable for:  Past Medical History:   Diagnosis Date    Asthma     Inhalers    Back pain     Blood transfusion without reported diagnosis     BMI 35.0-35.9,adult     Height 6 foot even - weight 260 pounds for BMI 36.      Chronic kidney disease 2009    ESRD due FSGS / DM.    Need to clarify FSGS by biopsy.  Noted in evaluation with [facility] but no further documentation of FSGS by Nephrology.     Depression     not taking medication    GERD (gastroesophageal reflux disease)     Gout     Heart murmur     Hypercholesterolemia     Hypertension     2018 BP dropping on dialysis - MD ordered Midodrine 5 mg for SBD <100    Legally blind 2010    Conjunctiva disorder with prolapse right eye.  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    MRSA (methicillin resistant Staphylococcus aureus)     Sleep apnea     Thromboembolism (CMS code)     Dialysis graft only        The patient's past surgical history is notable for:  Past Surgical History:   Procedure Laterality Date    AV FISTULA PLACEMENT Right 2016    BACK SURGERY      Bilateral Eye Surgery      CENTRAL VENOUS CATHETER  2015    Place and removed once fistula working    COLONOSCOPY  03/2018    Negative for polyps - no specimens taken    DIALYSIS FISTULA CREATION Left 2010    Revision 2014    IR DIALYSIS FISTULAGRAM (ORDERABLE BY IR SERVICE ONLY)  05/22/2019    IR DIALYSIS FISTULAGRAM (ORDERABLE BY IR SERVICE ONLY) 05/22/2019 Jenita Seashore, MD RAD IR [facility]    TOOTH EXTRACTION  2018    Wound Vac  2015    Dehiscence anterior torso - chest       The patient is  currently taking the following medications:  Current Outpatient Medications   Medication Sig Dispense Refill    acetaminophen (TYLENOL) 500 mg tablet Take 2 tablets (1,000 mg total) by mouth every 8 (eight) hours as needed for Pain (mild pain) Max = 3000 mg/day 50 tablet 0    albuterol 90 mcg/actuation metered dose inhaler Inhale 1-2 puffs into the lungs every 6 (six) hours as needed for Wheezing or Shortness of Breath         amLODIPine (NORVASC) 10 mg tablet TAKE 1 TABLET(10 MG) BY MOUTH DAILY 30 tablet 5    docusate sodium (COLACE) 250 mg capsule Take 1 capsule (250 mg total) by mouth Twice a day Hold for loose stools 100 capsule 0    losartan (COZAAR) 25 mg tablet Take 1 tablet (25 mg total) by mouth daily 30 tablet 11    mycophenolate (CELLCEPT) 250 mg capsule 07/21/19: Take CellCept 1 pill (250 mg) in the AM and 2 pills (500 mg) in the PM,  "or as directed" 60 capsule 11    omeprazole (PRILOSEC) 20 mg capsule Take 20 mg by mouth in the morning and at bedtime      oxyCODONE (ROXICODONE) 5 mg tablet Take 1 tablet (5 mg total) by mouth every 6 (six) hours as needed (severe pain) (Patient  taking differently: Take 10 mg by mouth every 6 (six) hours as needed (severe pain)   ) 8 tablet 0    oxyCODONE-acetaminophen (PERCOCET) 10-325 mg tablet Take 1 tablet by mouth every 8 (eight) hours as needed for Pain (Lower back pain)         predniSONE (DELTASONE) 5 mg tablet Take 1 tablet (5 mg total) by mouth daily . 30 tablet 11    senna (SENOKOT) 8.6 mg tablet Take 2 tablets (17.2 mg total) by mouth nightly as needed for Constipation 100 tablet 0    sulfamethoxazole-trimethoprim (SEPTRA DS) 800-160 mg tablet Take 1 tablet by mouth daily through 06/19/2019, then 1 tablet every Monday, Wednesday, and Friday Stop after 05/19/2020 30 tablet 4    tacrolimus (PROGRAF) 1 mg capsule 10/05/19: Take 10 pills (10 mg total) in the AM and 10 pills (11 mg total) in the PM. Or as directed. (Patient taking differently: 10/05/19: Take 10 pills (11 mg total) in the AM and 10 pills (10 mg total) in the PM. Or as directed.  ) 630 capsule 11     No current facility-administered medications for this visit.       The patient is allergic to:   Allergies/Contraindications   Allergen Reactions    Aspirin Other (See Comments)     Bleeding ulcers; Has stomach bleeding         The patient's past family  history is notable for:  Family History   Problem Relation Name Age of Onset    Diabetes Mother      Heart disease Mother      Hypertension Mother      Diabetes Father      Heart disease Father      Hypertension Father          The patient's past social  history is notable for:  Social History     Socioeconomic History    Marital status: Single     Spouse name: Not on file    Number of children: 4    Years of education: 12    Highest education level: Associate degree: occupational, Scientist, product/process development, or vocational program   Occupational History  Occupation: Disabled Naval architect   Tobacco Use    Smoking status: Former Smoker     Packs/day: 1.00     Years: 4.00     Pack years: 4.00     Types: Cigarettes     Quit date: 06/19/2007      Years since quitting: 12.7    Smokeless tobacco: Never Used   Substance and Sexual Activity    Alcohol use: Not Currently    Drug use: Not Currently    Sexual activity: Not Currently   Other Topics Concern    Not on file   Social History Narrative    Not on file     Social Determinants of Health     Financial Resource Strain:     Difficulty of Paying Living Expenses:    Food Insecurity:     Worried About Programme researcher, broadcasting/film/video in the Last Year:     Barista in the Last Year:    Transportation Needs:     Freight forwarder (Medical):     Lack of Transportation (Non-Medical):    Physical Activity:     Days of Exercise per Week:     Minutes of Exercise per Session:    Stress:     Feeling of Stress :    Social Connections:     Frequency of Communication with Friends and Family:     Frequency of Social Gatherings with Friends and Family:     Attends Religious Services:     Active Member of Clubs or Organizations:     Attends Banker Meetings:     Marital Status:    Intimate Partner Violence:     Fear of Current or Ex-Partner:     Emotionally Abused:     Physically Abused:     Sexually Abused:         Review of systems was performed and reviewed today.        During the physical examination today, the patients vital signs were  There were no vitals taken for this visit.     General: Alert and oriented times three, pleasant, in no acute distress    Chest: Breathing is unlabored       The following lab results were personally reviewed by me:    Objective:      Lab Review  Lab Results   Component Value Date    WBC Count 3.4 (L) 03/16/2020    Hemoglobin 18.2 (H) 03/16/2020    Hematocrit 55.8 (H) 03/16/2020    MCV 78.2 (L) 03/16/2020    Platelet Count 334 03/16/2020      Lab Results   Component Value Date    Calcium, total, Serum / Plasma 10.2 03/16/2020      Lab Results   Component Value Date    Sodium, Serum / Plasma 141 03/16/2020    Potassium, Serum / Plasma 3.3 (L) 03/16/2020     Chloride, Serum / Plasma 106 03/16/2020    Carbon Dioxide, Total 28 03/16/2020      Lab Results   Component Value Date    Creatinine 1.51 (H) 12/24/2019    Creatinine, Serum / Plasma 1.67 (H) 03/16/2020      No results found for: POCTLEUK, POCTNITRITE, POCTPROTEIN, POCTPHUR, POCTRBCUR, POCTSPECGRAV, POCTKETONESU, POCTBILIUR, POCTGLUCUR    Results for orders placed or performed during the hospital encounter of 05/20/19   Urine Culture    Specimen: Urine, Midstream    URINE FROM BLADDER  Result Value Ref Range    Comments       Collected during surgical procedure.  CALL: [id number]      Bacterial Culture, Urine w/o gram stain No growth 2 days.        No results found for this or any previous visit.  Hemoglobin A1c (% of total Hgb)   Date Value   11/10/2019 5.2       No results found for: PSA, PSAS, PSAEXT, PSATOT, PSAU        Imaging: The patient had a CT performed on 01/06/20. These images were reviewed and interpreted by me personally today. This demonstrates non-definable lesions on native kidneys (known cysts seen on prior ultrasound)      Assessment and Plan: It was a pleasure seeing March Steyer in clinic today.     I addressed hematuria with the patient today.    We discussed the potential etiologies of hematuria, including but not limited to infection, lithiasis, trauma, and neoplasm.  I advised the patient that the standard hematuria work up consists of both upper tract imaging (RBUS, CT urogram, or similar) and lower urinary tract visualization with cystoscopy.  Cytology is useful in some cases.  Although an etiology for bleeding is not identified in all cases this evaluation is typically sufficient to detect any form of serious pathology.    Based on their symptoms of < 17 pack year smoking history and RBC between 10-25 they are considered the intermediate risk category indicating that a RBUS and cystoscopy are indicated.    Given that recent UA was around the time of biopsy I will plan to repeat along with  reflex urine culture (ucx in July was negative).     We will also plan to repeat RBUS given that CT was several months ago and non specific and we would like to avoid a contrast load in setting of transplant.    Will schedule in office cystoscopy. I described flexible cystoscopy and advised of the small but finite risks including infection, dysuria, and discomfort.  After discussing the risks and indications for cystoscopy the patient agrees to proceed with cystoscopy.    The patient was instructed to call our clinic nursing line at [phone] or the main line at [phone] with any questions or go to nearest emergency room should there be any fevers, chills, nausea, vomiting, worsening pain, chest pain, or inability to void.    Thank you for allowing [place] to participate in the care of this patient. Please do not hesitate to contact us should you have any questions or concerns.    Allena Earing, MD   Assistant Professor of Urology  Department of Urology  [facility], [place]      I performed this evaluation using real-time telehealth tools, including a live video Zoom connection between my location and the patient's location. Prior to initiating, the patient consented to perform this evaluation using telehealth tools. My location is in a [place] clinical facility.

## 2020-03-23 NOTE — Progress Notes (Signed)
Patient has consent to participate in the OKRA study.   AlloSure was ordered on 03/17/20 as per protocol for routine monitoring. The Allosure result was 0.22%. Based on the AlloSure result and other clinical information, the patient was informed of the following management plan: routine clinic follow up and labs per guideline

## 2020-03-24 NOTE — Telephone Encounter (Signed)
Scheduled procedure appointment with patient.     I am waiting to hear from Radiology to see if it'd be possible for patient to have the ultrasound done on the same afternoon as his procedure since he is coming from [facility].    I spoke with Danella Deis at [facility] and patient has been notified that I will let him know about the ultrasound once I hear back from Radiology.

## 2020-03-31 NOTE — E-Consult Note (Signed)
This came as a referral to hematology clinic. After review, we feel that the patient and referring provider are best served by converting to an eConsult.    Referring Provider;  Sandi Carne, MD    Referral Question:    I am referring this 44 y.o. man to Hematology.     My clinical question: post transplant erythrocytosis  eConsult considerations: OK to convert to eConsult (if reviewed by the specialist and determined to be appropriate).    SPECIALIST'S E-CONSULT RESPONSE      Dear Dr. Michaelle Birks:    Thank you for the referral/eConsult on Mr Franca regadring post-kidney txp erythrocytosis. I have read your referral note and reviewed aspects of his apex chart. My thoughts are:    1.  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    2.  Rx list reviewed:      3.  CBC data      kidney txp 05/19/2020    ASSESSMENT/REC:  Posttransplant erythrocytosis (PTE) is defined as persistently elevated hemoglobin and hematocrit levels that occur following kidney transplantation and persist for more than six months in the absence of thrombocytosis, leukocytosis, or another potential cause of erythrocytosis.  His erythrocytosis is post kidney txp but it has not yet persisted for more than 6 months.  While the exact cut off for Hgb and Hct is variable, I would say he has had this problem since early April 2021.   Typically it develops a little later after txp, this still seems like the right diagnosis.    The primary goals of treatment are to control symptoms (headache, and reduce the risk of thromboembolic events. The optimal hemoglobin among patients with PTE is not known. We generally target a hemoglobin level <17 g/dL (hematocrit <32 percent) in both men and women    Recommend  1. You can start him on an ACE inhibitor if you feel there is no contraindication. Those sometimes provide benefit (reduce Hct/Hct). If  not benefit in 4-8 weeks, then can stop.    2. I like to send an EPO level and do US of the transplanted and old kidney to be sure there is not an EPO-secreting tumor. Also have the [place] evaluation the liver (so abdominal US limited to those organs).    3. Depending on your preference with #1, we could instead go directly to phlebotomy.  Goal would be to keep his Hgb under 17 and/or Hct under 51.  I am happy to arrange that if you agree.  4. Please also send ferritin and iron panel. I am pretty sure he is iron deficient.  OF course that will get worse with phlebotomy but it is unavoidable.     Please send me a secure  email after reading this to confirm that you will send the tests in #2 and #4 and to let me know if we should proceed with #3 or if you are happy to try #1 first.  It is also fine to do both #1 and #3 at once.     Thank you and I look forward to working with you on the care of this patient.  HE is blind and lives in [place], so will be ideal if we can find a local center can proceed with #3.        Allyson Sabal, MD    I spent 21-30 minutes reviewing  and completing thiseConsult.      This eConsult is based on the clinical data available to me and is furnished without benefit of a comprehensive evaluation or physical examination.  The above will need to be interpreted in light of any clinical issues, or changes in patient status, not available to me at the time of filing this eConsult.  Please alert me if you have further questions.

## 2020-04-01 NOTE — Telephone Encounter (Addendum)
-------------------------  ADDENDUM---------------------------  04/07/20 Hgb 16.7. Per pt, he will Dr. Loma Newton set him up with a hematologist and he is waiting for a call to schedule phlebotomy session. Encouraged pt to follow up as Hgb remains high.     ----------------------------------------------------------------------  Per Dr. Michaelle Birks send EPO levels, iron , ferritin. He should go for phlebotomy given hgb 18.2 g/dl.     RN called pt's PCP for assistance in helping setting up the phlebotomy. Spoke with his PCP, Lonzo Candy, MD, who stated he will set up an appt with a hematologist to have the phlebotomy completed hematologist at [facility].    03/31/20 [facility] hematology consult response and most recent labs faxed to Dr. Loma Newton at confirmed fax number.     EPO levels, iron , ferritin ordered [facility] req number [id number] provided to patient.     Called pt w/ plan-of-care. Per pt, he is legally blind and his son reads his [facility] msgs. RN sent [facility] msg w/ plan-of-care and asked pt to have his son read it ASAP.     Per pt, he will have labs drawn on 04/04/20.

## 2020-04-09 LAB — BASIC METABOLIC PANEL (NA, K,
BUN/Creatinine Ratio: 12 (calc) (ref 6–22)
Calcium, total, Serum / Plasma: 9.9 mg/dL (ref 8.6–10.3)
Carbon Dioxide, Total: 29 mmol/L (ref 20–32)
Chloride, Serum / Plasma: 101 mmol/L (ref 98–110)
Creatinine, Serum / Plasma: 1.62 mg/dL — ABNORMAL HIGH (ref 0.60–1.35)
Glucose: 84 mg/dL (ref 65–99)
Potassium, Serum / Plasma: 3.5 mmol/L (ref 3.5–5.3)
Sodium, Serum / Plasma: 140 mmol/L (ref 135–146)
Urea Nitrogen, Serum / Plasma: 19 mg/dL (ref 7–25)
eGFR - high estimate: 59 mL/min/{1.73_m2} — ABNORMAL LOW (ref >=60)
eGFR - low estimate: 51 mL/min/{1.73_m2} — ABNORMAL LOW (ref >=60)

## 2020-04-09 LAB — COMPLETE BLOOD COUNT WITH DIFF
% Basophils: 0.5 %
% Eosinophils: 1.3 %
% Monocytes: 16 %
% Neutrophils: 69.4 %
Abs Basophils: 19 cells/uL (ref 0–200)
Abs Eosinophils: 48 cells/uL (ref 15–500)
Abs Lymphocytes: 474 cells/uL — ABNORMAL LOW (ref 850–3900)
Abs Monocytes: 592 cells/uL (ref 200–950)
Abs NRBC: 0 cells/uL
Abs Neutrophils: 2568 cells/uL (ref 1500–7800)
Hematocrit: 51.8 % — ABNORMAL HIGH (ref 38.5–50.0)
Hemoglobin: 16.7 g/dL (ref 13.2–17.1)
Lymphs: 12.8 %
MCH: 26.1 pg — ABNORMAL LOW (ref 27.0–33.0)
MCHC: 32.2 g/dL (ref 32.0–36.0)
MCV: 81.1 fL (ref 80.0–100.0)
MPV: 11 fL (ref 7.5–12.5)
Platelet Count: 266 10*3/uL (ref 140–400)
RBC Count: 6.39 10*6/uL — ABNORMAL HIGH (ref 4.20–5.80)
RDW: 14.5 % (ref 11.0–15.0)
WBC Count: 3.7 10*3/uL — ABNORMAL LOW (ref 3.8–10.8)

## 2020-04-09 LAB — CYTOMEGALOVIRUS DNA, QUANTITAT
CMV DNA, Quant. PCR: NOT DETECTED IU/mL
CMV DNA, Quantitative PCR (log: NOT DETECTED Log IU/mL

## 2020-04-09 LAB — PHOSPHORUS, SERUM / PLASMA: Phosphorus, Serum / Plasma: 3.8 mg/dL (ref 2.5–4.5)

## 2020-04-09 LAB — PROTEIN, TOTAL, RANDOM URINE
Creatinine, Random Urine: 199 mg/dL (ref 20–320)
Prot Concentration,UR: 62 mg/dL — ABNORMAL HIGH (ref 5–25)
Protein/Creat Ratio, Urine: 312 mg/g creat — ABNORMAL HIGH (ref 22–128)
Protein/Creatinine Ratio, Rand: 0.312 mg/mg creat — ABNORMAL HIGH (ref 0.022–0.128)

## 2020-04-09 LAB — TACROLIMUS, HIGHLY SENSITIVE,: Tacrolimus: 7.7 mcg/L

## 2020-04-09 LAB — MAGNESIUM, SERUM / PLASMA: Magnesium, Serum / Plasma: 1.7 mg/dL (ref 1.5–2.5)

## 2020-04-18 ENCOUNTER — Ambulatory Visit: Admit: 2020-04-18 | Discharge: 2020-05-25 | Payer: MEDICARE | Attending: Nephrology | Primary: Physician

## 2020-04-18 DIAGNOSIS — H548 Legal blindness, as defined in USA: Secondary | ICD-10-CM

## 2020-04-18 DIAGNOSIS — D751 Secondary polycythemia: Secondary | ICD-10-CM

## 2020-04-18 DIAGNOSIS — Z79899 Other long term (current) drug therapy: Secondary | ICD-10-CM

## 2020-04-18 DIAGNOSIS — Z940 Kidney transplant status: Secondary | ICD-10-CM

## 2020-04-18 DIAGNOSIS — R3129 Other microscopic hematuria: Secondary | ICD-10-CM

## 2020-04-18 NOTE — Progress Notes (Signed)
POST-KIDNEY TRANSPLANT NEPHROLOGY PROGRESS NOTE         Subjective      Chief Complaint   Patient presents with    Kidney Transplant Follow-up       History of Present Illness:  [name] is a 44 y.o. year old male S/P kidney transplant in 12/2/2020being seen for a follow-up visit.   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 Acute tubular epithelia injury; see comment.   2. No evidence of rejection, negative C4d stain.   2. No significant tubulointerstitial chronicity.   3. Moderate arteriosclerosis, donor-derived.     Special Stain Summary: (x8) PAS and trichrome special stains were  performed and examined to confirm the diagnosis. The findings are  consistent with our H&E-based light microscopic impression. Six  additional PAS-stained level sections were evaluated, and no segmentally  sclerotic glomeruli are identified.     DSAs not done      Immunosuppressive management encounter following kidney transplant 05/21/2019     Induction agent/dose: Thymo lite  Regime: Tac/MMF  Corticosteroid plan: maintenance   cPRA: 10%      Transplant follow-up 05/21/2019      Category Problem Type of follow-up needed in clinic? Ordered? Scheduled? Comments   Nursing (e.g., drain/ Foley) HD tunneled line refer to IR for removal when appropriate Needs to be done in clinic weekly dressing changes in HD clinic until can be removed   Lab/ Micro Standard Tac Follow up results   Needs to be ordered None   Imaging studies/ procedures NA NA NA None   Referrals Uroglogy for stent removal NA Already ordered None   Delayed graft function and improving UOP, monitor for resolution        Ureteral stent retained of kidney transplant (CMS code) 05/21/2019     Implant Name Type Inv. Item Serial No. Manufacturer Lot No. LRB No. Used Action   STENT URETHRAL 6FR X 16CM DOUBLE J   [id number] - [id number] Stents-Non Des STENT URETHRAL 6FR X 16CM DOUBLE J   [id number]   [id number] Right 1 Implanted     Referral placed for outpatient stent removal    07/01/19 stent removed.       At risk for opportunistic infections 05/21/2019     Transplant Serology Workup:  Recipient        Lab Results   Component Value Date    HBCAB NEG 12/17/2018    HBSAG NEG 12/17/2018    HBABQ >800 12/17/2018    HCV NEG 12/17/2018    RPR Nonreactive 12/17/2018    CMVAB POS (A) 12/17/2018  VCAM NEG 12/17/2018    EBNA POS (A) 12/17/2018    TOXO NEG 12/17/2018         Donor        Cadaver Donor UNOS ID (no units)   Date Value   05/20/2019 [id number]     Match ID: [id number]          Induction Plan:   Thymo + steroid maintenance     Plan for PPX:   Toxo prophylaxis: (D +/R - Toxo MISMATCH), Septra DS 1 tab PO Daily x 1 month (06/19/2019), then qMWF x 11 months more (05/19/2020) -- 1 year total for Toxo Mismatch   PCP prophylaxis: Septra DS as per above for Toxo ppx   CMV prophylaxis: (D +/R +), Valcyte 900 mg PO Daily (adjusted for renal function) x 3 months (08/18/2019)   Fungal prophylaxis: Fluconazole 100 mg PO q7days x 1 month (06/18/2019)  HBV prophylaxis: Not necessary            At risk of infection transmitted from donor 05/21/2019      Patient underwent transplant with no known donor derived risk factors: Donor is not CDC high risk, not HBV core ab + and there are no known donor derived infections. No need for treatment or monitoring beyond the usual opportunistic infection prophylaxis per protocol. Donor with no known malignancies.  The donor's CMV status is IgG positive and the recipient's CMV status is IgG positive.  Prophylaxis will be determined by risk of CMV transmission.                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     Dialysis Type: HD Hemodialysis  Dialysis Center: [facility]         Phone: [phone]        Fax: [phone]       Address: 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  Been on HD since 2010.  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        No outpatient medications have been marked as taking for the 04/18/20 encounter (Scheduled Telephone Encounter) with Sandi Carne, MD.             Objective        Phone visit     Review of Prior Testing  I personally reviewed data as noted below including the following:    1. Metabolic panel  2. Complete blood count  3. Urine chemistry including level of proteinuria  4. Level of immunosuppressive drug (tacrolimus).          Lab Results   Component Value Date    Creatinine 1.51 (H) 12/24/2019    Creatinine 2.12 (H) 06/15/2019    Creatinine 2.52 (H) 06/09/2019    Creatinine, Serum /  Plasma  1.62 (H) 04/06/2020    Creatinine, Serum / Plasma 1.67 (H) 03/16/2020    Creatinine, Serum / Plasma 1.61 (H) 03/16/2020    Urea Nitrogen, Serum / Plasma 19 04/06/2020    Urea Nitrogen, Serum / Plasma 22 03/16/2020    Urea Nitrogen, Serum / Plasma 23 03/16/2020    Sodium, Serum / Plasma 140 04/06/2020    Sodium, Serum / Plasma 141 03/16/2020    Sodium, Serum / Plasma 142 03/16/2020    Potassium, Serum / Plasma 3.5 04/06/2020    Potassium, Serum / Plasma 3.3 (L) 03/16/2020    Potassium, Serum / Plasma 3.2 (L) 03/16/2020    Carbon Dioxide, Total 29 04/06/2020    Carbon Dioxide, Total 28 03/16/2020    Carbon Dioxide, Total 24 03/16/2020    Calcium, total, Serum / Plasma 9.9 04/06/2020    Calcium, total, Serum / Plasma 10.2 03/16/2020    Calcium, total, Serum / Plasma 10.0 03/16/2020    Phosphorus, Serum / Plasma 3.8 04/06/2020    Phosphorus, Serum / Plasma 2.9 03/16/2020    Phosphorus, Serum / Plasma 4.2 02/24/2020     Lab Results   Component Value Date    WBC Count 3.7 (L) 04/06/2020    WBC Count 3.4 (L) 03/16/2020    WBC Count 3.5 (L) 02/24/2020    Hemoglobin 16.7 04/06/2020    Hemoglobin 18.2 (H) 03/16/2020    Hemoglobin 17.3 (H) 02/24/2020    Hematocrit 51.8 (H) 04/06/2020    Hematocrit 55.8 (H) 03/16/2020    Hematocrit 54.0 (H) 02/24/2020    MCV 81.1 04/06/2020    MCV 78.2 (L) 03/16/2020    MCV 80.0 02/24/2020    Platelet Count 266 04/06/2020    Platelet Count 334 03/16/2020    Platelet Count 284 02/24/2020       Lab Results   Component Value Date    Tacrolimus 7.7 04/06/2020    Tacrolimus 9.2 03/16/2020    Tacrolimus 8.9 03/16/2020    Tacrolimus 5.9 06/15/2019    Tacrolimus 5.2 06/09/2019    Tacrolimus 5.0 06/04/2019          Assessment and Plan       Deceased-donor kidney transplant recipient  S/p DDRT 05/20/2019; 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    Immunosuppressive management encounter  following kidney transplant  cPRA 10%.  Tacrolimus11mg in am and 10 mg in pmwith goal trough7-9ng/ml.  MMF  250 mg in am and 500 mg in pm ( on lower dose due to prolonged leucopenia, increase as tolerated)  Continue prednisone5 mg daily.  Results of allograft biopsy on 12/24/2019  showed no rejection, DSA neg    At risk for opportunistic infections  Continue Septra MWF until 05/19/2020    HTN (hypertension)  BP controlled     Microhematuria:   CT imaging showing no renal lesions.  He is being evaluated by  Urology and planned for cystoscopy which had to be rescheduled as patient was not able to come to [place].      Post transplant erythrocytosis:  Continue losartan 25 mg daily.  Repeat Hgb improved.  He has scheduled appointment to see his local hematologist.        Visit Diagnoses and Associated Orders Placed:  1. Deceased-donor kidney transplant recipient     2. Immunosuppressive management encounter following kidney transplant     3. Legally blind     4.  Post-transplant erythrocytosis     5. Microhematuria        No orders of the defined types were placed in this encounter.          Health Care Maintenance:   Recommend usual age- and sex-appropriate cancer screening plus annual dermatology visit for skin cancer surveillance   Recommend age and disease-appropriate vaccinations (killed vaccines only) including annual influenza vaccine. Live vaccines are contraindicated.     Risk Assessment: The patient is at increased risk of complications including drug toxicity, infection and malignancy as a result of immunosuppression for his kidney transplant and requires close monitoring of immunosuppression levels and dose adjustment to manage toxicities.   There is no evidence of immunosuppressive drug toxicity at this visit.                        The patient requested this visit. The visit is in place of a face to face visit. The patient does not have access to or the ability to use the tools necessary to  participate in a video visit and therefore consented to conduct this visit by telephone only. I spent a total of 23 minutes in audio communication with this patient.

## 2020-04-20 NOTE — Telephone Encounter (Signed)
Spoke with patient and informed him that I'll be in touch to reschedule once I have a new procedure date for Dr. Charm Barges.

## 2020-04-20 NOTE — Telephone Encounter (Signed)
Copied from CRM #[id number]. Topic: [facility] - Appointment Request   >> Apr 18, 2020 10:57 AM Carlean Jews wrote:  APPOINTMENT TEMPLATE    PATIENT NAME: Cody Myers  DATE OF BIRTH: 10/26/1975  MRN: [id number]    HOME PHONE NUMBER:    [phone]    ALTERNATE PHONE NUMBER:     na  LEAVING A MESSAGE OK?:    yes     REASON FOR APPOINTMENT:     F/U URO PROCEDURE     INSURANCE INFORMATION:  PAYOR: Medicare   PLAN: [employer]    MESSAGE / PROBLEM:     pt called in stating needs to cancel appt on 11/1 due to not having a ride please assist  agent advise turn around time       MESSAGE GENERATED BY [facility]  CRM NUMBER [id number] CREATED BY:    Carlean Jews,     04/18/2020,      10:57 AM

## 2020-04-22 NOTE — Progress Notes (Addendum)
62M, ESRD d/t FSGS/HTN, s/p DDRT 05/20/2019. cPRA 10% KDPI 38%    -------------------------ADDENDUM---------------------------  Pt has not called back for complete plan-of-care from 04/22/20.     Called pt, who stated that the blood bank would not provide phlebotomy because he advised them that he had a "mass" on his kidney. Per 03/14/20 [facility] note, CT imaging shows no renal lesions.     Provided plan-of-care to pt per below. Confirms he is taking losartan 25 mg/day. He wishes to discuss increase in losartan with his PCP d/t concerns about increasing the dose of losartan medication which can lower his BP and he is already on BP meds. Per pt, BPs sys 130s to 150.      Pt stated he would call Dr. Seth Bake for an appt.     RN note right faxed to Dr. Seth Bake.    ----------------------------------------------------------------------      IS Medications: Tacrolimus  mg/10 mg, goal 7-9, MMF 250 mg BID (d/t low WBC), Pred 5 mg BID    Creatinine: 1.78, 04/21/20. Prev 1.62, 1.67    Next [facility] appt: 04/18/2020 Sandi Carne, MD    Issues:  04/21/20 H/H 17.4/55.2 Creatinine: 1.78, 04/21/20. Prev 1.62, 1.67  C/o slight HA and a little dizzy.     RN action:  Called Dr. Lequita Asal.    Dr. Lorelee Market Plan-of-Care:    Things to worry about is a stroke and/or blood clot. ARB lowers HCT but takes a few weeks and can increase K.      Confirm with pt that he is taking Losartan. If on Losartan and BP is in the 130/140s, incr Losartan to 50 mg/day. Monitor BP daily. Dehydration can aggravate sludging of RBC.  If symptoms incr, go to local ER. [place] of kidney if he hasn't had one in 5-years.     RN action:   Called patient with plan-of-care. Decided to visit at local ER. Does not have the information re: BP numbers on hand. Will call RN with update post ER-visit.

## 2020-04-24 LAB — COMPLETE BLOOD COUNT WITH DIFF
% Basophils: 0.9 %
% Eosinophils: 1.2 %
% Monocytes: 13.9 %
% Neutrophils: 66.8 %
Abs Basophils: 31 cells/uL (ref 0–200)
Abs Eosinophils: 41 cells/uL (ref 15–500)
Abs Lymphocytes: 585 cells/uL — ABNORMAL LOW (ref 850–3900)
Abs Monocytes: 473 cells/uL (ref 200–950)
Abs NRBC: 0 cells/uL
Abs Neutrophils: 2271 cells/uL (ref 1500–7800)
Hematocrit: 55.2 % — ABNORMAL HIGH (ref 38.5–50.0)
Hemoglobin: 17.4 g/dL — ABNORMAL HIGH (ref 13.2–17.1)
Lymphs: 17.2 %
MCH: 25.3 pg — ABNORMAL LOW (ref 27.0–33.0)
MCHC: 31.5 g/dL — ABNORMAL LOW (ref 32.0–36.0)
MCV: 80.3 fL (ref 80.0–100.0)
MPV: 10.4 fL (ref 7.5–12.5)
Platelet Count: 315 10*3/uL (ref 140–400)
RBC Count: 6.87 10*6/uL — ABNORMAL HIGH (ref 4.20–5.80)
RDW: 16.2 % — ABNORMAL HIGH (ref 11.0–15.0)
WBC Count: 3.4 10*3/uL — ABNORMAL LOW (ref 3.8–10.8)

## 2020-04-24 LAB — PROTEIN, TOTAL, RANDOM URINE
Creatinine, Random Urine: 270 mg/dL (ref 20–320)
Prot Concentration,UR: 57 mg/dL — ABNORMAL HIGH (ref 5–25)
Protein/Creat Ratio, Urine: 211 mg/g creat — ABNORMAL HIGH (ref 22–128)
Protein/Creatinine Ratio, Rand: 0.211 mg/mg creat — ABNORMAL HIGH (ref 0.022–0.128)

## 2020-04-24 LAB — "TACROLIMUS, HIGHLY SENSITIVE, ": Tacrolimus: 10.4 mcg/L

## 2020-04-24 LAB — PHOSPHORUS, SERUM / PLASMA: Phosphorus, Serum / Plasma: 3.3 mg/dL (ref 2.5–4.5)

## 2020-04-24 LAB — CYTOMEGALOVIRUS DNA, QUANTITAT
CMV DNA, Quant. PCR: NOT DETECTED IU/mL
CMV DNA, Quantitative PCR (log: NOT DETECTED Log IU/mL

## 2020-04-24 LAB — "BASIC METABOLIC PANEL (NA, K, "
BUN/Creatinine Ratio: 12 (calc) (ref 6–22)
Calcium, total, Serum / Plasma: 10.2 mg/dL (ref 8.6–10.3)
Carbon Dioxide, Total: 27 mmol/L (ref 20–32)
Chloride, Serum / Plasma: 103 mmol/L (ref 98–110)
Creatinine, Serum / Plasma: 1.78 mg/dL — ABNORMAL HIGH (ref 0.60–1.35)
Glucose: 96 mg/dL (ref 65–139)
Potassium, Serum / Plasma: 3.8 mmol/L (ref 3.5–5.3)
Sodium, Serum / Plasma: 141 mmol/L (ref 135–146)
eGFR - high estimate: 53 mL/min/{1.73_m2} — ABNORMAL LOW

## 2020-04-24 LAB — BASIC METABOLIC PANEL (NA, K,
Urea Nitrogen, Serum / Plasma: 21 mg/dL (ref 7–25)
eGFR - low estimate: 45 mL/min/{1.73_m2} — ABNORMAL LOW (ref >=60)

## 2020-04-24 LAB — MAGNESIUM, SERUM / PLASMA: Magnesium, Serum / Plasma: 1.8 mg/dL (ref 1.5–2.5)

## 2020-05-03 NOTE — Progress Notes (Signed)
Msg from pt stating he was unable to attend an appt w/[facility] d/t "very sharp stomach pain. And I don't know if I should go to the hospital."    Called pt who reports sharp pains (10/10) from "top of stomach to navel. Had a little bit of slow breathing/sob." Denies chest pain/discomfort, n/v/f/c. During conversation, pt reported all symptoms had abated. Advised If you feel your symptoms become severe or require immediate consultation please proceed to your nearest emergency room.  If you would like to discuss your symptoms same day please call the clinic.     Reports PCP increased Losartan to 50 mg/day to help w/erythrocytosis. Will have labs completed 05/04/20. Reminded pt to have labs completed 12-hours after night time dose.

## 2020-05-06 MED ORDER — POTASSIUM CHLORIDE ER 20 MEQ TABLET,EXTENDED RELEASE(PART/CRYST)
20 | ORAL_TABLET | Freq: Every day | ORAL | 0 refills | 63.00000 days | Status: DC
Start: 2020-05-06 — End: 2020-08-11

## 2020-05-06 NOTE — Telephone Encounter (Signed)
Ok to start on Kcl 20 meq daily for 1 week.   High potassium diet

## 2020-05-06 NOTE — Progress Notes (Signed)
80M, ESRD d/t FSGS/HTN, s/p DDRT 05/20/2019. cPRA 10% KDPI 38%    Next [facility] appt: 05/19/2020 Sandi Carne, MD    Creatinine: 1.48    IS Medications: Tacrolimus  11/10, goal 7-9, MMF 250 mg BID (d/t low WBC), Pred 5 mg BID    Issues:  K 3.2, prev 3.8, 3.5, 3.3    RN action:  Note to Dr. Michaelle Birks .    Dr. Daryll Drown Plan-of-Care:      Ok to start on Kcl 20 meq daily for 1 week.   High potassium diet    RN action:  MC msg to pt w/ plan-of-care and dietary K info sheet    Medication pended for Dr. Daryll Drown signature

## 2020-05-07 LAB — COMPLETE BLOOD COUNT WITH DIFF
% Basophils: 0.8 %
% Eosinophils: 1 %
% Monocytes: 14 %
% Neutrophils: 70 %
Abs Basophils: 31 cells/uL (ref 0–200)
Abs Eosinophils: 39 cells/uL (ref 15–500)
Abs Lymphocytes: 554 cells/uL — ABNORMAL LOW (ref 850–3900)
Abs Monocytes: 546 cells/uL (ref 200–950)
Abs Neutrophils: 2730 cells/uL (ref 1500–7800)
Hematocrit: 52.2 % — ABNORMAL HIGH (ref 38.5–50.0)
Hemoglobin: 16.7 g/dL (ref 13.2–17.1)
Lymphs: 14.2 %
MCH: 25.1 pg — ABNORMAL LOW (ref 27.0–33.0)
MCHC: 32 g/dL (ref 32.0–36.0)
MCV: 78.5 fL — ABNORMAL LOW (ref 80.0–100.0)
MPV: 10.8 fL (ref 7.5–12.5)
Platelet Count: 291 10*3/uL (ref 140–400)
RBC Count: 6.65 10*6/uL — ABNORMAL HIGH (ref 4.20–5.80)
RDW: 15.4 % — ABNORMAL HIGH (ref 11.0–15.0)
WBC Count: 3.9 10*3/uL (ref 3.8–10.8)

## 2020-05-07 LAB — BASIC METABOLIC PANEL (NA, K,
BUN/Creatinine Ratio: 12 (calc) (ref 6–22)
Calcium, total, Serum / Plasma: 9.8 mg/dL (ref 8.6–10.3)
Carbon Dioxide, Total: 23 mmol/L (ref 20–32)
Chloride, Serum / Plasma: 105 mmol/L (ref 98–110)
Creatinine, Serum / Plasma: 1.48 mg/dL — ABNORMAL HIGH (ref 0.60–1.35)
Glucose: 114 mg/dL — ABNORMAL HIGH (ref 65–99)
Potassium, Serum / Plasma: 3.2 mmol/L — ABNORMAL LOW (ref 3.5–5.3)
Sodium, Serum / Plasma: 142 mmol/L (ref 135–146)
Urea Nitrogen, Serum / Plasma: 18 mg/dL (ref 7–25)
eGFR - high estimate: 66 mL/min/{1.73_m2} (ref >=60)
eGFR - low estimate: 57 mL/min/{1.73_m2} — ABNORMAL LOW (ref >=60)

## 2020-05-07 LAB — CYTOMEGALOVIRUS DNA, QUANTITAT
CMV DNA, Quant. PCR: NOT DETECTED IU/mL
CMV DNA, Quantitative PCR (log: NOT DETECTED Log IU/mL

## 2020-05-07 LAB — PHOSPHORUS, SERUM / PLASMA: Phosphorus, Serum / Plasma: 3.6 mg/dL (ref 2.5–4.5)

## 2020-05-07 LAB — TACROLIMUS, HIGHLY SENSITIVE,: Tacrolimus: 7.9 mcg/L

## 2020-05-07 LAB — MAGNESIUM, SERUM / PLASMA: Magnesium, Serum / Plasma: 1.6 mg/dL (ref 1.5–2.5)

## 2020-05-10 LAB — PROTEIN, TOTAL, RANDOM URINE
Creatinine, Random Urine: 168 mg/dL (ref 20–320)
Prot Concentration,UR: 45 mg/dL — ABNORMAL HIGH (ref 5–25)
Protein/Creat Ratio, Urine: 268 mg/g creat — ABNORMAL HIGH (ref 22–128)
Protein/Creatinine Ratio, Rand: 0.268 mg/mg creat — ABNORMAL HIGH (ref 0.022–0.128)

## 2020-05-17 MED ORDER — AMLODIPINE 10 MG TABLET
10 | ORAL_TABLET | ORAL | 11 refills | Status: DC
Start: 2020-05-17 — End: 2020-07-18

## 2020-05-19 ENCOUNTER — Ambulatory Visit: Admit: 2020-05-19 | Discharge: 2020-05-25 | Payer: MEDICARE | Attending: Nephrology | Primary: Physician

## 2020-05-19 DIAGNOSIS — Z940 Kidney transplant status: Secondary | ICD-10-CM

## 2020-05-19 DIAGNOSIS — H548 Legal blindness, as defined in USA: Secondary | ICD-10-CM

## 2020-05-19 MED ORDER — LOSARTAN 25 MG TABLET
25 | ORAL_TABLET | Freq: Every day | ORAL | 11 refills | 90.00000 days | Status: AC
Start: 2020-05-19 — End: 2021-04-04

## 2020-05-19 NOTE — Progress Notes (Signed)
POST-KIDNEY TRANSPLANT NEPHROLOGY PROGRESS NOTE         Subjective      Chief Complaint   Patient presents with    Kidney Transplant Follow-up       History of Present Illness:  Cody Myers is a 44 y.o. year old male S/P kidney transplant in 05/20/2019, being seen for a follow-up visit.    Patient was seen in [facility] ER for abdominal pain and shortness of breath.CT abdomen showed no acute pathology.pertinent labs Hgb 19 g/dl, serum creatinine 1.4 mg/dl. UA no infection but microhematuria +  He underwent therapeutic phlebotomy during this visit.  Symptoms resolved.     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 Acute tubular epithelia injury; see comment.   2. No evidence of rejection, negative C4d stain.   2. No significant tubulointerstitial chronicity.   3. Moderate arteriosclerosis, donor-derived.     Special Stain Summary: (x8) PAS and trichrome special stains were  performed and examined to confirm the diagnosis. The findings are  consistent with our H&E-based light microscopic impression. Six  additional PAS-stained level sections were evaluated,  and no segmentally  sclerotic glomeruli are identified.     DSAs not done      Immunosuppressive management encounter following kidney transplant 05/21/2019     Induction agent/dose: Thymo lite  Regime: Tac/MMF  Corticosteroid plan: maintenance   cPRA: 10%      Transplant follow-up 05/21/2019     Category Problem Type of follow-up needed in clinic? Ordered? Scheduled? Comments   Nursing (e.g., drain/ Foley) HD tunneled line refer to IR for removal when appropriate Needs to be done in clinic weekly dressing changes in HD clinic until can be removed   Lab/ Micro Standard Tac Follow up results   Needs to be ordered None   Imaging studies/ procedures NA NA NA None   Referrals Uroglogy for stent removal NA Already ordered None   Delayed graft function and improving UOP, monitor for resolution        Ureteral stent retained of kidney transplant (CMS code) 05/21/2019     Implant Name Type Inv. Item Serial No. Manufacturer Lot No. LRB No. Used Action   STENT URETHRAL 6FR X 16CM DOUBLE J   [id number] - [id number] Stents-Non Des STENT URETHRAL 6FR X 16CM DOUBLE J   [id number]   [id number] Right 1 Implanted     Referral placed for outpatient stent removal    07/01/19 stent removed.       At risk for opportunistic infections 05/21/2019     Transplant Serology Workup:  Recipient  Lab Results   Component Value Date    HBCAB NEG 12/17/2018    HBSAG NEG 12/17/2018    HBABQ >800 12/17/2018    HCV NEG 12/17/2018    RPR Nonreactive 12/17/2018    CMVAB POS (A) 12/17/2018    VCAM NEG 12/17/2018    EBNA POS (A) 12/17/2018    TOXO NEG 12/17/2018         Donor        Cadaver Donor UNOS ID (no units)   Date Value   05/20/2019 [id number]     Match ID: [id number]          Induction Plan:   Thymo + steroid maintenance     Plan for PPX:   Toxo prophylaxis: (D +/R - Toxo MISMATCH), Septra DS 1 tab PO Daily x 1 month (06/19/2019), then qMWF x 11 months more (05/19/2020) -- 1 year total for Toxo Mismatch   PCP prophylaxis: Septra DS as per  above for Toxo ppx   CMV prophylaxis: (D +/R +), Valcyte 900 mg PO Daily (adjusted for renal function) x 3 months (08/18/2019)   Fungal prophylaxis: Fluconazole 100 mg PO q7days x 1 month (06/18/2019)  HBV prophylaxis: Not necessary            At risk of infection transmitted from donor 05/21/2019     Patient underwent transplant with no known donor derived risk factors: Donor is not CDC high risk, not HBV core ab + and there are no known donor derived infections. No need for treatment or monitoring beyond the usual opportunistic infection prophylaxis per protocol. Donor with no known malignancies.  The donor's CMV status is IgG positive and the recipient's CMV status is IgG positive.  Prophylaxis will be determined by risk of CMV transmission.                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     Dialysis Type: HD Hemodialysis  Dialysis Center: [facility]         Phone: [phone]        Fax: [phone]       Address: [phone] 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  Been on HD since 2010.  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 Metabolic panel  2. Complete blood count  3. Urine chemistry including level of proteinuria  4. Level of immunosuppressive drug (tacrolimus).      Lab Results   Component Value Date    Creatinine 1.51 (H)  12/24/2019    Creatinine 2.12 (H) 06/15/2019  Creatinine 2.52 (H) 06/09/2019    Creatinine, Serum / Plasma 1.48 (H) 05/04/2020    Creatinine, Serum / Plasma 1.78 (H) 04/21/2020    Creatinine, Serum / Plasma 1.62 (H) 04/06/2020    Urea Nitrogen, Serum / Plasma 18 05/04/2020    Urea Nitrogen, Serum / Plasma 21 04/21/2020    Urea Nitrogen, Serum / Plasma 19 04/06/2020    Sodium, Serum / Plasma 142 05/04/2020    Sodium, Serum / Plasma 141 04/21/2020    Sodium, Serum / Plasma 140 04/06/2020    Potassium, Serum / Plasma 3.2 (L) 05/04/2020    Potassium, Serum / Plasma 3.8 04/21/2020    Potassium, Serum / Plasma 3.5 04/06/2020    Carbon Dioxide, Total 23 05/04/2020    Carbon Dioxide, Total 27 04/21/2020    Carbon Dioxide, Total 29 04/06/2020    Calcium, total, Serum / Plasma 9.8 05/04/2020    Calcium, total, Serum / Plasma 10.2 04/21/2020    Calcium, total, Serum / Plasma 9.9 04/06/2020    Phosphorus, Serum / Plasma 3.6 05/04/2020    Phosphorus, Serum / Plasma 3.3 04/21/2020    Phosphorus, Serum / Plasma 3.8 04/06/2020     Lab Results   Component Value Date    WBC Count 3.9 05/04/2020    WBC Count 3.4 (L) 04/21/2020    WBC Count 3.7 (L) 04/06/2020    Hemoglobin 16.7 05/04/2020    Hemoglobin 17.4 (H) 04/21/2020    Hemoglobin 16.7 04/06/2020    Hematocrit 52.2 (H) 05/04/2020    Hematocrit 55.2 (H) 04/21/2020    Hematocrit 51.8 (H) 04/06/2020    MCV 78.5 (L) 05/04/2020    MCV 80.3 04/21/2020    MCV 81.1 04/06/2020    Platelet Count 291 05/04/2020    Platelet Count 315 04/21/2020    Platelet Count 266 04/06/2020       Lab Results   Component Value Date    Tacrolimus 7.9 05/04/2020    Tacrolimus 10.4 04/21/2020    Tacrolimus 7.7 04/06/2020    Tacrolimus 5.9 06/15/2019    Tacrolimus 5.2 06/09/2019    Tacrolimus 5.0 06/04/2019          Assessment and Plan       Deceased-donor kidney transplant recipient  S/p DDRT 05/20/2019; 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    Immunosuppressive management encounter following kidney transplant  cPRA 10%.  Tacrolimus11mg in am and 10 mg in pmwith goal trough7-9ng/ml.  MMF  250 mg in am and 500 mg in pm ( on lower dose due to prolonged leucopenia, increase as tolerated)  Continue prednisone5 mg daily.  Results of allograft biopsy on 12/24/2019 showed no rejection, DSA neg      HTN (hypertension)  BP controlled    Microhematuria:   CT imaging showing no renal lesions.  He is being evaluated by   Urology and planned for cystoscopy which had to be rescheduled as patient was not able to come to [place].      Post transplant erythrocytosis:  Losartan increased to 50 mg daily.  He has scheduled appointment to see his local hematologist.  He underwent therapeutic phlebotomy in ED yesterday.  CT showed no renal lesions.        Visit Diagnoses and Associated Orders Placed:  1. Deceased-donor kidney transplant recipient     2. Immunosuppressive management encounter following kidney transplant     3. Legally blind  4. Primary hypertension     5. Post-transplant erythrocytosis     6. Microhematuria        No orders of the defined types were placed in this encounter.        Health Care Maintenance:   Recommend usual age- and sex-appropriate cancer screening plus annual dermatology visit for skin cancer surveillance   Recommend age and disease-appropriate vaccinations (killed vaccines only) including annual influenza vaccine. Live vaccines are contraindicated.     Risk Assessment: The patient is at increased risk of complications including drug toxicity, infection and malignancy as a result of immunosuppression for his kidney transplant and requires close monitoring of immunosuppression levels and dose adjustment to manage toxicities.   There is no evidence of immunosuppressive drug toxicity at this visit.                        The patient requested this visit. The visit is in place of a  face to face visit. The patient does not have access to or the ability to use the tools necessary to participate in a video visit and therefore consented to conduct this visit by telephone only. I spent a total of 24 minutes in audio communication with this patient.

## 2020-05-23 NOTE — Progress Notes (Signed)
Patient has consent to participate in the OKRA study.   AlloSure was ordered on 05/19/20 as per protocol for routine monitoring. The Allosure result was 0.31%. Based on the AlloSure result and other clinical information, the patient was informed of the following management plan: routine clinic follow up and labs per guideline

## 2020-05-28 LAB — COMPLETE BLOOD COUNT WITH DIFFERENTIAL
% Basophils: 0.6 %
% Eosinophils: 0.6 %
% Monocytes: 19.8 %
% Neutrophils: 61 %
Abs Lymphocytes: 576 {cells}/uL — ABNORMAL LOW (ref 850–3900)
MCH: 25.6 pg — ABNORMAL LOW (ref 27.0–33.0)
MCHC: 32.9 g/dL (ref 32.0–36.0)
MCV: 77.9 fL — ABNORMAL LOW (ref 80.0–100.0)
RDW-CV: 15.2 % — ABNORMAL HIGH (ref 11.0–15.0)
WBC Count: 3.2 10*3/uL — ABNORMAL LOW (ref 3.8–10.8)

## 2020-05-28 LAB — BASIC METABOLIC PANEL (NA, K, CL, CO2, BUN, CR, GLU, CA)
BUN/Creatinine Ratio: 11 (calc) (ref 6–22)
Carbon Dioxide, Total: 25 mmol/L (ref 20–32)
Creatinine, Serum / Plasma: 1.51 mg/dL — ABNORMAL HIGH (ref 0.60–1.35)
Potassium, Serum / Plasma: 3.1 mmol/L — ABNORMAL LOW (ref 3.5–5.3)
eGFR - high estimate: 64 mL/min/{1.73_m2} (ref >=60)

## 2020-05-28 LAB — PROTEIN, TOTAL, RANDOM URINE
Creatinine, Random Urine: 213 mg/dL (ref 20–320)
Prot Concentration,UR: 89 mg/dL — ABNORMAL HIGH (ref 5–25)
Protein/Creat Ratio, Urine: 418 mg/g creat — ABNORMAL HIGH (ref 22–128)
Protein/Creatinine Ratio, Random Urine: 0.418 mg/mg{creat} — ABNORMAL HIGH (ref 0.022–0.128)

## 2020-05-28 LAB — COMPLETE BLOOD COUNT WITH DIFF
Abs Basophils: 19 cells/uL (ref 0–200)
Abs Eosinophils: 19 cells/uL (ref 15–500)
Abs Monocytes: 634 cells/uL (ref 200–950)
Abs Neutrophils: 1952 cells/uL (ref 1500–7800)
Hematocrit: 48 % (ref 38.5–50.0)
Hemoglobin: 15.8 g/dL (ref 13.2–17.1)
Lymphs: 18 %
MPV: 10.8 fL (ref 7.5–12.5)
Platelet Count: 233 10*3/uL (ref 140–400)
RBC Count: 6.16 10*6/uL — ABNORMAL HIGH (ref 4.20–5.80)

## 2020-05-28 LAB — BASIC METABOLIC PANEL (NA, K,
Calcium, total, Serum / Plasma: 9.4 mg/dL (ref 8.6–10.3)
Chloride, Serum / Plasma: 103 mmol/L (ref 98–110)
Glucose: 129 mg/dL (ref 65–139)
Sodium, Serum / Plasma: 138 mmol/L (ref 135–146)
Urea Nitrogen, Serum / Plasma: 17 mg/dL (ref 7–25)
eGFR - low estimate: 55 mL/min/{1.73_m2} — ABNORMAL LOW (ref >=60)

## 2020-05-28 LAB — CYTOMEGALOVIRUS DNA, QUANTITATIVE PCR, PLASMA: CMV DNA, Quant. PCR: NOT DETECTED [IU]/mL

## 2020-05-28 LAB — PHOSPHORUS, SERUM / PLASMA: Phosphorus, Serum / Plasma: 2.8 mg/dL (ref 2.5–4.5)

## 2020-05-28 LAB — CYTOMEGALOVIRUS DNA, QUANTITAT: CMV DNA, Quantitative PCR (log: NOT DETECTED Log IU/mL

## 2020-05-28 LAB — TACROLIMUS, HIGHLY SENSITIVE,: Tacrolimus: 8.2 mcg/L

## 2020-05-28 LAB — MAGNESIUM, SERUM / PLASMA: Magnesium, Serum / Plasma: 1.6 mg/dL (ref 1.5–2.5)

## 2020-05-31 MED ORDER — TACROLIMUS 1 MG CAPSULE, IMMEDIATE-RELEASE
1 | ORAL_CAPSULE | Freq: Every morning | ORAL | 11 refills | Status: DC
Start: 2020-05-31 — End: 2020-08-11

## 2020-06-18 LAB — COMPLETE BLOOD COUNT WITH DIFF
% Basophils: 0.5 %
% Monocytes: 15.1 %
% Neutrophils: 63.7 %
Abs Lymphocytes: 832 cells/uL — ABNORMAL LOW (ref 850–3900)
Hematocrit: 49.5 % (ref 38.5–50.0)
Lymphs: 19.8 %
MCH: 25.9 pg — ABNORMAL LOW (ref 27.0–33.0)
MCHC: 32.9 g/dL (ref 32.0–36.0)
RDW: 15.7 % — ABNORMAL HIGH (ref 11.0–15.0)

## 2020-06-18 LAB — COMPLETE BLOOD COUNT WITH DIFFERENTIAL
% Eosinophils: 0.9 %
Abs Basophils: 21 {cells}/uL (ref 0–200)
Abs Eosinophils: 38 {cells}/uL (ref 15–500)
Abs Monocytes: 634 {cells}/uL (ref 200–950)
Abs Neutrophils: 2675 {cells}/uL (ref 1500–7800)
Hemoglobin: 16.3 g/dL (ref 13.2–17.1)
MCV: 78.7 fL — ABNORMAL LOW (ref 80.0–100.0)
MPV: 11.1 fL (ref 7.5–12.5)
Platelet Count: 286 10*3/uL (ref 140–400)
RBC Count: 6.29 10*6/uL — ABNORMAL HIGH (ref 4.20–5.80)
WBC Count: 4.2 10*3/uL (ref 3.8–10.8)

## 2020-06-18 LAB — BASIC METABOLIC PANEL (NA, K, CL, CO2, BUN, CR, GLU, CA)
Chloride, Serum / Plasma: 104 mmol/L (ref 98–110)
Creatinine, Serum / Plasma: 2.04 mg/dL — ABNORMAL HIGH (ref 0.60–1.35)
Glucose: 91 mg/dL (ref 65–139)
Sodium, Serum / Plasma: 141 mmol/L (ref 135–146)
eGFR - low estimate: 38 mL/min/{1.73_m2} — ABNORMAL LOW (ref >=60)

## 2020-06-18 LAB — TACROLIMUS, HIGHLY SENSITIVE,: Tacrolimus: 11.2 mcg/L

## 2020-06-18 LAB — PROTEIN, TOTAL, RANDOM URINE
Creatinine, Random Urine: 270 mg/dL (ref 20–320)
Prot Concentration,UR: 64 mg/dL — ABNORMAL HIGH (ref 5–25)
Protein/Creat Ratio, Urine: 237 mg/g creat — ABNORMAL HIGH (ref 22–128)
Protein/Creatinine Ratio, Rand: 0.237 mg/mg creat — ABNORMAL HIGH (ref 0.022–0.128)

## 2020-06-18 LAB — BASIC METABOLIC PANEL (NA, K,
BUN/Creatinine Ratio: 14 (calc) (ref 6–22)
Calcium, total, Serum / Plasma: 10 mg/dL (ref 8.6–10.3)
Carbon Dioxide, Total: 23 mmol/L (ref 20–32)
Potassium, Serum / Plasma: 3.4 mmol/L — ABNORMAL LOW (ref 3.5–5.3)
Urea Nitrogen, Serum / Plasma: 28 mg/dL — ABNORMAL HIGH (ref 7–25)
eGFR - high estimate: 45 mL/min/{1.73_m2} — ABNORMAL LOW (ref >=60)

## 2020-06-18 LAB — MAGNESIUM, SERUM / PLASMA: Magnesium, Serum / Plasma: 1.5 mg/dL (ref 1.5–2.5)

## 2020-06-18 LAB — CYTOMEGALOVIRUS DNA, QUANTITAT
CMV DNA, Quant. PCR: NOT DETECTED IU/mL
CMV DNA, Quantitative PCR (log: NOT DETECTED Log IU/mL

## 2020-06-18 LAB — PHOSPHORUS, SERUM / PLASMA: Phosphorus, Serum / Plasma: 4 mg/dL (ref 2.5–4.5)

## 2020-06-20 NOTE — Progress Notes (Signed)
Cr 2.04, tac 11.2. Call pt. No ans. Called Son Cody Myers who stated not a 12-hour level and will have pt repeat tac level on 06/21/19.    Per son, pt has been "sneaking snacks" and "not listening to me a lot" but he is "drinking his water."    Advised continued vigilance in caring for kidney health. Per son, he will let pt know nurse called to check up on him.

## 2020-07-18 LAB — BASIC METABOLIC PANEL (NA, K,
BUN/Creatinine Ratio: 11 (calc) (ref 6–22)
Calcium, total, Serum / Plasma: 9.8 mg/dL (ref 8.6–10.3)
Carbon Dioxide, Total: 26 mmol/L (ref 20–32)
Chloride, Serum / Plasma: 104 mmol/L (ref 98–110)
Creatinine, Serum / Plasma: 1.38 mg/dL — ABNORMAL HIGH (ref 0.60–1.35)
Glucose: 95 mg/dL (ref 65–99)
Potassium, Serum / Plasma: 3.4 mmol/L — ABNORMAL LOW (ref 3.5–5.3)
Sodium, Serum / Plasma: 142 mmol/L (ref 135–146)
Urea Nitrogen, Serum / Plasma: 15 mg/dL (ref 7–25)
eGFR - high estimate: 72 mL/min/{1.73_m2} (ref >=60)
eGFR - low estimate: 62 mL/min/{1.73_m2} (ref >=60)

## 2020-07-18 LAB — COMPLETE BLOOD COUNT WITH DIFF
% Basophils: 0.7 %
% Eosinophils: 1.4 %
% Monocytes: 15.8 %
% Neutrophils: 60.2 %
Abs Basophils: 20 cells/uL (ref 0–200)
Abs Eosinophils: 39 cells/uL (ref 15–500)
Abs Lymphocytes: 613 cells/uL — ABNORMAL LOW (ref 850–3900)
Abs Monocytes: 442 cells/uL (ref 200–950)
Abs Neutrophils: 1686 cells/uL (ref 1500–7800)
Hematocrit: 50.3 % — ABNORMAL HIGH (ref 38.5–50.0)
Hemoglobin: 17 g/dL (ref 13.2–17.1)
Lymphs: 21.9 %
MCH: 26.7 pg — ABNORMAL LOW (ref 27.0–33.0)
MCHC: 33.8 g/dL (ref 32.0–36.0)
MCV: 79 fL — ABNORMAL LOW (ref 80.0–100.0)
MPV: 11 fL (ref 7.5–12.5)
Platelet Count: 261 10*3/uL (ref 140–400)
RBC Count: 6.37 10*6/uL — ABNORMAL HIGH (ref 4.20–5.80)
RDW: 14.5 % (ref 11.0–15.0)
WBC Count: 2.8 10*3/uL — ABNORMAL LOW (ref 3.8–10.8)

## 2020-07-18 LAB — TACROLIMUS, HIGHLY SENSITIVE,: Tacrolimus: 10.6 mcg/L

## 2020-07-18 LAB — CYTOMEGALOVIRUS DNA, QUANTITAT
CMV DNA, Quant. PCR: NOT DETECTED IU/mL
CMV DNA, Quantitative PCR (log: NOT DETECTED Log IU/mL

## 2020-07-18 LAB — MAGNESIUM, SERUM / PLASMA: Magnesium, Serum / Plasma: 1.4 mg/dL — ABNORMAL LOW (ref 1.5–2.5)

## 2020-07-18 LAB — PHOSPHORUS, SERUM / PLASMA: Phosphorus, Serum / Plasma: 3.2 mg/dL (ref 2.5–4.5)

## 2020-07-18 MED ORDER — AMLODIPINE 10 MG TABLET
10 | ORAL_TABLET | Freq: Every day | ORAL | 0 refills | Status: AC
Start: 2020-07-18 — End: ?

## 2020-07-18 NOTE — Progress Notes (Signed)
26M, ESRD d/t FSGS/HTN, s/p DDRT 05/20/2019. cPRA 10% KDPI 38%    Next KTU appt: 08/18/2020 Sandi Carne, MD    Creatinine: 1.38    IS Medications: Tacrolimus  11/10, goal 7-9, MMF 250 mg BID (d/t low WBC), Pred 5 mg BID    Issues:  Tac 10.6    RN action:  Called patient, no answer. PHI-sensitive message left with office number, requesting call back to discuss above issue(s) or respond to [facility] msg also left with triage questions.

## 2020-07-19 NOTE — H&P (Signed)
Received VM from pt requesting call back. Attempted to call pt back. Left VM and sent [facility] message.

## 2020-07-20 NOTE — Telephone Encounter (Addendum)
Confirm with Cody Myers pt currently has refills can be mail to him by tomorrow and pt will receive prednisone by Friday. They also notice his local [facility] is charging co-payment Cody confirm they should not be paying co-payment prednisone is cover. Per Cody Pt has [facility] and local [facility] in [place].  Left a VM to patient, his son [name].

## 2020-07-21 MED ORDER — PREDNISONE 5 MG TABLET
5 | ORAL_TABLET | Freq: Every day | ORAL | 11 refills | 30.00000 days | Status: DC
Start: 2020-07-21 — End: 2021-03-30

## 2020-07-21 NOTE — Telephone Encounter (Signed)
Left a VM to pt regarding refills prednisone

## 2020-08-03 MED ORDER — MYCOPHENOLATE MOFETIL 250 MG CAPSULE
250 | ORAL_CAPSULE | Freq: Two times a day (BID) | ORAL | 11 refills | 30.00000 days | Status: DC
Start: 2020-08-03 — End: 2021-03-14

## 2020-08-09 NOTE — Progress Notes (Signed)
05/20/2019 (Kidney) ,  ESRD:  2/2 FSGS/ HTN   CPRA: 10%   KDPI:  38%     Next KTU appt: 08/18/2020 Sandi Carne, MD    Creatinine: 1.51, prev 1.38    IS Meds:   Tac 11/10 goal 7-9 MMF 250/500 (low WBC) Pred 5    Issue(s):  08/05/20 High tac level 15.7;  K 3.1 (no diuretics)    denies s/s tac tox, denies diarrhea  Dose/trough verified    Prev levels: (07/15/20) 10.6, (06/15/2020) 11.2  Last dose change: ~mid 2021   New medication: no   Illnesses: no   Hydration: approx 64 oz water / day and green tea       RN action:   Called patient. Sent to Dr. Lequita Asal for review.     Dr. Lorelee Market Plan-of-Care:   Reduce the tacrolimus to 9 mg BID   Add K-Dur 20 MEQ daily   Repeat labs in one week.   Thanks,     RN action:   [facility] message sent to patient with plan-of-care.            Medication pended for Dr. Lorelee Market signature.         -------------------------ADDENDUM---------------------------  08/11/20 Pt returned call.     ----------------------------------------------------------------------    Tac level 08/05/20 15.7    Called pt, no answer. Called son stated that he will have pt call back RN.

## 2020-08-10 LAB — COMPLETE BLOOD COUNT WITH DIFF
% Basophils: 0.6 %
% Eosinophils: 1.2 %
% Monocytes: 15.9 %
% Neutrophils: 62.6 %
Abs Basophils: 20 cells/uL (ref 0–200)
Abs Eosinophils: 41 cells/uL (ref 15–500)
Abs Lymphocytes: 670 cells/uL — ABNORMAL LOW (ref 850–3900)
Abs Monocytes: 541 cells/uL (ref 200–950)
Abs Neutrophils: 2128 cells/uL (ref 1500–7800)
Hematocrit: 48.5 % (ref 38.5–50.0)
Hemoglobin: 16.1 g/dL (ref 13.2–17.1)
Lymphs: 19.7 %
MCH: 26.4 pg — ABNORMAL LOW (ref 27.0–33.0)
MCHC: 33.2 g/dL (ref 32.0–36.0)
MCV: 79.6 fL — ABNORMAL LOW (ref 80.0–100.0)
MPV: 10.7 fL (ref 7.5–12.5)
Platelet Count: 306 10*3/uL (ref 140–400)
RBC Count: 6.09 10*6/uL — ABNORMAL HIGH (ref 4.20–5.80)
RDW: 14.6 % (ref 11.0–15.0)
WBC Count: 3.4 10*3/uL — ABNORMAL LOW (ref 3.8–10.8)

## 2020-08-10 LAB — BASIC METABOLIC PANEL (NA, K,
BUN/Creatinine Ratio: 13 (calc) (ref 6–22)
Calcium, total, Serum / Plasma: 9.6 mg/dL (ref 8.6–10.3)
Carbon Dioxide, Total: 26 mmol/L (ref 20–32)
Chloride, Serum / Plasma: 101 mmol/L (ref 98–110)
Creatinine, Serum / Plasma: 1.51 mg/dL — ABNORMAL HIGH (ref 0.60–1.35)
Glucose: 86 mg/dL (ref 65–99)
Potassium, Serum / Plasma: 3.1 mmol/L — ABNORMAL LOW (ref 3.5–5.3)
Sodium, Serum / Plasma: 140 mmol/L (ref 135–146)
Urea Nitrogen, Serum / Plasma: 19 mg/dL (ref 7–25)
eGFR - high estimate: 64 mL/min/{1.73_m2} (ref >=60)
eGFR - low estimate: 55 mL/min/{1.73_m2} — ABNORMAL LOW (ref >=60)

## 2020-08-10 LAB — CYTOMEGALOVIRUS DNA, QUANTITAT
CMV DNA, Quant. PCR: NOT DETECTED IU/mL
CMV DNA, Quantitative PCR (log: NOT DETECTED Log IU/mL

## 2020-08-10 LAB — MAGNESIUM, SERUM / PLASMA: Magnesium, Serum / Plasma: 1.6 mg/dL (ref 1.5–2.5)

## 2020-08-10 LAB — PROTEIN, TOTAL, RANDOM URINE
Creatinine, Random Urine: 148 mg/dL (ref 20–320)
Prot Concentration,UR: 35 mg/dL — ABNORMAL HIGH (ref 5–25)
Protein/Creat Ratio, Urine: 236 mg/g creat — ABNORMAL HIGH (ref 22–128)
Protein/Creatinine Ratio, Rand: 0.236 mg/mg creat — ABNORMAL HIGH (ref 0.022–0.128)

## 2020-08-10 LAB — TACROLIMUS, HIGHLY SENSITIVE,: Tacrolimus: 15.7 mcg/L

## 2020-08-10 LAB — PHOSPHORUS, SERUM / PLASMA: Phosphorus, Serum / Plasma: 3.2 mg/dL (ref 2.5–4.5)

## 2020-08-10 NOTE — Telephone Encounter (Signed)
LogEvent: [id number] CASFMS  Call #: [phone]  Time Sent: 08/10/2020 11:44 MT    Caller Name: Brenin Heidelberger  Tel: [phone] Ext:   From: post kidney     Message Type: General Message  Message: Pt. Name: Cody Myers  DOB: 1976/01/13  Organ: kidney  Pre or Post. post  Are you a Mikael Spray or Anton Pt.? [place]  Date of transplant (if applicable): 2019  RE: patient returning a call yesterday, wants to know what it was about, requesting call back Non-Urgent.   Callback number: [phone]    For Organization: [place] (Business Hours)  Name:   Role:       [employer]    LogEvent: [id number]

## 2020-08-11 MED ORDER — TACROLIMUS 1 MG CAPSULE, IMMEDIATE-RELEASE
1 mg | ORAL_CAPSULE | ORAL | 11 refills | Status: DC
Start: 2020-08-11 — End: 2020-12-20

## 2020-08-11 MED ORDER — POTASSIUM CHLORIDE ER 20 MEQ TABLET,EXTENDED RELEASE(PART/CRYST)
20 | ORAL_TABLET | Freq: Every day | ORAL | 0 refills | 63.00000 days | Status: DC
Start: 2020-08-11 — End: 2020-12-02

## 2020-08-11 MED ORDER — TACROLIMUS 5 MG CAPSULE, IMMEDIATE-RELEASE
5 | ORAL_CAPSULE | ORAL | 11 refills | 30.00000 days | Status: AC
Start: 2020-08-11 — End: 2020-12-20

## 2020-08-18 NOTE — Progress Notes (Deleted)
Reduce the tacrolimus to 9 mg BID   Add K-Dur 20 MEQ daily   Repeat labs in one week.

## 2020-08-26 NOTE — Progress Notes (Signed)
error 

## 2020-08-27 LAB — COMPLETE BLOOD COUNT WITH DIFF
% Basophils: 0.8 %
% Eosinophils: 1.3 %
% Lymphocytes: 21.8 %
% Monocytes: 15.1 %
% Neutrophils: 61 %
Abs Basophils (cells/uL): 30 cells/uL (ref 0–200)
Abs Eosinophils (cells/uL): 49 cells/uL (ref 15–500)
Abs Lymphocytes: 828 cells/uL — ABNORMAL LOW (ref 850–3900)
Abs Monocytes (cells/uL): 574 cells/uL (ref 200–950)
Abs Neutrophils (cells/uL): 2318 cells/uL (ref 1500–7800)
Hematocrit: 48.2 % (ref 38.5–50.0)
Hemoglobin: 15.7 g/dL (ref 13.2–17.1)
MCH: 26.7 pg — ABNORMAL LOW (ref 27.0–33.0)
MCHC: 32.6 g/dL (ref 32.0–36.0)
MCV: 82 fL (ref 80.0–100.0)
MPV: 10.8 fL (ref 7.5–12.5)
Platelet Count: 283 10*3/uL (ref 140–400)
RBC Count: 5.88 10*6/uL — ABNORMAL HIGH (ref 4.20–5.80)
RDW: 14.8 % (ref 11.0–15.0)
WBC Count: 3.8 10*3/uL (ref 3.8–10.8)

## 2020-08-27 LAB — PROTEIN, TOTAL, RANDOM URINE
Creatinine, Random Urine: 297 mg/dL (ref 20–320)
Prot Concentration,UR: 108 mg/dL — ABNORMAL HIGH (ref 5–25)
Protein/Creat Ratio, Urine: 364 mg/g creat — ABNORMAL HIGH (ref 22–128)
Protein/Creatinine Ratio, Rand: 0.364 mg/mg creat — ABNORMAL HIGH (ref 0.022–0.128)

## 2020-08-27 LAB — BASIC METABOLIC PANEL (NA, K,
BUN/Creatinine Ratio: 9 (calc) (ref 6–22)
Calcium, total, Serum / Plasma: 9.8 mg/dL (ref 8.6–10.3)
Carbon Dioxide, Total: 28 mmol/L (ref 20–32)
Chloride, Serum / Plasma: 106 mmol/L (ref 98–110)
Creatinine, Serum / Plasma: 1.58 mg/dL — ABNORMAL HIGH (ref 0.60–1.35)
Glucose: 94 mg/dL (ref 65–99)
Potassium, Serum / Plasma: 3.2 mmol/L — ABNORMAL LOW (ref 3.5–5.3)
Sodium, Serum / Plasma: 142 mmol/L (ref 135–146)
Urea Nitrogen, Serum / Plasma: 15 mg/dL (ref 7–25)
eGFR - high estimate: 61 mL/min/{1.73_m2} (ref >=60)
eGFR - low estimate: 52 mL/min/{1.73_m2} — ABNORMAL LOW (ref >=60)

## 2020-08-27 LAB — MAGNESIUM, SERUM / PLASMA: Magnesium, Serum / Plasma: 1.8 mg/dL (ref 1.5–2.5)

## 2020-08-27 LAB — TACROLIMUS, HIGHLY SENSITIVE,: Tacrolimus: 9.4 mcg/L

## 2020-08-27 LAB — CYTOMEGALOVIRUS DNA, QUANTITAT
CMV DNA, Quant. PCR: NOT DETECTED IU/mL
CMV DNA, Quantitative PCR (log: NOT DETECTED Log IU/mL

## 2020-08-27 LAB — PHOSPHORUS, SERUM / PLASMA: Phosphorus, Serum / Plasma: 3.8 mg/dL (ref 2.5–4.5)

## 2020-09-05 NOTE — Progress Notes (Signed)
Dr. 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 Ua with 3 wbc, 39 rbc (mod blood), protein 30. Wbc 6.8, hgb 16.4 (at baseline 16-17) and plt 332. Tbili 1.4, but otherwise LFTs wnl, CT abd noncon with nonspecific. mild perinephric stranding around kidney transplant. Benadryl, reglan and fluids given. Stool cultures with O&P sent - advised also send C diff, cryptosporidium, adenovirus, CMV and EBV, and rest of GI viral/bacterial/and parasite panels. Also recommended check transplant [place] and urine culture. Pt to be admitted overnight for observation to see if any infectious workup is positive, then repeat labs as oupatient to include tac trough. Has erythrocytosis at baseline on ARB, and does not appear any more pronounced than prior.    Dianna Rossetti, DO  Transplant Nephrology Fellow

## 2020-09-05 NOTE — Progress Notes (Addendum)
09/05/20: Pt going to OSH ER    RN received pt call that he has been having diarrhea since Saturday, weakness,       6-8/10 pain on the allograft site. Rash on his face and neck. Pt is en route to OSH ER, [facility] in [place] with his son.     Advised per below,  When you go to an external ER, let the providers know that you are a kidney transplant patient. You may request that they contact the [place] inpatient kidney team at [phone] for guidance on your kidney transplant-related care. If the providers caring for you wishes to initiate a transfer based on your condition, they will contact our transfer center to request transfer or during the call to our physicians.    -------------------------ADDENDUM---------------------------  RN informed IP team (Dr Barnetta Chapel, Dr Danella Penton and DOD Dr Genene Churn.   ----------------------------------------------------------------------

## 2020-09-05 NOTE — Telephone Encounter (Signed)
LogEvent: [id number] CASFMS  Call #: [phone]  Time Sent: 09/05/2020 13:46 MT    Caller Name: Merik Mignano  Tel: [phone] Ext:   From: Ernesto Rutherford txp    Message Type: General Message  Message: Pt. Name:Cody Myers  DOB:04/01/1976  Organ:KI  Pre or Post.Post  Are you a Neurosurgeon or [place] Pt.?[place]  Date of transplant (if applicable): 05/21/2019 RE:Has a break out on face and neck since thursday, and diarrhea started saturday morning. He is in a lot of pain and would like to know what he needs to do.. go to the ER or what?   Urgent  Callback number:[phone]    For Organization: [facility] (Business Hours)  Name: [facility]  Role:       CONFIDENT[employer]    LogEvent: [id number]

## 2020-09-19 ENCOUNTER — Ambulatory Visit: Admit: 2020-09-19 | Discharge: 2020-10-28 | Payer: MEDICARE | Attending: Nephrology | Primary: Physician

## 2020-09-19 DIAGNOSIS — D751 Secondary polycythemia: Secondary | ICD-10-CM

## 2020-09-19 DIAGNOSIS — H548 Legal blindness, as defined in USA: Secondary | ICD-10-CM

## 2020-09-19 DIAGNOSIS — Z940 Kidney transplant status: Secondary | ICD-10-CM

## 2020-09-19 DIAGNOSIS — Z79899 Other long term (current) drug therapy: Secondary | ICD-10-CM

## 2020-09-19 DIAGNOSIS — I10 Essential (primary) hypertension: Secondary | ICD-10-CM

## 2020-09-19 DIAGNOSIS — R3129 Other microscopic hematuria: Secondary | ICD-10-CM

## 2020-09-19 NOTE — Progress Notes (Signed)
POST-KIDNEY TRANSPLANT NEPHROLOGY PROGRESS NOTE         Subjective      Chief Complaint   Patient presents with    Kidney Transplant Follow-up       History of Present Illness:  [name] is a 45 y.o. year old male S/P kidney transplant in 05/20/2019, being seen for a follow-up visit.     He was admitted 09/05/2020 for diarrhea.Fecal leukocyte was negative.Abdominal pain resolved on presentation.CTAP negative for any acute abnormality.[place] of transplant kidney shows no hydronephrosis.  He had rash on face , allergic reaction now resolved.    He has not received COVID vaccine. He was encouraged to get vaccinated.      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 Acute tubular epithelia injury; see comment.   2. No evidence of rejection, negative C4d stain.   2. No significant tubulointerstitial chronicity.   3. Moderate arteriosclerosis, donor-derived.     Special Stain Summary: (x8) PAS and trichrome special stains were  performed and examined to confirm the diagnosis. The findings are  consistent with our H&E-based light microscopic impression. Six  additional  PAS-stained level sections were evaluated, and no segmentally  sclerotic glomeruli are identified.     DSAs not done      Immunosuppressive management encounter following kidney transplant 05/21/2019     Induction agent/dose: Thymo lite  Regime: Tac/MMF  Corticosteroid plan: maintenance   cPRA: 10%      Transplant follow-up 05/21/2019     Category Problem Type of follow-up needed in clinic? Ordered? Scheduled? Comments   Nursing (e.g., drain/ Foley) HD tunneled line refer to IR for removal when appropriate Needs to be done in clinic weekly dressing changes in HD clinic until can be removed   Lab/ Micro Standard Tac Follow up results   Needs to be ordered None   Imaging studies/ procedures NA NA NA None   Referrals Uroglogy for stent removal NA Already ordered None   Delayed graft function and improving UOP, monitor for resolution        Ureteral stent retained of kidney transplant (CMS code) 05/21/2019     Implant Name Type Inv. Item Serial No. Manufacturer Lot No. LRB No. Used Action   STENT URETHRAL 6FR X 16CM DOUBLE J   [id number] - [id number] Stents-Non Des STENT URETHRAL 6FR X 16CM DOUBLE J   [id number]   [id number] Right 1 Implanted     Referral placed for outpatient stent removal    07/01/19 stent removed.       At risk for opportunistic infections 05/21/2019  Transplant Serology Workup:  Recipient        Lab Results   Component Value Date    HBCAB NEG 12/17/2018    HBSAG NEG 12/17/2018    HBABQ >800 12/17/2018    HCV NEG 12/17/2018    RPR Nonreactive 12/17/2018    CMVAB POS (A) 12/17/2018    VCAM NEG 12/17/2018    EBNA POS (A) 12/17/2018    TOXO NEG 12/17/2018         Donor        Cadaver Donor UNOS ID (no units)   Date Value   05/20/2019 [id number]     Match ID: [id number]          Induction Plan:   Thymo + steroid maintenance     Plan for PPX:   Toxo prophylaxis: (D +/R - Toxo MISMATCH), Septra DS 1 tab PO Daily x 1 month (06/19/2019), then qMWF x 11 months more (05/19/2020) -- 1 year total for Toxo  Mismatch   PCP prophylaxis: Septra DS as per above for Toxo ppx   CMV prophylaxis: (D +/R +), Valcyte 900 mg PO Daily (adjusted for renal function) x 3 months (08/18/2019)   Fungal prophylaxis: Fluconazole 100 mg PO q7days x 1 month (06/18/2019)  HBV prophylaxis: Not necessary            At risk of infection transmitted from donor 05/21/2019     Patient underwent transplant with no known donor derived risk factors: Donor is not CDC high risk, not HBV core ab + and there are no known donor derived infections. No need for treatment or monitoring beyond the usual opportunistic infection prophylaxis per protocol. Donor with no known malignancies.  The donor's CMV status is IgG positive and the recipient's CMV status is IgG positive.  Prophylaxis will be determined by risk of CMV transmission.                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     Dialysis Type: HD Hemodialysis  Dialysis Center: [facility]         Phone: [phone]        Fax: [phone]       Address: 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  Been on HD since 2010.  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 Metabolic panel  2. Complete blood count  3. Urine chemistry including level of proteinuria  4. Level of immunosuppressive drug (tacrolimus).          Lab Results    Component Value Date  Creatinine 1.51 (H) 12/24/2019    Creatinine 2.12 (H) 06/15/2019    Creatinine 2.52 (H) 06/09/2019    Creatinine, Serum / Plasma 1.58 (H) 08/25/2020    Creatinine, Serum / Plasma 1.51 (H) 08/05/2020    Creatinine, Serum / Plasma 1.38 (H) 07/15/2020    Urea Nitrogen, Serum / Plasma 15 08/25/2020    Urea Nitrogen, Serum / Plasma 19 08/05/2020    Urea Nitrogen, Serum / Plasma 15 07/15/2020    Sodium, Serum / Plasma 142 08/25/2020    Sodium, Serum / Plasma 140 08/05/2020    Sodium, Serum / Plasma 142 07/15/2020    Potassium, Serum / Plasma 3.2 (L) 08/25/2020    Potassium, Serum / Plasma 3.1 (L) 08/05/2020    Potassium, Serum / Plasma 3.4 (L) 07/15/2020    Carbon Dioxide, Total 28 08/25/2020    Carbon Dioxide, Total 26 08/05/2020    Carbon Dioxide, Total 26 07/15/2020    Calcium, total, Serum / Plasma 9.8 08/25/2020    Calcium, total, Serum / Plasma 9.6 08/05/2020    Calcium, total, Serum / Plasma 9.8 07/15/2020    Phosphorus, Serum / Plasma 3.8 08/25/2020    Phosphorus, Serum / Plasma 3.2 08/05/2020    Phosphorus, Serum / Plasma 3.2 07/15/2020     Lab Results   Component Value Date    WBC Count 3.8 08/25/2020    WBC Count 3.4 (L) 08/05/2020    WBC Count 2.8 (L) 07/15/2020    Hemoglobin 15.7 08/25/2020    Hemoglobin 16.1 08/05/2020    Hemoglobin 17.0 07/15/2020    Hematocrit 48.2 08/25/2020    Hematocrit 48.5 08/05/2020    Hematocrit 50.3 (H) 07/15/2020    MCV 82.0 08/25/2020    MCV 79.6 (L) 08/05/2020    MCV 79.0 (L) 07/15/2020    Platelet Count 283 08/25/2020    Platelet Count 306 08/05/2020    Platelet Count 261 07/15/2020       Lab Results   Component Value Date    Tacrolimus 9.4 08/25/2020    Tacrolimus 15.7 08/05/2020    Tacrolimus 10.6 07/15/2020    Tacrolimus 5.9 06/15/2019    Tacrolimus 5.2 06/09/2019    Tacrolimus 5.0 06/04/2019          Assessment and Plan       Deceased-donor kidney transplant recipient  S/p DDRT 05/20/2019; 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  He will repeat labs next week.  No labs since admission for diarrhea.    Immunosuppressive management encounter following kidney transplant  cPRA 10%.  Tacrolimus9 mgin am and 9 mg in pmwith goal trough6-8ng/ml.  MMF 250 mg twice a day.  Continue prednisone5 mg daily.  Results of allograft biopsyon 7/8/2021showed no rejection, DSA neg.      HTN (hypertension)  BP controlled    Microhematuria:  CT imaging showing no renal lesions.  He was planned for cystoscopy which had to be rescheduled as patient was not able to come to [place].  He wants to schedule it now.      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  CT showed no renal lesions.      Visit Diagnoses and Associated Orders Placed:  1. Deceased-donor kidney transplant recipient     2. Immunosuppressive management encounter following kidney transplant     3. Legally blind     4. Primary hypertension     5. Post-transplant erythrocytosis  6. Microhematuria        No orders of the defined types were placed in this encounter.         Health Care Maintenance:   Recommend usual age- and sex-appropriate cancer screening plus annual dermatology visit for skin cancer surveillance   Recommend age and disease-appropriate vaccinations (killed vaccines only) including annual influenza vaccine. Live vaccines are contraindicated.     Risk Assessment: The patient is at increased risk of complications including drug toxicity, infection and malignancy as a result of immunosuppression for his kidney transplant and requires close monitoring of immunosuppression levels and dose adjustment to manage toxicities.   There is no evidence of immunosuppressive drug toxicity at this visit.                        The patient requested this visit. The visit is in place of a face to face visit. The patient does not have access to or the ability to use the tools  necessary to participate in a video visit and therefore consented to conduct this visit by telephone only. I spent a total of 40 minutes in audio communication with this patient.

## 2020-09-20 NOTE — Progress Notes (Signed)
OUTPATIENT KIDNEY/PANCREAS TRANSPLANT  SOCIAL WORK PROGRESS NOTE:    Data:  Patient had DDRT on 05/20/2019.  SW received referral from Dr. Michaelle Birks.  Patient inquiring about [facility] RN who had been coming to his home to do blood draws, but is no longer doing so.    SW phoned patient to follow up.  Patient stated that [facility] RN had been coming about once a month to draw blood since his discharge from hospital for transplant surgery in December 2020, but RN stopped coming a few months ago.  Patient unsure why HH had been ordered, and by whom, and confirmed he had been going to lab to do blood draws as well.  SW to follow up with treatment team and call patient back.    Assessment:  Patient unclear why HH RN is no longer coming to home to do blood draws.      Interventions/Plan:  SW sent email to CM to ask about HH order and expiration date, and will follow up with patient afterwards.      Helen Cuff, LCSW  Kidney and Pancreas Transplant  PH: [phone]

## 2020-09-29 NOTE — Progress Notes (Signed)
OUTPATIENT KIDNEY/PANCREAS TRANSPLANT  SOCIAL WORK PROGRESS NOTE:    Data:  Patient had DDRT on 05/20/2019.  SW phoned patient to follow up on previous referral regarding HH lab draws (see 09/20/20 SW note.)  SW consulted with KTU CM who could not determine why patient was receiving [facility] lab draws, since they are not typically ordered by KTU team.  CM wondered if PCP or other MD set it up.  CM suggested patient either let it go since he is obtaining labs at local lab, or follow up with [facility] company directly.    SW phoned patient and relayed above information.  SW provided contact information for [facility] company:  [facility], [phone].  Patient's son wrote down information for patient and patient plans to contact them.    Assessment:  Patient seeking information about discontinuation of HH blood draws.    Interventions/Plan:  SW consulted with KTU CM and provided information to patient about [facility] agency that provided service.  Patient to contact them to follow up, and SW will continue to be available for follow up as needed.     Rudolf Blizard, LCSW  Kidney and Pancreas Transplant  PH: [phone]

## 2020-10-24 NOTE — Progress Notes (Unsigned)
Urology New Patient Note    History of Present Illness: Cody Myers was seen in the Urology faculty practice clinic at [facility] as a new patient in a consultation at the request of Dr. Michaelle Birks for evaluation of hematuria.    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 He has a history of microscopic hematuria dating back to July 2021. Of note he had a kidney biopsy at the time, but hematuria did not resolve a month later.     He is legally blind and unable to state whether he has seen any gross hematuria.     Denies any history of UTI. He had gonorrhea 4 years ago, but was treated.     He has a history tobacco use; patient reports approximately 17 pack years of tobacco use, quit 11 years.  Denies history of other exposures to toxins, pesticides, chemicals, heavy metals. Denies history of GU Cancers.    Had a cyst on testicle several years ago that was evaluated by urology and found to be benign    He was on dialysis for 11 years (last Jul 01 2019) and has known cysts on native kidneys    The patient's past medical, surgical, medication, allergy, family, and social histories were reviewed and noncontributory to this illness/condition except as noted below:    The patient's past medical history is notable for:  Past Medical History:   Diagnosis Date    Asthma     Inhalers    Back pain     Blood transfusion without reported diagnosis     BMI 35.0-35.9,adult     Height 6 foot even - weight 260 pounds for BMI 36.      Chronic kidney disease 2009    ESRD due FSGS / DM.    Need to clarify FSGS by biopsy.  Noted in evaluation with [facility] but no further documentation of FSGS by Nephrology.     Depression     not taking medication    GERD (gastroesophageal reflux disease)     Gout     Heart murmur     Hypercholesterolemia     Hypertension     2018 BP dropping on dialysis - MD ordered Midodrine 5 mg for SBD <100    Legally blind 2010    Conjunctiva disorder with prolapse right eye.  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    MRSA (methicillin resistant Staphylococcus aureus)     Sleep apnea     Thromboembolism (CMS code)     Dialysis graft only        The patient's past surgical history is notable for:  Past Surgical History:   Procedure Laterality Date    AV FISTULA PLACEMENT Right 2016    BACK SURGERY      Bilateral Eye Surgery      CENTRAL VENOUS CATHETER  2015    Place and removed once fistula working    COLONOSCOPY  03/2018    Negative for polyps - no specimens taken    DIALYSIS FISTULA CREATION Left 2010    Revision 2014    IR DIALYSIS FISTULAGRAM (ORDERABLE BY IR SERVICE ONLY)  05/22/2019    IR DIALYSIS FISTULAGRAM (ORDERABLE BY IR SERVICE ONLY) 05/22/2019 Jenita Seashore, MD RAD IR [facility]    TOOTH EXTRACTION  2018    Wound Vac  2015    Dehiscence anterior torso - chest       The patient is  currently taking the following medications:  Current Outpatient Medications   Medication Sig Dispense Refill    acetaminophen (TYLENOL) 500 mg tablet Take 2 tablets (1,000 mg total) by mouth every 8 (eight) hours as needed for Pain (mild pain) Max = 3000 mg/day 50 tablet 0    albuterol 90 mcg/actuation metered dose inhaler Inhale 1-2 puffs into the lungs every 6 (six) hours as needed for Wheezing or Shortness of Breath         amLODIPine (NORVASC) 10 mg tablet Take 1 tablet (10 mg total) by mouth daily For additional refills please contact your family doctor. 90 tablet 0    docusate sodium (COLACE) 250 mg capsule Take 1 capsule (250 mg total) by mouth Twice a day Hold for loose stools 100 capsule 0    losartan (COZAAR) 25 mg tablet Take 2 tablets (50 mg total) by mouth daily 30 tablet 11    mycophenolate (CELLCEPT) 250 mg capsule Take 1 capsule (250 mg total) by mouth Twice a day 60 capsule 11    omeprazole (PRILOSEC) 20 mg capsule Take 20 mg by mouth in the morning and at bedtime      oxyCODONE (ROXICODONE) 5 mg tablet Take 1 tablet (5 mg total) by mouth every 6 (six) hours as needed  (severe pain) (Patient taking differently: Take 10 mg by mouth every 6 (six) hours as needed (severe pain)   ) 8 tablet 0    oxyCODONE-acetaminophen (PERCOCET) 10-325 mg tablet Take 1 tablet by mouth every 8 (eight) hours as needed for Pain (Lower back pain)         potassium chloride (KDUR; KLOR-CON) 20 mEq tablet Take 1 tablet (20 mEq total) by mouth daily 30 tablet 0    predniSONE (DELTASONE) 5 mg tablet Take 1 tablet (5 mg total) by mouth daily . 30 tablet 11    senna (SENOKOT) 8.6 mg tablet Take 2 tablets (17.2 mg total) by mouth nightly as needed for Constipation 100 tablet 0    tacrolimus (PROGRAF) 1 mg capsule Take 9 mg in the AM and 9mg  in the PM,  (1mg  and 5mg  ordered separately) 240 capsule 11    tacrolimus (PROGRAF) 5 mg capsule Take 9mg  in AM and 9 mg in PM (1 mg and 5mg  ordered separately) 60 capsule 11     No current facility-administered medications for this visit.       The patient is allergic to:     Allergies/Contraindications   Allergen Reactions    Aspirin Other (See Comments)     Bleeding ulcers; Has stomach bleeding         The patient's past family  history is notable for:  Family History   Problem Relation Name Age of Onset    Diabetes Mother      Heart disease Mother      Hypertension Mother      Diabetes Father      Heart disease Father      Hypertension Father          The patient's past social  history is notable for:  Social History     Socioeconomic History    Marital status: Single     Spouse name: Not on file    Number of children: 4    Years of education: 12    Highest education level: Associate degree: occupational, Scientist, product/process developmenttechnical, or vocational program   Occupational History    Occupation: Disabled Naval architectTruck Driver   Tobacco Use    Smoking  status: Former Smoker     Packs/day: 1.00     Years: 4.00     Pack years: 4.00     Types: Cigarettes     Quit date: 06/19/2007     Years since quitting: 13.3    Smokeless tobacco: Never Used   Substance and Sexual Activity    Alcohol  use: Not Currently    Drug use: Not Currently    Sexual activity: Not Currently   Other Topics Concern    Not on file   Social History Narrative    Not on file     Social Determinants of Health     Financial Resource Strain:     Difficulty of Paying Living Expenses: Not on file   Food Insecurity:     Worried About Programme researcher, broadcasting/film/video in the Last Year: Not on file    [employer] of Food in the Last Year: Not on file   Transportation Needs:     Lack of Transportation (Medical): Not on file    Lack of Transportation (Non-Medical): Not on file        Review of systems was performed and reviewed today.        During the physical examination today, the patients vital signs were  There were no vitals taken for this visit.     General: Alert and oriented times three, pleasant, in no acute distress    Chest: Breathing is unlabored       The following lab results were personally reviewed by me:    Objective:      Lab Review  Lab Results   Component Value Date    WBC Count 3.8 08/25/2020    Hemoglobin 15.7 08/25/2020    Hematocrit 48.2 08/25/2020    MCV 82.0 08/25/2020    Platelet Count 283 08/25/2020      Lab Results   Component Value Date    Calcium, total, Serum / Plasma 9.8 08/25/2020      Lab Results   Component Value Date    Sodium, Serum / Plasma 142 08/25/2020    Potassium, Serum / Plasma 3.2 (L) 08/25/2020    Chloride, Serum / Plasma 106 08/25/2020    Carbon Dioxide, Total 28 08/25/2020      Lab Results   Component Value Date    Creatinine 1.51 (H) 12/24/2019    Creatinine, Serum / Plasma 1.58 (H) 08/25/2020      No results found for: POCTLEUK, POCTNITRITE, POCTPROTEIN, POCTPHUR, POCTRBCUR, POCTSPECGRAV, POCTKETONESU, POCTBILIUR, POCTGLUCUR    Results for orders placed or performed during the hospital encounter of 05/20/19   Urine Culture    Specimen: Urine, Midstream    URINE FROM BLADDER   Result Value Ref Range    Comments       Collected during surgical procedure.  CALL: [id number]      Bacterial Culture, Urine  w/o gram stain No growth 2 days.        No results found for this or any previous visit.  Hemoglobin A1c (% of total Hgb)   Date Value   11/10/2019 5.2       No results found for: PSA, PSAS, PSAEXT, PSATOT, PSAU        Imaging: The patient had a CT performed on 01/06/20. These images were reviewed and interpreted by me personally today. This demonstrates non-definable lesions on native kidneys (known cysts seen on prior ultrasound)      Assessment and Plan: It was a pleasure  seeing Cody Myers in clinic today.     I addressed hematuria with the patient today.    We discussed the potential etiologies of hematuria, including but not limited to infection, lithiasis, trauma, and neoplasm.  I advised the patient that the standard hematuria work up consists of both upper tract imaging (RBUS, CT urogram, or similar) and lower urinary tract visualization with cystoscopy.  Cytology is useful in some cases.  Although an etiology for bleeding is not identified in all cases this evaluation is typically sufficient to detect any form of serious pathology.    Based on their symptoms of < 17 pack year smoking history and RBC between 10-25 they are considered the intermediate risk category indicating that a RBUS and cystoscopy are indicated.    Given that recent UA was around the time of biopsy I will plan to repeat along with reflex urine culture (ucx in July was negative).     We will also plan to repeat RBUS given that CT was several months ago and non specific and we would like to avoid a contrast load in setting of transplant.    Will schedule in office cystoscopy. I described flexible cystoscopy and advised of the small but finite risks including infection, dysuria, and discomfort.  After discussing the risks and indications for cystoscopy the patient agrees to proceed with cystoscopy.    The patient was instructed to call our clinic nursing line at [phone] or the main line at [phone] with any questions or go to  nearest emergency room should there be any fevers, chills, nausea, vomiting, worsening pain, chest pain, or inability to void.    Thank you for allowing [place] to participate in the care of this patient. Please do not hesitate to contact us should you have any questions or concerns.    Allena Earing, MD   Assistant Professor of Urology  Department of Urology  [place] [facility] [place], [place]      I performed this evaluation using real-time telehealth tools, including a live video Zoom connection between my location and the patient's location. Prior to initiating, the patient consented to perform this evaluation using telehealth tools. My location is in a [place] clinical facility.

## 2020-11-08 NOTE — H&P (Signed)
Standing lab orders renewed at [facility] exp 05/11/21. Spoke with pt. Pt said he is to have weekly order until next visit. Depending on lab results, weekly order may change to monthly. Orders renewed for weekly and quarterly at [facility].

## 2020-11-11 LAB — MAGNESIUM, SERUM / PLASMA: Magnesium, Serum / Plasma: 1.7 mg/dL (ref 1.5–2.5)

## 2020-11-11 LAB — COMPLETE BLOOD COUNT WITH DIFFERENTIAL
% Basophils: 0.7 %
% Eosinophils: 2.4 %
% Lymphocytes: 24.7 %
% Monocytes: 15.4 %
% Neutrophils: 56.8 %
Abs Basophils (cells/uL): 29 cells/uL (ref 0–200)
Abs Eosinophils (cells/uL): 101 cells/uL (ref 15–500)
Abs Lymphocytes: 1037 cells/uL (ref 850–3900)
Abs Monocytes (cells/uL): 647 cells/uL (ref 200–950)
Abs Neutrophils (cells/uL): 2386 cells/uL (ref 1500–7800)
Hematocrit: 49.1 % (ref 38.5–50.0)
Hemoglobin: 15.9 g/dL (ref 13.2–17.1)
MCH: 26.4 pg — ABNORMAL LOW (ref 27.0–33.0)
MCHC: 32.4 g/dL (ref 32.0–36.0)
MCV: 81.4 fL (ref 80.0–100.0)
MPV: 10.9 fL (ref 7.5–12.5)
Platelet Count: 282 10*3/uL (ref 140–400)
RBC Count: 6.03 10*6/uL — ABNORMAL HIGH (ref 4.20–5.80)
RDW: 14.4 % (ref 11.0–15.0)
WBC Count: 4.2 10*3/uL (ref 3.8–10.8)

## 2020-11-11 LAB — BASIC METABOLIC PANEL (NA, K, CL, CO2, BUN, CR, GLU, CA)
BUN/Creatinine Ratio: 14 (calc) (ref 6–22)
Calcium, total, Serum / Plasma: 9.8 mg/dL (ref 8.6–10.3)
Carbon Dioxide, Total: 27 mmol/L (ref 20–32)
Chloride, Serum / Plasma: 104 mmol/L (ref 98–110)
Creatinine, Serum / Plasma: 1.81 mg/dL — ABNORMAL HIGH (ref 0.60–1.35)
Glucose: 93 mg/dL (ref 65–99)
Potassium, Serum / Plasma: 3.1 mmol/L — ABNORMAL LOW (ref 3.5–5.3)
Sodium, Serum / Plasma: 142 mmol/L (ref 135–146)
Urea Nitrogen, Serum / Plasma: 25 mg/dL (ref 7–25)
eGFR - high estimate: 51 mL/min/{1.73_m2} — ABNORMAL LOW (ref >=60)
eGFR - low estimate: 44 mL/min/{1.73_m2} — ABNORMAL LOW (ref >=60)

## 2020-11-11 LAB — TACROLIMUS, HIGHLY SENSITIVE, LC/MS/MS: Tacrolimus: 8 mcg/L

## 2020-11-11 LAB — PHOSPHORUS, SERUM / PLASMA: Phosphorus, Serum / Plasma: 3.7 mg/dL (ref 2.5–4.5)

## 2020-11-11 NOTE — Progress Notes (Incomplete)
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   CPRA: 10%      Next KTU appt: 12/22/2020 Sandi Carne, MD    IS Meds:  Tac 9/9 Tac  goal6-8 MMF 250/250 (low WBC) Pred 5       Issue(s):  Creatinine: 1.81, 1.58, 1.51 baseline 1.5-1.7  Tac pending 12 hr level  Denies n/v, diarrhea, UTIs/sx, allograft pain  Hydration: 2-3L/day       RN action:   Called patient. Sent to Dr. {Nephrologists:[id number]} for review.     Dr. {Nephrologists:[id number]}'s Plan-of-Care:       RN action:   [facility] message sent to patient with plan-of-care.            Medication pended for Dr. {Nephrologists:[id number]}'s signature.

## 2020-12-02 LAB — COMPLETE BLOOD COUNT WITH DIFFERENTIAL
% Basophils: 1.1 %
% Eosinophils: 1.6 %
% Lymphocytes: 21.8 %
% Monocytes: 13.7 %
% Neutrophils: 61.8 %
Abs Basophils (cells/uL): 41 cells/uL (ref 0–200)
Abs Eosinophils (cells/uL): 59 cells/uL (ref 15–500)
Abs Lymphocytes: 807 cells/uL — ABNORMAL LOW (ref 850–3900)
Abs Monocytes (cells/uL): 507 cells/uL (ref 200–950)
Abs Neutrophils (cells/uL): 2287 cells/uL (ref 1500–7800)
Hematocrit: 50.5 % — ABNORMAL HIGH (ref 38.5–50.0)
Hemoglobin: 16.3 g/dL (ref 13.2–17.1)
MCH: 26.4 pg — ABNORMAL LOW (ref 27.0–33.0)
MCHC: 32.3 g/dL (ref 32.0–36.0)
MCV: 81.8 fL (ref 80.0–100.0)
MPV: 10.7 fL (ref 7.5–12.5)
Platelet Count: 261 10*3/uL (ref 140–400)
RBC Count: 6.17 10*6/uL — ABNORMAL HIGH (ref 4.20–5.80)
RDW-CV: 14.9 % (ref 11.0–15.0)
WBC Count: 3.7 10*3/uL — ABNORMAL LOW (ref 3.8–10.8)

## 2020-12-02 LAB — BASIC METABOLIC PANEL (NA, K, CL, CO2, BUN, CR, GLU, CA)
BUN/Creatinine Ratio: 13 (calc) (ref 6–22)
Calcium, total, Serum / Plasma: 9.9 mg/dL (ref 8.6–10.3)
Carbon Dioxide, Total: 25 mmol/L (ref 20–32)
Chloride, Serum / Plasma: 106 mmol/L (ref 98–110)
Creatinine, Serum / Plasma: 1.71 mg/dL — ABNORMAL HIGH (ref 0.60–1.35)
Glucose: 98 mg/dL (ref 65–99)
Potassium, Serum / Plasma: 2.9 mmol/L — ABNORMAL LOW (ref 3.5–5.3)
Sodium, Serum / Plasma: 142 mmol/L (ref 135–146)
Urea Nitrogen, Serum / Plasma: 22 mg/dL (ref 7–25)
eGFR - high estimate: 55 mL/min/{1.73_m2} — ABNORMAL LOW (ref >=60)
eGFR - low estimate: 47 mL/min/{1.73_m2} — ABNORMAL LOW (ref >=60)

## 2020-12-02 LAB — MAGNESIUM, SERUM / PLASMA: Magnesium, Serum / Plasma: 1.8 mg/dL (ref 1.5–2.5)

## 2020-12-02 LAB — PHOSPHORUS, SERUM / PLASMA: Phosphorus, Serum / Plasma: 3.9 mg/dL (ref 2.5–4.5)

## 2020-12-02 LAB — TACROLIMUS, HIGHLY SENSITIVE, LC/MS/MS: Tacrolimus: 10.3 ug/L

## 2020-12-02 MED ORDER — POTASSIUM CHLORIDE ER 20 MEQ TABLET,EXTENDED RELEASE(PART/CRYST)
20 | ORAL_TABLET | ORAL | 3 refills | 63.00000 days | Status: DC
Start: 2020-12-02 — End: 2020-12-22

## 2020-12-02 NOTE — Progress Notes (Signed)
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     Dr Zelphia Cairo plan-of-care: Needs to restart on K tabs. Advise her to take daily for 1 week then take daily.     Continue to take high K foods.   Repeat K in 1-2 weeks.     RN called patient and verbalized understanding of plan-of-care

## 2020-12-06 NOTE — Progress Notes (Addendum)
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 He will repeat labs next week and was educated on proper lab/medication timings. Patient verbalized understanding of plan-of-care

## 2020-12-12 LAB — TITUS CONVERTED DATA - EXTERNAL: Allosure Test - External: 0.24 %

## 2020-12-16 NOTE — Progress Notes (Signed)
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 Patient verbalized understanding of plan-of-care     Medications pended for Dr Laurell Roof signature.

## 2020-12-18 LAB — COMPLETE BLOOD COUNT WITH DIFFERENTIAL
% Basophils: 0.8 %
% Eosinophils: 1.9 %
% Lymphocytes: 29.6 %
% Monocytes: 14 %
% Neutrophils: 53.7 %
Abs Basophils (cells/uL): 30 cells/uL (ref 0–200)
Abs Eosinophils (cells/uL): 72 cells/uL (ref 15–500)
Abs Lymphocytes: 1125 cells/uL (ref 850–3900)
Abs Monocytes (cells/uL): 532 cells/uL (ref 200–950)
Abs Neutrophils (cells/uL): 2041 cells/uL (ref 1500–7800)
Hematocrit: 48.8 % (ref 38.5–50.0)
Hemoglobin: 16 g/dL (ref 13.2–17.1)
MCH: 26.3 pg — ABNORMAL LOW (ref 27.0–33.0)
MCHC: 32.8 g/dL (ref 32.0–36.0)
MCV: 80.1 fL (ref 80.0–100.0)
MPV: 10.4 fL (ref 7.5–12.5)
Platelet Count: 280 10*3/uL (ref 140–400)
RBC Count: 6.09 10*6/uL — ABNORMAL HIGH (ref 4.20–5.80)
RDW-CV: 14.5 % (ref 11.0–15.0)
WBC Count: 3.8 10*3/uL (ref 3.8–10.8)

## 2020-12-18 LAB — BASIC METABOLIC PANEL (NA, K, CL, CO2, BUN, CR, GLU, CA)
BUN/Creatinine Ratio: 11 (calc) (ref 6–22)
Calcium, total, Serum / Plasma: 9.6 mg/dL (ref 8.6–10.3)
Carbon Dioxide, Total: 21 mmol/L (ref 20–32)
Chloride, Serum / Plasma: 110 mmol/L (ref 98–110)
Creatinine, Serum / Plasma: 1.99 mg/dL — ABNORMAL HIGH (ref 0.60–1.35)
Glucose: 103 mg/dL (ref 65–139)
Potassium, Serum / Plasma: 3.3 mmol/L — ABNORMAL LOW (ref 3.5–5.3)
Sodium, Serum / Plasma: 140 mmol/L (ref 135–146)
Urea Nitrogen, Serum / Plasma: 22 mg/dL (ref 7–25)
eGFR - high estimate: 46 mL/min/{1.73_m2} — ABNORMAL LOW (ref >=60)
eGFR - low estimate: 39 mL/min/{1.73_m2} — ABNORMAL LOW (ref >=60)

## 2020-12-18 LAB — PHOSPHORUS, SERUM / PLASMA: Phosphorus, Serum / Plasma: 3.3 mg/dL (ref 2.5–4.5)

## 2020-12-18 LAB — TACROLIMUS, HIGHLY SENSITIVE, LC/MS/MS: Tacrolimus: 14 ug/L

## 2020-12-18 LAB — MAGNESIUM, SERUM / PLASMA: Magnesium, Serum / Plasma: 1.8 mg/dL (ref 1.5–2.5)

## 2020-12-20 MED ORDER — NITROFURANTOIN MONOHYDRATE/MACROCRYSTALS 100 MG CAPSULE
100 | Freq: Once | ORAL | Status: AC
Start: 2020-12-20 — End: ?

## 2020-12-20 MED ORDER — TACROLIMUS 1 MG CAPSULE, IMMEDIATE-RELEASE
1 mg | ORAL_CAPSULE | ORAL | 11 refills | Status: AC
Start: 2020-12-20 — End: 2021-01-02

## 2020-12-20 MED ORDER — LIDOCAINE 2 % MUCOSAL JELLY IN APPLICATOR
2 | Freq: Once | Status: AC
Start: 2020-12-20 — End: ?

## 2020-12-20 MED ORDER — TACROLIMUS 5 MG CAPSULE, IMMEDIATE-RELEASE
5 mg | ORAL_CAPSULE | ORAL | 11 refills | Status: DC
Start: 2020-12-20 — End: 2021-01-02

## 2020-12-20 MED ORDER — SODIUM CHLORIDE 0.9 % BLADDER IRRIGATION SOLUTION
0.9 | Freq: Once | Status: AC
Start: 2020-12-20 — End: ?

## 2020-12-20 NOTE — Progress Notes (Incomplete)
Urology New Patient Note    History of Present Illness: Cody Myers was seen in the Urology faculty practice clinic at [facility] as a new patient in a consultation at the request of Dr. Michaelle Birks for evaluation of hematuria.    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 He has a history of microscopic hematuria dating back to July 2021. Of note he had a kidney biopsy at the time, but hematuria did not resolve a month later.     He is legally blind and unable to state whether he has seen any gross hematuria.     Denies any history of UTI. He had gonorrhea 4 years ago, but was treated.     He has a history tobacco use; patient reports approximately 17 pack years of tobacco use, quit 11 years.  Denies history of other exposures to toxins, pesticides, chemicals, heavy metals. Denies history of GU Cancers.    Had a cyst on testicle several years ago that was evaluated by urology and found to be benign    He was on dialysis for 11 years (last Jul 01 2019) and has known cysts on native kidneys    The patient's past medical, surgical, medication, allergy, family, and social histories were reviewed and noncontributory to this illness/condition except as noted below:    The patient's past medical history is notable for:  Past Medical History:   Diagnosis Date    Asthma     Inhalers    Back pain     Blood transfusion without reported diagnosis     BMI 35.0-35.9,adult     Height 6 foot even - weight 260 pounds for BMI 36.      Chronic kidney disease 2009    ESRD due FSGS / DM.    Need to clarify FSGS by biopsy.  Noted in evaluation with [facility] but no further documentation of FSGS by Nephrology.     Depression     not taking medication    GERD (gastroesophageal reflux disease)     Gout     Heart murmur     Hypercholesterolemia     Hypertension     2018 BP dropping on dialysis - MD ordered Midodrine 5 mg for SBD <100    Legally blind 2010    Conjunctiva disorder with prolapse right eye.  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    MRSA (methicillin resistant Staphylococcus aureus)     Sleep apnea     Thromboembolism (CMS code)     Dialysis graft only        The patient's past surgical history is notable for:  Past Surgical History:   Procedure Laterality Date    AV FISTULA PLACEMENT Right 2016    BACK SURGERY      Bilateral Eye Surgery      CENTRAL VENOUS CATHETER  2015    Place and removed once fistula working    COLONOSCOPY  03/2018    Negative for polyps - no specimens taken    DIALYSIS FISTULA CREATION Left 2010    Revision 2014    IR DIALYSIS FISTULAGRAM (ORDERABLE BY IR SERVICE ONLY)  05/22/2019    IR DIALYSIS FISTULAGRAM (ORDERABLE BY IR SERVICE ONLY) 05/22/2019 Jenita Seashore, MD RAD IR [facility]ARN    TOOTH EXTRACTION  2018    Wound Vac  2015    Dehiscence anterior torso - chest       The patient is  currently taking the following medications:  Current Outpatient Medications   Medication Sig Dispense Refill    acetaminophen (TYLENOL) 500 mg tablet Take 2 tablets (1,000 mg total) by mouth every 8 (eight) hours as needed for Pain (mild pain) Max = 3000 mg/day 50 tablet 0    albuterol 90 mcg/actuation metered dose inhaler Inhale 1-2 puffs into the lungs every 6 (six) hours as needed for Wheezing or Shortness of Breath         amLODIPine (NORVASC) 10 mg tablet Take 1 tablet (10 mg total) by mouth daily For additional refills please contact your family doctor. 90 tablet 0    docusate sodium (COLACE) 250 mg capsule Take 1 capsule (250 mg total) by mouth Twice a day Hold for loose stools 100 capsule 0    losartan (COZAAR) 25 mg tablet Take 2 tablets (50 mg total) by mouth daily 30 tablet 11    mycophenolate (CELLCEPT) 250 mg capsule Take 1 capsule (250 mg total) by mouth Twice a day 60 capsule 11    omeprazole (PRILOSEC) 20 mg capsule Take 20 mg by mouth in the morning and at bedtime      oxyCODONE (ROXICODONE) 5 mg tablet Take 1 tablet (5 mg total) by mouth every 6 (six) hours as needed  (severe pain) (Patient taking differently: Take 10 mg by mouth every 6 (six) hours as needed (severe pain)   ) 8 tablet 0    oxyCODONE-acetaminophen (PERCOCET) 10-325 mg tablet Take 1 tablet by mouth every 8 (eight) hours as needed for Pain (Lower back pain)         potassium chloride (KDUR; KLOR-CON) 20 mEq tablet Take daily for 1 week then take daily. 40 tablet 3    predniSONE (DELTASONE) 5 mg tablet Take 1 tablet (5 mg total) by mouth daily . 30 tablet 11    senna (SENOKOT) 8.6 mg tablet Take 2 tablets (17.2 mg total) by mouth nightly as needed for Constipation 100 tablet 0    tacrolimus (PROGRAF) 1 mg capsule Take 9 mg in the AM and 9mg  in the PM,  (1mg  and 5mg  ordered separately) 240 capsule 11    tacrolimus (PROGRAF) 5 mg capsule Take 9mg  in AM and 9 mg in PM (1 mg and 5mg  ordered separately) 60 capsule 11     No current facility-administered medications for this visit.       The patient is allergic to:     Allergies/Contraindications   Allergen Reactions    Aspirin Other (See Comments)     Bleeding ulcers; Has stomach bleeding         The patient's past family  history is notable for:  Family History   Problem Relation Name Age of Onset    Diabetes Mother      Heart disease Mother      Hypertension Mother      Diabetes Father      Heart disease Father      Hypertension Father          The patient's past social  history is notable for:  Social History     Socioeconomic History    Marital status: Single     Spouse name: Not on file    Number of children: 4    Years of education: 12    Highest education level: Associate degree: occupational, , or vocational program   Occupational History    Occupation: Disabled   Tobacco Use  Smoking status: Former Smoker     Packs/day: 1.00     Years: 4.00     Pack years: 4.00     Types: Cigarettes     Quit date: 06/19/2007     Years since quitting: 13.5    Smokeless tobacco: Never Used   Substance and Sexual Activity     Alcohol use: Not Currently    Drug use: Not Currently    Sexual activity: Not Currently   Other Topics Concern    Not on file   Social History Narrative    Not on file     Social Determinants of Health     Financial Resource Strain: Not on file   Food Insecurity: Not on file   Transportation Needs: Not on file        Review of systems was performed and reviewed today.        During the physical examination today, the patients vital signs were  There were no vitals taken for this visit.     General: Alert and oriented times three, pleasant, in no acute distress    Chest: Breathing is unlabored       The following lab results were personally reviewed by me:    Objective:      Lab Review  Lab Results   Component Value Date    WBC Count 3.8 12/15/2020    Hemoglobin 16.0 12/15/2020    Hematocrit 48.8 12/15/2020    MCV 80.1 12/15/2020    Platelet Count 280 12/15/2020      Lab Results   Component Value Date    Calcium, total, Serum / Plasma 9.6 12/15/2020      Lab Results   Component Value Date    Sodium, Serum / Plasma 140 12/15/2020    Potassium, Serum / Plasma 3.3 (L) 12/15/2020    Chloride, Serum / Plasma 110 12/15/2020    Carbon Dioxide, Total 21 12/15/2020      Lab Results   Component Value Date    Creatinine 1.51 (H) 12/24/2019    Creatinine, Serum / Plasma 1.99 (H) 12/15/2020      No results found for: POCTLEUK, POCTNITRITE, POCTPROTEIN, POCTPHUR, POCTRBCUR, POCTSPECGRAV, POCTKETONESU, POCTBILIUR, POCTGLUCUR    Results for orders placed or performed during the hospital encounter of 05/20/19   Urine Culture    Specimen: Urine, Midstream    URINE FROM BLADDER   Result Value Ref Range    Comments       Collected during surgical procedure.  CALL: [id number]      Bacterial Culture, Urine w/o gram stain No growth 2 days.        No results found for this or any previous visit.  Hemoglobin A1c (% of total Hgb)   Date Value   11/10/2019 5.2       No results found for: PSA, PSAS, PSAEXT, PSATOT, PSAU        Imaging: The  patient had a CT performed on 01/06/20. These images were reviewed and interpreted by me personally today. This demonstrates non-definable lesions on native kidneys (known cysts seen on prior ultrasound)      Assessment and Plan: It was a pleasure seeing Cody Myers in clinic today.     I addressed hematuria with the patient today.    We discussed the potential etiologies of hematuria, including but not limited to infection, lithiasis, trauma, and neoplasm.  I advised the patient that the standard hematuria work up consists of both upper tract imaging (  RBUS, CT urogram, or similar) and lower urinary tract visualization with cystoscopy.  Cytology is useful in some cases.  Although an etiology for bleeding is not identified in all cases this evaluation is typically sufficient to detect any form of serious pathology.    Based on their symptoms of < 17 pack year smoking history and RBC between 10-25 they are considered the intermediate risk category indicating that a RBUS and cystoscopy are indicated.    Given that recent UA was around the time of biopsy I will plan to repeat along with reflex urine culture (ucx in July was negative).     We will also plan to repeat RBUS given that CT was several months ago and non specific and we would like to avoid a contrast load in setting of transplant.    Will schedule in office cystoscopy. I described flexible cystoscopy and advised of the small but finite risks including infection, dysuria, and discomfort.  After discussing the risks and indications for cystoscopy the patient agrees to proceed with cystoscopy.    The patient was instructed to call our clinic nursing line at [phone] or the main line at [phone] with any questions or go to nearest emergency room should there be any fevers, chills, nausea, vomiting, worsening pain, chest pain, or inability to void.    Thank you for allowing us to participate in the care of this patient. Please do not hesitate to contact  us should you have any questions or concerns.    Allena Earinghristi Airyonna Franklyn, MD   Assistant Professor of Urology  Department of Urology  [place] [place] [place]      I performed this evaluation using real-time telehealth tools, including a live video Zoom connection between my location and the patient's location. Prior to initiating, the patient consented to perform this evaluation using telehealth tools. My location is in a [place] clinical facility.

## 2020-12-22 ENCOUNTER — Ambulatory Visit: Admit: 2020-12-23 | Discharge: 2021-01-06 | Payer: MEDICARE | Attending: Nephrology | Primary: Physician

## 2020-12-22 DIAGNOSIS — I10 Essential (primary) hypertension: Secondary | ICD-10-CM

## 2020-12-22 DIAGNOSIS — H548 Legal blindness, as defined in USA: Secondary | ICD-10-CM

## 2020-12-22 DIAGNOSIS — R3129 Other microscopic hematuria: Secondary | ICD-10-CM

## 2020-12-22 DIAGNOSIS — Z79899 Other long term (current) drug therapy: Secondary | ICD-10-CM

## 2020-12-22 DIAGNOSIS — E876 Hypokalemia: Secondary | ICD-10-CM

## 2020-12-22 DIAGNOSIS — D751 Secondary polycythemia: Secondary | ICD-10-CM

## 2020-12-22 DIAGNOSIS — Z940 Kidney transplant status: Secondary | ICD-10-CM

## 2020-12-22 MED ORDER — POTASSIUM CHLORIDE ER 20 MEQ TABLET,EXTENDED RELEASE(PART/CRYST)
20 | ORAL_TABLET | ORAL | 3 refills | 63.00000 days | Status: DC
Start: 2020-12-22 — End: 2021-03-30

## 2020-12-22 NOTE — Progress Notes (Signed)
POST-KIDNEY TRANSPLANT NEPHROLOGY PROGRESS NOTE         Subjective      Chief Complaint   Patient presents with    Kidney Transplant Follow-up       History of Present Illness:  Cody Myers is a 45 y.o. year old male S/P kidney transplant on 05/20/2019, being seen for a follow-up visit.    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 Acute tubular epithelia injury; see comment.   2. No evidence of rejection, negative C4d stain.   2. No significant tubulointerstitial chronicity.   3. Moderate arteriosclerosis, donor-derived.     Special Stain Summary: (x8) PAS and trichrome special stains were  performed and examined to confirm the diagnosis. The findings are  consistent with our H&E-based light microscopic impression. Six  additional PAS-stained level sections were evaluated, and no segmentally  sclerotic glomeruli are identified.     DSAs not done      Immunosuppressive management encounter following kidney transplant 05/21/2019     Induction agent/dose: Thymo lite  Regime: Tac/MMF  Corticosteroid plan: maintenance   cPRA: 10%      Transplant follow-up  05/21/2019     Category Problem Type of follow-up needed in clinic? Ordered? Scheduled? Comments   Nursing (e.g., drain/ Foley) HD tunneled line refer to IR for removal when appropriate Needs to be done in clinic weekly dressing changes in HD clinic until can be removed   Lab/ Micro Standard Tac Follow up results   Needs to be ordered None   Imaging studies/ procedures NA NA NA None   Referrals Uroglogy for stent removal NA Already ordered None   Delayed graft function and improving UOP, monitor for resolution        Ureteral stent retained of kidney transplant (CMS code) 05/21/2019     Implant Name Type Inv. Item Serial No. Manufacturer Lot No. LRB No. Used Action   STENT URETHRAL 6FR X 16CM DOUBLE J   [id number] - [id number] Stents-Non Des STENT URETHRAL 6FR X 16CM DOUBLE J   [id number]   [id number] Right 1 Implanted     Referral placed for outpatient stent removal    07/01/19 stent removed.       At risk for opportunistic infections 05/21/2019     Transplant Serology Workup:  Recipient        Lab Results   Component Value Date    HBCAB NEG 12/17/2018    HBSAG NEG 12/17/2018    HBABQ >800 12/17/2018    HCV NEG 12/17/2018    RPR Nonreactive 12/17/2018    CMVAB POS (  A) 12/17/2018    VCAM NEG 12/17/2018    EBNA POS (A) 12/17/2018    TOXO NEG 12/17/2018         Donor        Cadaver Donor UNOS ID (no units)   Date Value   05/20/2019 [id number]     Match ID: [id number]          Induction Plan:   Thymo + steroid maintenance     Plan for PPX:   Toxo prophylaxis: (D +/R - Toxo MISMATCH), Septra DS 1 tab PO Daily x 1 month (06/19/2019), then qMWF x 11 months more (05/19/2020) -- 1 year total for Toxo Mismatch   PCP prophylaxis: Septra DS as per above for Toxo ppx   CMV prophylaxis: (D +/R +), Valcyte 900 mg PO Daily (adjusted for renal function) x 3 months (08/18/2019)   Fungal prophylaxis: Fluconazole 100 mg PO q7days x 1 month (06/18/2019)  HBV prophylaxis: Not necessary            At risk of infection transmitted from donor  05/21/2019     Patient underwent transplant with no known donor derived risk factors: Donor is not CDC high risk, not HBV core ab + and there are no known donor derived infections. No need for treatment or monitoring beyond the usual opportunistic infection prophylaxis per protocol. Donor with no known malignancies.  The donor's CMV status is IgG positive and the recipient's CMV status is IgG positive.  Prophylaxis will be determined by risk of CMV transmission.                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     Dialysis Type: HD Hemodialysis  Dialysis Center: [facility]         Phone: [phone]        Fax: [phone]       Address: 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  Been on HD since 2010.  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   Medication Sig    acetaminophen (TYLENOL) 500 mg tablet Take 2 tablets (1,000 mg total) by mouth every 8 (eight) hours as needed for Pain (mild pain) Max = 3000 mg/day    albuterol 90 mcg/actuation metered dose inhaler Inhale 1-2 puffs into the lungs every 6 (six) hours as needed for Wheezing or Shortness of Breath       amLODIPine (NORVASC) 10 mg tablet Take 1 tablet (10 mg total) by mouth daily For additional refills please contact your family doctor.    losartan (COZAAR) 25 mg tablet Take 2 tablets (50 mg total) by mouth daily     mycophenolate (CELLCEPT) 250 mg capsule Take 1 capsule (250 mg total) by mouth Twice  a day    omeprazole (PRILOSEC) 20 mg capsule Take 20 mg by mouth in the morning and at bedtime    predniSONE (DELTASONE) 5 mg tablet Take 1 tablet (5 mg total) by mouth daily .    senna (SENOKOT) 8.6 mg tablet Take 2 tablets (17.2 mg total) by mouth nightly as needed for Constipation    tacrolimus (PROGRAF) 1 mg capsule 12/20/20: Take 7 mg in AM and 7 mg in PM (1 mg and 5mg  ordered separately)             Objective      phone visit       Review of Prior Testing  I personally reviewed data as noted below including the following:    1. Metabolic panel  2. Complete blood count  3. Urine chemistry including level of proteinuria  4. Level of immunosuppressive drug (tacrolimus).          Lab Results   Component Value Date    Creatinine 1.51 (H) 12/24/2019    Creatinine 2.12 (H) 06/15/2019    Creatinine 2.52 (H) 06/09/2019    Creatinine, Serum / Plasma 1.99 (H) 12/15/2020    Creatinine, Serum / Plasma 1.71 (H) 12/01/2020    Creatinine, Serum / Plasma 1.81 (H) 11/10/2020    Urea Nitrogen, Serum / Plasma 22 12/15/2020    Urea Nitrogen, Serum / Plasma 22 12/01/2020    Urea Nitrogen, Serum / Plasma 25 11/10/2020    Sodium, Serum / Plasma 140 12/15/2020    Sodium, Serum / Plasma 142 12/01/2020    Sodium, Serum / Plasma 142 11/10/2020    Potassium, Serum / Plasma 3.3 (L) 12/15/2020    Potassium, Serum / Plasma 2.9 (L) 12/01/2020    Potassium, Serum / Plasma 3.1 (L) 11/10/2020    Carbon Dioxide, Total 21 12/15/2020    Carbon Dioxide, Total 25 12/01/2020    Carbon Dioxide, Total 27 11/10/2020    Calcium, total, Serum / Plasma 9.6 12/15/2020    Calcium, total, Serum / Plasma 9.9 12/01/2020    Calcium, total, Serum / Plasma 9.8 11/10/2020    Phosphorus, Serum / Plasma 3.3 12/15/2020    Phosphorus, Serum / Plasma 3.9 12/01/2020    Phosphorus, Serum / Plasma 3.7 11/10/2020     Lab Results   Component Value Date    WBC Count 3.8 12/15/2020    WBC  Count 3.7 (L) 12/01/2020    WBC Count 4.2 11/10/2020    Hemoglobin 16.0 12/15/2020    Hemoglobin 16.3 12/01/2020    Hemoglobin 15.9 11/10/2020    Hematocrit 48.8 12/15/2020    Hematocrit 50.5 (H) 12/01/2020    Hematocrit 49.1 11/10/2020    MCV 80.1 12/15/2020    MCV 81.8 12/01/2020    MCV 81.4 11/10/2020    Platelet Count 280 12/15/2020    Platelet Count 261 12/01/2020    Platelet Count 282 11/10/2020       Lab Results   Component Value Date    Tacrolimus 14.0 12/15/2020    Tacrolimus 10.3 12/01/2020    Tacrolimus 8.0 11/10/2020    Tacrolimus 5.9 06/15/2019    Tacrolimus 5.2 06/09/2019    Tacrolimus 5.0 06/04/2019          Assessment and Plan     Deceased-donor kidney transplant recipient  S/p DDRT 05/20/2019; 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  He will repeat labs next week.  AKI  in setting of high tacrolimus level of 14 ng/ml, dose has been reduced.    Immunosuppressive management encounter following kidney transplant  cPRA 10%.  Tacrolimus7mg in am and 7 mg in pmwith goal trough6-8ng/ml.  MMF 250 mg twice a day.  Continue prednisone5 mg daily.  Results of allograft biopsyon 7/8/2021showed no rejection, DSA neg.      HTN (hypertension)  BP controlled    Microhematuria:  CT imaging showing no renal lesions.  He was planned for cystoscopy which had to be rescheduled as patient was not able to come to [place].  [facility] was seen at [facility] in [place], will try to get consult note.    Post transplant erythrocytosis:  Losartan 50mg  daily.  CT showed no renal lesions.    Hypokalemia:K 2.9 now improved to 3.3  Continue potassium 40 meq. High potassium diet. Recheck K with next labs.    He has not received COVID vaccine , advised to get vaccinated.    Visit Diagnoses and Associated Orders Placed:  1. Deceased-donor kidney transplant recipient     2. Hypokalemia  potassium chloride (KDUR; KLOR-CON) 20  mEq tablet   3. Status post kidney transplant  potassium chloride (KDUR; KLOR-CON) 20 mEq tablet   4. Immunosuppressive management encounter following kidney transplant     5. Post-renal transplant erythrocytosis     6. Microhematuria     7. Legally blind     8. Primary hypertension        Orders Placed This Encounter    potassium chloride (KDUR; KLOR-CON) 20 mEq tablet     Sig: Take daily     Dispense:  40 tablet     Refill:  3     08/11/20: Z94.0, Txp 05/20/2019         Health Care Maintenance:   Recommend usual age- and sex-appropriate cancer screening plus annual dermatology visit for skin cancer surveillance   Recommend age and disease-appropriate vaccinations (killed vaccines only) including annual influenza vaccine. Live vaccines are contraindicated.     Risk Assessment: The patient is at increased risk of complications including drug toxicity, infection and malignancy as a result of immunosuppression for his kidney transplant and requires close monitoring of immunosuppression levels and dose adjustment to manage toxicities.   There is no evidence of immunosuppressive drug toxicity at this visit.                        The patient initiated this service. This service is in place of a face to face visit. The patient consents to conduct this visit by telephone because the tools necessary to participate in a video visit were not available at the time of this visit or the patient preferred a telephone visit. I spent a total of 24 minutes in communication with this patient.

## 2020-12-29 NOTE — Telephone Encounter (Signed)
Spoke with patient and rescheduled appointment.

## 2020-12-29 NOTE — Telephone Encounter (Signed)
Copied from CRM [id number]. Topic: URO - Appointment Request   >> Dec 29, 2020  1:07 PM Edison Nasuti wrote:  APPOINTMENT TEMPLATE    PATIENT NAME: Cody Myers  DATE OF BIRTH: 1976-04-07  MRN: [id number]    HOME PHONE NUMBER:     [phone]   ALTERNATE PHONE NUMBER:    n/a  LEAVING A MESSAGE OK?:    yes    REASON FOR APPOINTMENT:     cystoscopy    INSURANCE INFORMATION:  PAYOR: Medicare   PLAN: Part A&b Mc[employer]    MESSAGE / PROBLEM:    Pt needs to reschedule their cystoscopy      MESSAGE GENERATED BY [facility]  CRM NUMBER [phone] CREATED BY:    Idelia Salm, DANIEL,     12/29/2020,      1:07 PM

## 2020-12-31 LAB — MAGNESIUM, SERUM / PLASMA: Magnesium, Serum / Plasma: 2 mg/dL (ref 1.5–2.5)

## 2020-12-31 LAB — COMPLETE BLOOD COUNT WITH DIFFERENTIAL
% Basophils: 0.5 %
% Eosinophils: 1.3 %
% Lymphocytes: 29.4 %
% Monocytes: 14.7 %
% Neutrophils: 54.1 %
Abs Basophils (cells/uL): 20 {cells}/uL (ref 0–200)
Abs Eosinophils (cells/uL): 52 cells/uL (ref 15–500)
Abs Lymphocytes: 1176 cells/uL (ref 850–3900)
Abs Monocytes (cells/uL): 588 {cells}/uL (ref 200–950)
Abs Neutrophils (cells/uL): 2164 cells/uL (ref 1500–7800)
Hematocrit: 50 % (ref 38.5–50.0)
Hemoglobin: 16.5 g/dL (ref 13.2–17.1)
MCH: 26.7 pg — ABNORMAL LOW (ref 27.0–33.0)
MCHC: 33 g/dL (ref 32.0–36.0)
MCV: 80.9 fL (ref 80.0–100.0)
MPV: 10.7 fL (ref 7.5–12.5)
Platelet Count: 297 10*3/uL (ref 140–400)
RBC Count: 6.18 10*6/uL — ABNORMAL HIGH (ref 4.20–5.80)
RDW-CV: 13.9 % (ref 11.0–15.0)
WBC Count: 4 10*3/uL (ref 3.8–10.8)

## 2020-12-31 LAB — BASIC METABOLIC PANEL (NA, K, CL, CO2, BUN, CR, GLU, CA)
BUN/Creatinine Ratio: 13 (calc) (ref 6–22)
Calcium, total, Serum / Plasma: 10.1 mg/dL (ref 8.6–10.3)
Carbon Dioxide, Total: 21 mmol/L (ref 20–32)
Chloride, Serum / Plasma: 107 mmol/L (ref 98–110)
Creatinine, Serum / Plasma: 1.78 mg/dL — ABNORMAL HIGH (ref 0.60–1.29)
Glucose: 96 mg/dL (ref 65–99)
Potassium, Serum / Plasma: 3.5 mmol/L (ref 3.5–5.3)
Sodium, Serum / Plasma: 141 mmol/L (ref 135–146)
Urea Nitrogen, Serum / Plasma: 23 mg/dL (ref 7–25)
eGFRcr: 47 mL/min/{1.73_m2} — ABNORMAL LOW (ref >=60)

## 2020-12-31 LAB — TACROLIMUS, HIGHLY SENSITIVE, LC/MS/MS: Tacrolimus: 9.7 mcg/L

## 2020-12-31 LAB — PHOSPHORUS, SERUM / PLASMA: Phosphorus, Serum / Plasma: 3.8 mg/dL (ref 2.5–4.5)

## 2021-01-02 MED ORDER — TACROLIMUS 5 MG CAPSULE, IMMEDIATE-RELEASE
5 | ORAL_CAPSULE | ORAL | 11 refills | 30.00000 days | Status: DC
Start: 2021-01-02 — End: 2021-03-14

## 2021-01-02 MED ORDER — TACROLIMUS 1 MG CAPSULE, IMMEDIATE-RELEASE
1 mg | ORAL_CAPSULE | ORAL | 11 refills | Status: DC
Start: 2021-01-02 — End: 2021-03-14

## 2021-01-02 NOTE — Progress Notes (Signed)
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 Barnetta Chapel for review    Dr Zelphia Cairo plan-of-care:Reduce to 6/6   Med list updated     Patient agreed to receive plan-of-care by MyChart, which has been sent.

## 2021-01-03 NOTE — Telephone Encounter (Signed)
7/14    Resource Coordinator spoke c pt.    RC referred pt to the following two resources:    [employer] and [employer] shared a resource for support for air transportation for pt:    [employer] ([employer])    Phone Number:  [phone]  Hrs:  M-F from 8:30 am -4:30 pm    Email Address:  [email or web address]     Website:  [employer] - Free Flights for Those in Need    RC believes the pt should qualify for transportation from [employer] for all of his appts for medical procedures.    RC has encouraged pt to proceed to call today.    [facility] [place] ([place])    Phone Number:  [phone]    Email Address:  [email or web address]    Website:  [email or web address]    RCs experience is that no one has every picked up their phone line when [facility] has called.  Pt will need to wait for a callback from them.

## 2021-01-03 NOTE — Telephone Encounter (Signed)
07/16    Resource Coordinator followed up c pt.    Pt was told that flights for him and son to medical appt would be 1k.    RC encouraged pt to apply online and prove both financial need and presence of a medical appt, in order to secure a donated flight for both pt and his son.    RC spoke c son of pt, Amkah who has agreed to assist with the online application for pt.    Leonia Corona  [employer]

## 2021-01-17 ENCOUNTER — Ambulatory Visit: Admit: 2021-01-17 | Discharge: 2021-01-28 | Payer: MEDICARE | Attending: Physician | Primary: Physician

## 2021-01-17 DIAGNOSIS — E7800 Pure hypercholesterolemia, unspecified: Secondary | ICD-10-CM

## 2021-01-17 DIAGNOSIS — R319 Hematuria, unspecified: Secondary | ICD-10-CM

## 2021-01-17 DIAGNOSIS — J45909 Unspecified asthma, uncomplicated: Secondary | ICD-10-CM

## 2021-01-17 DIAGNOSIS — Z79899 Other long term (current) drug therapy: Secondary | ICD-10-CM

## 2021-01-17 DIAGNOSIS — G4730 Sleep apnea, unspecified: Secondary | ICD-10-CM

## 2021-01-17 DIAGNOSIS — N186 End stage renal disease: Secondary | ICD-10-CM

## 2021-01-17 DIAGNOSIS — Z87891 Personal history of nicotine dependence: Secondary | ICD-10-CM

## 2021-01-17 DIAGNOSIS — H548 Legal blindness, as defined in USA: Secondary | ICD-10-CM

## 2021-01-17 DIAGNOSIS — R3129 Other microscopic hematuria: Secondary | ICD-10-CM

## 2021-01-17 DIAGNOSIS — I120 Hypertensive chronic kidney disease with stage 5 chronic kidney disease or end stage renal disease: Secondary | ICD-10-CM

## 2021-01-17 MED ORDER — LIDOCAINE 2 % MUCOSAL JELLY IN APPLICATOR
2 | Status: AC
Start: 2021-01-17 — End: ?

## 2021-01-17 MED ORDER — LEVOFLOXACIN 500 MG TABLET
500 | Freq: Once | ORAL | Status: AC
Start: 2021-01-17 — End: 2021-01-17
  Administered 2021-01-17: 19:00:00 via ORAL

## 2021-01-17 MED ORDER — SULFAMETHOXAZOLE 800 MG-TRIMETHOPRIM 160 MG TABLET
800-160 | Freq: Once | ORAL | Status: DC
Start: 2021-01-17 — End: 2021-01-17

## 2021-01-17 MED ORDER — SODIUM CHLORIDE 0.9 % BLADDER IRRIGATION SOLUTION
0.9 | Freq: Once | Status: AC
Start: 2021-01-17 — End: 2021-01-17

## 2021-01-17 MED ORDER — LIDOCAINE 2 % MUCOSAL JELLY IN APPLICATOR
2 | Freq: Once | Status: AC
Start: 2021-01-17 — End: 2021-01-17
  Administered 2021-01-17: 19:00:00 via URETHRAL

## 2021-01-17 MED ORDER — NITROFURANTOIN MONOHYDRATE/MACROCRYSTALS 100 MG CAPSULE
100 | Freq: Once | ORAL | Status: DC
Start: 2021-01-17 — End: 2021-01-17

## 2021-01-17 MED FILL — LEVOFLOXACIN 500 MG TABLET: 500 mg | ORAL | Qty: 1

## 2021-01-17 MED FILL — LIDOCAINE 2 % MUCOSAL JELLY IN APPLICATOR: 2 % | Qty: 10

## 2021-01-17 NOTE — Progress Notes (Signed)
lPreoperative Diagnosis: Hematuria, unspecified type     Postoperative Diagnosis: Same    Procedure/Operation Performed: Cystoscopy    Attending Surgeon: Allena Earing, MD    Anesthesia: Xylocaine jelly    Pre-Medication: Antibiotic prophylaxis with Nitrofurantoin    Preparation: None    Indications for Procedure: This 45 y.o. man presents with the diagnosis / diagnoses listed above. The procedure was described in detail to the patient.  The risks, benefits, and alternatives were thoroughly discussed. The patient wished to proceed with the recommended procedure.  Informed consent was obtained and is documented in the chart.    Time Out: A time-out was completed verifying correct patient, procedure, site, positioning, and implant(s) and/or special equipment prior to beginning this procedure.    Procedure and Findings: The patient was prepped and draped in the usual manner in the lithotomy position. External genitalia was prepped and draped in the usual sterile fashion.  2% lidocaine gel was injected per urethra for local anesthesia.  A well lubricated flexible cystoscope was carefully passed through the urethra and into the bladder under direct vision.    The urethra was normal  The bladder mucosa was normal without trabeculations, lesions, or stones  The ureteral orifices were unable to be visualized due to high bladder neck   Prostate: 3cm in length, non-obstructing  Additional findings: high/friable bladder neck  The cystoscopy was carefully withdrawn and the above findings confirmed.    Estimated Blood Loss: None    Complications: None    Impression: normal with high friable bladder neck; likely source of bleeding

## 2021-01-17 NOTE — Progress Notes (Signed)
History of Present Illness: Cody Myers is a 45 y/o, legally-blind transplant patient presenting for work up for microhematuria.     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 He has a history of microscopic hematuria dating back to July 2021. Of note he had a kidney biopsy at the time, but hematuria did not resolve a month later.     He is legally blind and unable to state whether he has seen any gross hematuria.     Denies any history of UTI. He had gonorrhea 4 years ago, but was treated.     He has a history tobacco use; patient reports approximately 17 pack years of tobacco use, quit 11 years.  Denies history of other exposures to toxins, pesticides, chemicals, heavy metals. Denies history of GU Cancers.    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 Cystoscopy was delayed due to patient difficulty with transportation.    The patient's past medical history is notable for:  Past Medical History:   Diagnosis Date    Asthma     Inhalers    Back pain     Blood transfusion without reported diagnosis     BMI 35.0-35.9,adult     Height 6 foot even - weight 260 pounds for BMI 36.      Chronic kidney disease 2009    ESRD due FSGS / DM.    Need to clarify FSGS by biopsy.  Noted in evaluation with [facility] but no further documentation of FSGS by Nephrology.     Depression     not taking medication    GERD (gastroesophageal reflux disease)     Gout     Heart murmur     Hypercholesterolemia     Hypertension     2018 BP dropping on dialysis - MD ordered Midodrine 5 mg for SBD <100    Legally blind 2010    Conjunctiva disorder with prolapse right eye.  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    MRSA (methicillin resistant Staphylococcus aureus)     Sleep apnea     Thromboembolism (CMS code)     Dialysis graft  only        The patient's past surgical history is notable for:  Past Surgical History:   Procedure Laterality Date    AV FISTULA PLACEMENT Right 2016    BACK SURGERY      Bilateral Eye Surgery      CENTRAL VENOUS CATHETER  2015    Place and removed once fistula working    COLONOSCOPY  03/2018    Negative for polyps - no specimens taken    DIALYSIS FISTULA CREATION Left 2010    Revision 2014    IR DIALYSIS FISTULAGRAM (ORDERABLE BY IR SERVICE ONLY)  05/22/2019    IR DIALYSIS FISTULAGRAM (ORDERABLE BY IR SERVICE ONLY) 05/22/2019 Jenita Seashore, MD RAD IR [facility]ARN    TOOTH EXTRACTION  2018    Wound Vac  2015    Dehiscence anterior torso - chest       The patient is currently taking the following medications:  Current Outpatient Medications   Medication Sig Dispense Refill    acetaminophen (TYLENOL) 500 mg tablet Take 2 tablets (1,000 mg  total) by mouth every 8 (eight) hours as needed for Pain (mild pain) Max = 3000 mg/day 50 tablet 0    albuterol 90 mcg/actuation metered dose inhaler Inhale 1-2 puffs into the lungs every 6 (six) hours as needed for Wheezing or Shortness of Breath         amLODIPine (NORVASC) 10 mg tablet Take 1 tablet (10 mg total) by mouth daily For additional refills please contact your family doctor. 90 tablet 0    docusate sodium (COLACE) 250 mg capsule Take 1 capsule (250 mg total) by mouth Twice a day Hold for loose stools 100 capsule 0    losartan (COZAAR) 25 mg tablet Take 2 tablets (50 mg total) by mouth daily 30 tablet 11    mycophenolate (CELLCEPT) 250 mg capsule Take 1 capsule (250 mg total) by mouth Twice a day 60 capsule 11    omeprazole (PRILOSEC) 20 mg capsule Take 20 mg by mouth in the morning and at bedtime      oxyCODONE (ROXICODONE) 5 mg tablet Take 1 tablet (5 mg total) by mouth every 6 (six) hours as needed (severe pain) (Patient taking differently: Take 10 mg by mouth every 6 (six) hours as needed (severe pain)) 8 tablet 0    oxyCODONE-acetaminophen (PERCOCET)  10-325 mg tablet Take 1 tablet by mouth every 8 (eight) hours as needed for Pain (Lower back pain)         potassium chloride (KDUR; KLOR-CON) 20 mEq tablet Take daily 40 tablet 3    predniSONE (DELTASONE) 5 mg tablet Take 1 tablet (5 mg total) by mouth daily . 30 tablet 11    senna (SENOKOT) 8.6 mg tablet Take 2 tablets (17.2 mg total) by mouth nightly as needed for Constipation 100 tablet 0    tacrolimus (PROGRAF) 1 mg capsule 01/02/21: Take 6 mg in AM and 6 mg in PM (1 mg and 5mg  ordered separately) 60 capsule 11    tacrolimus (PROGRAF) 5 mg capsule 01/02/21: Take 6 mg in AM and 6 mg in PM (1 mg and 5mg  ordered separately) 60 capsule 11     Current Facility-Administered Medications   Medication Dose Route Frequency Provider Last Rate Last Admin    0.9% sodium chloride bladder irrigation solution  0-3,000 mL Irrigation Once 01/04/21, MD        0.9% sodium chloride bladder irrigation solution  0-3,000 mL Irrigation Once , MD        lidocaine (XYLOCAINE URO-JET) 2 % jelly 10 mL  10 mL Intra-urethral Once Gerrianne Scale, MD        lidocaine (XYLOCAINE URO-JET) 2 % jelly 10 mL  10 mL Intra-urethral Once Gerrianne Scale, MD        nitrofurantoin (macrocrystal-monohydrate) (MACROBID) 100 mg capsule 100 mg  100 mg Oral Once Gerrianne Scale, MD        sulfamethoxazole-trimethoprim (BACTRIM DS,SEPTRA DS) 800-160 mg tablet 1 tablet  1 tablet Oral Once Gerrianne Scale, MD           The patient is allergic to:     Allergies/Contraindications   Allergen Reactions    Aspirin Other (See Comments)     Bleeding ulcers; Has stomach bleeding         The patient's past family  history is notable for:  Family History   Problem Relation Name Age of Onset    Diabetes Mother      Heart disease Mother  Hypertension Mother      Diabetes Father      Heart disease Father      Hypertension Father          The patient's past social  history is notable for:  Social  History     Socioeconomic History    Marital status: Single     Spouse name: Not on file    Number of children: 4    Years of education: 12    Highest education level: Associate degree: occupational, Scientist, product/process development, or vocational program   Occupational History    Occupation: Disabled Naval architect   Tobacco Use    Smoking status: Former Smoker     Packs/day: 1.00     Years: 4.00     Pack years: 4.00     Types: Cigarettes     Quit date: 06/19/2007     Years since quitting: 13.5    Smokeless tobacco: Never Used   Substance and Sexual Activity    Alcohol use: Not Currently    Drug use: Not Currently    Sexual activity: Not Currently   Other Topics Concern    Not on file   Social History Narrative    Not on file     Social Determinants of Health     Financial Resource Strain: Not on file   Food Insecurity: Not on file   Transportation Needs: Not on file        Review of systems was performed and reviewed today.        During the physical examination today, the patients vital signs were  BP 133/75   Pulse 83   Temp 36.2 C (97.1 F)   Ht 182.9 cm (6')   Wt (!) 124.7 kg (275 lb)   SpO2 97%   BMI 37.30 kg/m      General:  NAD, A&O x 3  Resp: Breathing comfortably on room air  GU: Circumcised phallus. No visible lesions   Ext: WWP. No edema.       The following lab results were personally reviewed by me:    Objective:      Lab Review  Lab Results   Component Value Date    WBC Count 4.0 12/29/2020    Hemoglobin 16.5 12/29/2020    Hematocrit 50.0 12/29/2020    MCV 80.9 12/29/2020    Platelet Count 297 12/29/2020      Lab Results   Component Value Date    Calcium, total, Serum / Plasma 10.1 12/29/2020      Lab Results   Component Value Date    Sodium, Serum / Plasma 141 12/29/2020    Potassium, Serum / Plasma 3.5 12/29/2020    Chloride, Serum / Plasma 107 12/29/2020    Carbon Dioxide, Total 21 12/29/2020      Lab Results   Component Value Date    Creatinine 1.51 (H) 12/24/2019    Creatinine, Serum / Plasma 1.78 (H)  12/29/2020      No results found for: POCTLEUK, POCTNITRITE, POCTPROTEIN, POCTPHUR, POCTRBCUR, POCTSPECGRAV, POCTKETONESU, POCTBILIUR, POCTGLUCUR    Results for orders placed or performed during the hospital encounter of 05/20/19   Urine Culture    Specimen: Urine, Midstream    URINE FROM BLADDER   Result Value Ref Range    Comments       Collected during surgical procedure.  CALL: [id number]      Bacterial Culture, Urine w/o gram stain No growth 2 days.  No results found for this or any previous visit.  Hemoglobin A1c (% of total Hgb)   Date Value   11/10/2019 5.2       No results found for: PSA, PSAS, PSAEXT, PSATOT, PSAU        Imaging: CT ABDOMEN/PELVIS WITH CONTRAST  12/24/2019 5:23 PM    CLINICAL HISTORY: abd pain after transplant kidney biopsy with free fluid around transplant on US    COMPARISON:  Ultrasound 12/24/2019 and CT 03/12/2019    TECHNIQUE: CT of the abdomen and pelvis was performed.     CONTRAST MEDIA: Intravenous iohexol    FINDINGS:    Visualized lung bases:  Unremarkable    Liver:  Unremarkable    Gallbladder: Unremarkable    Spleen:  Unremarkable    Pancreas:  Unremarkable     Adrenal Glands:  Unremarkable    Kidneys:  Atrophic bilateral native kidneys with bilateral hypodensities too small to definitively characterize. Minimal fluid about the right lower quadrant renal transplant. No hydronephrosis.    GI Tract:  Unremarkable    Vasculature:  Mild nonspecific fat stranding around right iliac vasculature.    Lymphadenopathy: Absent    Peritoneum: Hyperdense products at the inferior margin of the liver, measuring 2.4 x 1.4 x 2.4 cm, tracks inferiorly adjacent to the transplant kidney (Se/Im 3/90; 602/46). It is difficult to distinguish between bowel and blood in this region due to phase of contrast.    Bladder: Unremarkable    Reproductive organs: Unremarkable    Bones:  No suspicious lesions. 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    Extraperitoneal soft tissues:  Unremarkable    Lines/drains/medical devices: None    RADIATION DOSE INDICATORS:  Exposure Events: 3 , CTDIvol Min: 29.8 mGy, CTDIvol Max: 29.8 mGy, DLP: 1601.5 mGy.cm    IMPRESSION:     Questionable small hematoma at the inferior right liver margin. Less likely consideration would include a loop of small bowel.    The final report above differs from the preliminary interpretation issued at the time of the examination, which did not include questionable small right lower quadrant hematoma. This change was called to Yolonda KidaKathryn Walker NP by Dr. Lou MinerSarah E Nobles, MD at [phone]  hours on 12/25/2019      Assessment and Plan: It was a pleasure seeing Cody Myers in clinic today.     I addressed microhematuria with the patient today.    Cystoscopy today revealed no suspicious lesions or masses.    We discussed repeat ultrasound given that CT was from almost a year ago to ensure no new masses or kidney stones. This was again ordered.     If ultrasound negative recommend repeat UA in one year.     .Marland Kitchen

## 2021-01-17 NOTE — Patient Instructions (Addendum)
Cystoscopy: What to Expect at [place]     A cystoscopy is a procedure that lets a doctor look inside of the bladder and the urethra. The urethra is the tube that carries urine from the bladder to outside the body. The doctor uses a thin, lighted tool called a cystoscope. Your bladder is filled with fluid. This stretches the bladder so that your doctor can look closely at the inside of your bladder.  After the cystoscopy, your urethra may be sore at first, and it may burn when you urinate for the first few days after the procedure. You may feel the need to urinate more often, and your urine may be pink. These symptoms should get better in 1 or 2 days. You will probably be able to go back to most of your usual activities in 1 or 2 days.  This care sheet gives you a general idea about how long it will take for you to recover. But each person recovers at a different pace. Follow the steps below to get better as quickly as possible.  How can you care for yourself at home?  Activity    Rest when you feel tired. Getting enough sleep will help you recover.     Try to walk each day. Start by walking a little more than you did the day before. Bit by bit, increase the amount you walk. Walking boosts blood flow and helps prevent pneumonia and constipation.     Avoid strenuous activities, such as bicycle riding, jogging, weight lifting, or aerobic exercise, until your doctor says it is okay.     Ask your doctor when you can drive again.     Most people are able to return to work within 1 or 2 days after the procedure.     You may shower and take baths as usual.     Ask your doctor when it is okay for you to have sex.   Diet    You can eat your normal diet. If your stomach is upset, try bland, low-fat foods like plain rice, broiled chicken, toast, and yogurt.     Drink plenty of fluids (unless your doctor tells you not to).   Medicines    Take pain medicines exactly as directed.  If the doctor gave you a prescription  medicine for pain, take it as prescribed.  If you are not taking a prescription pain medicine, ask your doctor if you can take an over-the-counter medicine.     If you think your pain medicine is making you sick to your stomach:  Take your medicine after meals (unless your doctor has told you not to).  Ask your doctor for a different pain medicine.     If your doctor prescribed antibiotics, take them as directed. Do not stop taking them just because you feel better. You need to take the full course of antibiotics.   Follow-up care is a key part of your treatment and safety. Be sure to make and go to all appointments, and call your doctor if you are having problems. It's also a good idea to know your test results and keep a list of the medicines you take.  When should you call for help?   Call 911 anytime you think you may need emergency care. For example, call if:    You passed out (lost consciousness).     You have severe trouble breathing.     You have sudden chest pain and shortness  of breath, or you cough up blood.     You have severe belly pain.   Call your doctor now or seek immediate medical care if:    You are sick to your stomach or cannot keep fluids down.     Your urine is still red or you see blood clots after you have urinated several times.     You have trouble passing urine or stool, especially if you have pain or swelling in your lower belly.     You have signs of a blood clot, such as:  Pain in your calf, back of the knee, thigh, or groin.  Redness and swelling in your leg or groin.     You develop a fever or severe chills.     You have pain in your back just below your rib cage. This is called flank pain.   Watch closely for changes in your health, and be sure to contact your doctor if:    You have pain or burning when you urinate. A burning feeling is normal for a day or two after the test, but call if it does not get better.     You have a frequent urge to urinate but can pass only small amounts  of urine.     Your urine is pink, red, or cloudy, or smells bad. It is normal for the urine to have a pinkish color for a few days after the test, but call if it does not get better.   Where can you learn more?  Go to [employer]  Enter [phone] in the search box to learn more about "Cystoscopy: What to Expect at Home."  Current as of: April 04, 2020               Content Version: 13.3   2006-2022 [employer].   Care instructions adapted under license by your healthcare professional. If you have questions about a medical condition or this instruction, always ask your healthcare professional. [employer] disclaims any warranty or liability for your use of this information.      Please call [facility] operator to ask for Radiology outpatient scheduling to schedule your ultrasound

## 2021-01-28 LAB — COMPLETE BLOOD COUNT WITH DIFFERENTIAL
% Basophils: 0.7 %
% Eosinophils: 2.2 %
% Lymphocytes: 26.9 %
% Monocytes: 13.8 %
% Neutrophils: 56.4 %
Abs Basophils (cells/uL): 29 cells/uL (ref 0–200)
Abs Eosinophils (cells/uL): 90 cells/uL (ref 15–500)
Abs Lymphocytes: 1103 cells/uL (ref 850–3900)
Abs Monocytes (cells/uL): 566 cells/uL (ref 200–950)
Abs Neutrophils (cells/uL): 2312 cells/uL (ref 1500–7800)
Hematocrit: 49.4 % (ref 38.5–50.0)
Hemoglobin: 16.2 g/dL (ref 13.2–17.1)
MCH: 26.5 pg — ABNORMAL LOW (ref 27.0–33.0)
MCHC: 32.8 g/dL (ref 32.0–36.0)
MCV: 80.9 fL (ref 80.0–100.0)
MPV: 11.1 fL (ref 7.5–12.5)
Platelet Count: 287 10*3/uL (ref 140–400)
RBC Count: 6.11 10*6/uL — ABNORMAL HIGH (ref 4.20–5.80)
RDW-CV: 14.3 % (ref 11.0–15.0)
WBC Count: 4.1 10*3/uL (ref 3.8–10.8)

## 2021-01-28 LAB — BASIC METABOLIC PANEL (NA, K, CL, CO2, BUN, CR, GLU, CA)
BUN/Creatinine Ratio: 13 (calc) (ref 6–22)
Calcium, total, Serum / Plasma: 10.1 mg/dL (ref 8.6–10.3)
Carbon Dioxide, Total: 26 mmol/L (ref 20–32)
Chloride, Serum / Plasma: 103 mmol/L (ref 98–110)
Creatinine, Serum / Plasma: 1.54 mg/dL — ABNORMAL HIGH (ref 0.60–1.29)
Glucose: 101 mg/dL — ABNORMAL HIGH (ref 65–99)
Potassium, Serum / Plasma: 3.4 mmol/L — ABNORMAL LOW (ref 3.5–5.3)
Sodium, Serum / Plasma: 140 mmol/L (ref 135–146)
Urea Nitrogen, Serum / Plasma: 20 mg/dL (ref 7–25)
eGFRcr: 56 mL/min/{1.73_m2} — ABNORMAL LOW (ref >=60)

## 2021-01-28 LAB — MAGNESIUM, SERUM / PLASMA: Magnesium, Serum / Plasma: 1.8 mg/dL (ref 1.5–2.5)

## 2021-01-28 LAB — PHOSPHORUS, SERUM / PLASMA: Phosphorus, Serum / Plasma: 3.5 mg/dL (ref 2.5–4.5)

## 2021-01-28 LAB — TACROLIMUS, HIGHLY SENSITIVE, LC/MS/MS: Tacrolimus: 6.8 ug/L

## 2021-01-31 NOTE — Telephone Encounter (Signed)
Follow up:     Patient was not able to do add-on labs last lab visit. RN reminded patient to do add on on next lab appt.     Patient verbalized understanding of plan-of-care

## 2021-02-17 LAB — TITUS CONVERTED DATA - EXTERNAL: Allosure Test - External: 0.35 %

## 2021-02-18 LAB — BASIC METABOLIC PANEL (NA, K, CL, CO2, BUN, CR, GLU, CA)
BUN/Creatinine Ratio: 12 (calc) (ref 6–22)
Calcium, total, Serum / Plasma: 10 mg/dL (ref 8.6–10.3)
Carbon Dioxide, Total: 22 mmol/L (ref 20–32)
Chloride, Serum / Plasma: 106 mmol/L (ref 98–110)
Creatinine, Serum / Plasma: 1.69 mg/dL — ABNORMAL HIGH (ref 0.60–1.29)
Glucose: 92 mg/dL (ref 65–139)
Potassium, Serum / Plasma: 3.8 mmol/L (ref 3.5–5.3)
Sodium, Serum / Plasma: 140 mmol/L (ref 135–146)
Urea Nitrogen, Serum / Plasma: 20 mg/dL (ref 7–25)
eGFRcr: 50 mL/min/{1.73_m2} — ABNORMAL LOW (ref >=60)

## 2021-02-18 LAB — COMPLETE BLOOD COUNT WITH DIFFERENTIAL
% Basophils: 0.8 %
% Eosinophils: 0.8 %
% Lymphocytes: 26.3 %
% Monocytes: 10.7 %
% Neutrophils: 61.4 %
Abs Basophils (cells/uL): 39 {cells}/uL (ref 0–200)
Abs Eosinophils (cells/uL): 39 cells/uL (ref 15–500)
Abs Lymphocytes: 1289 {cells}/uL (ref 850–3900)
Abs Monocytes (cells/uL): 524 cells/uL (ref 200–950)
Abs Neutrophils (cells/uL): 3009 cells/uL (ref 1500–7800)
Hematocrit: 51.2 % — ABNORMAL HIGH (ref 38.5–50.0)
Hemoglobin: 16.5 g/dL (ref 13.2–17.1)
MCH: 26.7 pg — ABNORMAL LOW (ref 27.0–33.0)
MCHC: 32.2 g/dL (ref 32.0–36.0)
MCV: 82.7 fL (ref 80.0–100.0)
MPV: 10.8 fL (ref 7.5–12.5)
Platelet Count: 240 10*3/uL (ref 140–400)
RBC Count: 6.19 10*6/uL — ABNORMAL HIGH (ref 4.20–5.80)
RDW-CV: 14.7 % (ref 11.0–15.0)
WBC Count: 4.9 10*3/uL (ref 3.8–10.8)

## 2021-02-18 LAB — PHOSPHORUS, SERUM / PLASMA: Phosphorus, Serum / Plasma: 3.4 mg/dL (ref 2.5–4.5)

## 2021-02-18 LAB — MAGNESIUM, SERUM / PLASMA: Magnesium, Serum / Plasma: 1.7 mg/dL (ref 1.5–2.5)

## 2021-02-18 LAB — TACROLIMUS, HIGHLY SENSITIVE, LC/MS/MS: Tacrolimus: 5.5 mcg/L

## 2021-03-11 LAB — COMPLETE BLOOD COUNT WITH DIFFERENTIAL
% Basophils: 0.7 %
% Eosinophils: 1.4 %
% Lymphocytes: 32.7 %
% Monocytes: 14.1 %
% Neutrophils: 51.1 %
Abs Basophils (cells/uL): 31 cells/uL (ref 0–200)
Abs Eosinophils (cells/uL): 62 cells/uL (ref 15–500)
Abs Lymphocytes: 1439 cells/uL (ref 850–3900)
Abs Monocytes (cells/uL): 620 cells/uL (ref 200–950)
Abs Neutrophils (cells/uL): 2248 cells/uL (ref 1500–7800)
Hematocrit: 47.7 % (ref 38.5–50.0)
Hemoglobin: 16 g/dL (ref 13.2–17.1)
MCH: 27.9 pg (ref 27.0–33.0)
MCHC: 33.5 g/dL (ref 32.0–36.0)
MCV: 83.1 fL (ref 80.0–100.0)
MPV: 11.8 fL (ref 7.5–12.5)
Platelet Count: 309 10*3/uL (ref 140–400)
RBC Count: 5.74 10*6/uL (ref 4.20–5.80)
RDW-CV: 14.2 % (ref 11.0–15.0)
WBC Count: 4.4 10*3/uL (ref 3.8–10.8)

## 2021-03-11 LAB — TACROLIMUS, HIGHLY SENSITIVE, LC/MS/MS: Tacrolimus: 9.2 ug/L

## 2021-03-11 LAB — BASIC METABOLIC PANEL (NA, K, CL, CO2, BUN, CR, GLU, CA)
BUN/Creatinine Ratio: 10 (calc) (ref 6–22)
Calcium, total, Serum / Plasma: 10 mg/dL (ref 8.6–10.3)
Carbon Dioxide, Total: 21 mmol/L (ref 20–32)
Chloride, Serum / Plasma: 104 mmol/L (ref 98–110)
Creatinine, Serum / Plasma: 1.74 mg/dL — ABNORMAL HIGH (ref 0.60–1.29)
Glucose: 74 mg/dL (ref 65–139)
Potassium, Serum / Plasma: 3.4 mmol/L — ABNORMAL LOW (ref 3.5–5.3)
Sodium, Serum / Plasma: 141 mmol/L (ref 135–146)
Urea Nitrogen, Serum / Plasma: 17 mg/dL (ref 7–25)
eGFRcr: 49 mL/min/{1.73_m2} — ABNORMAL LOW (ref >=60)

## 2021-03-11 LAB — PHOSPHORUS, SERUM / PLASMA: Phosphorus, Serum / Plasma: 3.4 mg/dL (ref 2.5–4.5)

## 2021-03-11 LAB — MAGNESIUM, SERUM / PLASMA: Magnesium, Serum / Plasma: 1.6 mg/dL (ref 1.5–2.5)

## 2021-03-13 NOTE — Telephone Encounter (Signed)
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   CPRA: 10%      Next KTU appt: 03/30/2021 Cody Carne, MD    IS Meds:  Tac 6/6 goal6-8 MMF 250/250 (low WBC) Pred 5     Creatinine: 1.74, 1.69, 1.54 baseline 1.5-1.7    Issue(s): Tac 9.2 drawn at 10am     Takes meds 10a/1030p    Dose/trough verified  Prev levels: 5.5, 6.8  Last dose change: 01/02/21 Decr 7/7 to 6/6 for level 9.7  New medication including herbals: Maxitrol for eye infection, azithromycin for cough/mucus   Illnesses: denies  Hydration: 2L/day     RN action:  sent to Dr.Brar for review    Dr Daryll Drown plan-of-care: Reduce tac to 5 mg in am and 6 mg in pm   Increase MMF to 500 mg bid

## 2021-03-14 MED ORDER — MYCOPHENOLATE MOFETIL 250 MG CAPSULE
250 | ORAL_CAPSULE | ORAL | 11 refills | Status: DC
Start: 2021-03-14 — End: 2022-01-23

## 2021-03-14 MED ORDER — TACROLIMUS 1 MG CAPSULE, IMMEDIATE-RELEASE
1 mg | ORAL_CAPSULE | ORAL | 11 refills | Status: AC
Start: 2021-03-14 — End: 2021-10-25

## 2021-03-14 MED ORDER — TACROLIMUS 5 MG CAPSULE, IMMEDIATE-RELEASE
5 | ORAL_CAPSULE | ORAL | 11 refills | 30.00000 days | Status: DC
Start: 2021-03-14 — End: 2021-10-25

## 2021-03-28 NOTE — Telephone Encounter (Signed)
Additional refills with pcp mrnel

## 2021-03-30 ENCOUNTER — Ambulatory Visit: Admit: 2021-03-31 | Discharge: 2021-04-13 | Payer: MEDICARE | Attending: Nephrology | Primary: Physician

## 2021-03-30 DIAGNOSIS — E876 Hypokalemia: Secondary | ICD-10-CM

## 2021-03-30 DIAGNOSIS — Z94 Kidney transplant status: Secondary

## 2021-03-30 MED ORDER — POTASSIUM CHLORIDE ER 20 MEQ TABLET,EXTENDED RELEASE(PART/CRYST)
20 | ORAL_TABLET | ORAL | 3 refills | Status: DC
Start: 2021-03-30 — End: 2021-10-25

## 2021-03-30 MED ORDER — PREDNISONE 5 MG TABLET
5 | ORAL_TABLET | Freq: Every day | ORAL | 11 refills | 30.00000 days | Status: DC
Start: 2021-03-30 — End: 2021-07-11

## 2021-03-30 NOTE — Progress Notes (Signed)
POST-KIDNEY TRANSPLANT NEPHROLOGY PROGRESS NOTE         Subjective      Chief Complaint   Patient presents with    Kidney Transplant Follow-up       History of Present Illness:  [name] is a 45 y.o. year old male S/P kidney transplant on 05/20/2019 being seen for a follow-up visit.   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 Acute tubular epithelia injury; see comment.   2. No evidence of rejection, negative C4d stain.   2. No significant tubulointerstitial chronicity.   3. Moderate arteriosclerosis, donor-derived.     Special Stain Summary: (x8) PAS and trichrome special stains were  performed and examined to confirm the diagnosis. The findings are  consistent with our H&E-based light microscopic impression. Six  additional PAS-stained level sections were evaluated, and no segmentally  sclerotic glomeruli are identified.     DSAs not done      Immunosuppressive management encounter following kidney transplant 05/21/2019     Induction agent/dose: Thymo lite  Regime: Tac/MMF  Corticosteroid plan: maintenance   cPRA:  10%      Transplant follow-up 05/21/2019     Category Problem Type of follow-up needed in clinic? Ordered? Scheduled? Comments   Nursing (e.g., drain/ Foley) HD tunneled line refer to IR for removal when appropriate Needs to be done in clinic weekly dressing changes in HD clinic until can be removed   Lab/ Micro Standard Tac Follow up results   Needs to be ordered None   Imaging studies/ procedures NA NA NA None   Referrals Uroglogy for stent removal NA Already ordered None   Delayed graft function and improving UOP, monitor for resolution        Ureteral stent retained of kidney transplant (CMS code) 05/21/2019     Implant Name Type Inv. Item Serial No. Manufacturer Lot No. LRB No. Used Action   STENT URETHRAL 6FR X 16CM DOUBLE J   [id number] - [id number] Stents-Non Des STENT URETHRAL 6FR X 16CM DOUBLE J   [id number]   [id number] Right 1 Implanted     Referral placed for outpatient stent removal    07/01/19 stent removed.       At risk for opportunistic infections 05/21/2019     Transplant Serology Workup:  Recipient        Lab Results   Component Value Date    HBCAB NEG 12/17/2018    HBSAG NEG 12/17/2018    HBABQ >800 12/17/2018    HCV NEG 12/17/2018  RPR Nonreactive 12/17/2018    CMVAB POS (A) 12/17/2018    VCAM NEG 12/17/2018    EBNA POS (A) 12/17/2018    TOXO NEG 12/17/2018         Donor        Cadaver Donor UNOS ID (no units)   Date Value   05/20/2019 [id number]     Match ID: [id number]          Induction Plan:   Thymo + steroid maintenance     Plan for PPX:   Toxo prophylaxis: (D +/R - Toxo MISMATCH), Septra DS 1 tab PO Daily x 1 month (06/19/2019), then qMWF x 11 months more (05/19/2020) -- 1 year total for Toxo Mismatch   PCP prophylaxis: Septra DS as per above for Toxo ppx   CMV prophylaxis: (D +/R +), Valcyte 900 mg PO Daily (adjusted for renal function) x 3 months (08/18/2019)   Fungal prophylaxis: Fluconazole 100 mg PO q7days x 1 month (06/18/2019)  HBV prophylaxis: Not necessary            At risk of  infection transmitted from donor 05/21/2019     Patient underwent transplant with no known donor derived risk factors: Donor is not CDC high risk, not HBV core ab + and there are no known donor derived infections. No need for treatment or monitoring beyond the usual opportunistic infection prophylaxis per protocol. Donor with no known malignancies.  The donor's CMV status is IgG positive and the recipient's CMV status is IgG positive.  Prophylaxis will be determined by risk of CMV transmission.                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     Dialysis Type: HD Hemodialysis  Dialysis Center: [facility] / [facility]         Phone: [phone]        Fax: [phone]       Address: 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  Been on HD since 2010.  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                  Objective        .   Phone visit .       Review of Prior Testing  I personally reviewed data as noted below including the following:    1. Metabolic panel  2. Complete blood count  3. Urine chemistry including level of proteinuria  4. Level of immunosuppressive drug (tacrolimus).          Lab Results   Component Value Date    Creatinine 1.51 (H) 12/24/2019    Creatinine 2.12 (H) 06/15/2019    Creatinine 2.52 (H) 06/09/2019    Creatinine, Serum / Plasma 1.74 (H) 03/09/2021    Creatinine, Serum / Plasma 1.69 (H)  02/16/2021    Creatinine, Serum / Plasma 1.54 (H) 01/26/2021    Urea  Nitrogen, Serum / Plasma 17 03/09/2021    Urea Nitrogen, Serum / Plasma 20 02/16/2021    Urea Nitrogen, Serum / Plasma 20 01/26/2021    Sodium, Serum / Plasma 141 03/09/2021    Sodium, Serum / Plasma 140 02/16/2021    Sodium, Serum / Plasma 140 01/26/2021    Potassium, Serum / Plasma 3.4 (L) 03/09/2021    Potassium, Serum / Plasma 3.8 02/16/2021    Potassium, Serum / Plasma 3.4 (L) 01/26/2021    Carbon Dioxide, Total 21 03/09/2021    Carbon Dioxide, Total 22 02/16/2021    Carbon Dioxide, Total 26 01/26/2021    Calcium, total, Serum / Plasma 10.0 03/09/2021    Calcium, total, Serum / Plasma 10.0 02/16/2021    Calcium, total, Serum / Plasma 10.1 01/26/2021    Phosphorus, Serum / Plasma 3.4 03/09/2021    Phosphorus, Serum / Plasma 3.4 02/16/2021    Phosphorus, Serum / Plasma 3.5 01/26/2021     Lab Results   Component Value Date    WBC Count 4.4 03/09/2021    WBC Count 4.9 02/16/2021    WBC Count 4.1 01/26/2021    Hemoglobin 16.0 03/09/2021    Hemoglobin 16.5 02/16/2021    Hemoglobin 16.2 01/26/2021    Hematocrit 47.7 03/09/2021    Hematocrit 51.2 (H) 02/16/2021    Hematocrit 49.4 01/26/2021    MCV 83.1 03/09/2021    MCV 82.7 02/16/2021    MCV 80.9 01/26/2021    Platelet Count 309 03/09/2021    Platelet Count 240 02/16/2021    Platelet Count 287 01/26/2021       Lab Results   Component Value Date    Tacrolimus 9.2 03/09/2021    Tacrolimus 5.5 02/16/2021    Tacrolimus 6.8 01/26/2021    Tacrolimus 5.9 06/15/2019    Tacrolimus 5.2 06/09/2019    Tacrolimus 5.0 06/04/2019          Assessment and Plan     Deceased-donor kidney transplant recipient  S/p DDRT 05/20/2019; 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  Monitor renal function monthly.      Immunosuppressive management encounter following kidney transplant  cPRA 10%.  Tacrolimus6 mgin am and6mg  in pmwith goal trough6-8ng/ml.He is unsure if last level was 12 hr  level   Increase MMF to 500 mgtwice a day.  Continue prednisone5 mg daily.  Results of allograft biopsyon 7/8/2021showed no rejection, DSA neg.      HTN (hypertension)  BP controlled    Microhematuria:  CT imaging showing no renal lesions.  Heunderwent cystoscopy which showed no abnormalities.    Post transplant erythrocytosis:  Losartan 50mg  daily.  CT showed no renal lesions.    Hypokalemia: Continue potassium 40 meq. High potassium diet. Recheck K with next labs.    He has not received COVID vaccine , advised to get vaccinated.  Also advised to get influenza vaccine.    Visit Diagnoses and Associated Orders Placed:  1. Status post kidney transplant  potassium chloride (KDUR; KLOR-CON) 20 mEq tablet    predniSONE (DELTASONE) 5 mg tablet   2. Hypokalemia  potassium chloride (KDUR; KLOR-CON) 20 mEq tablet   3. Post-renal transplant erythrocytosis     4. Immunosuppressive management encounter following kidney transplant     5. Microhematuria     6. Legally blind  No orders of the defined types were placed in this encounter.      Nutritional assessment: Patient appears well developed and well-nourished. No need for a dietary consult at this time   Pharmacy assessment: Current medications reviewed and discussed with patient. No need for a pharmacy consult at this time.    Continue to monitor kidney function, CBC, chem-7, drug levels monthly.   Monitor liver function, lipid panel, hemoglobin A1C, intact PTH, urine pr/cr ratio every 3-6 months.       Health Care Maintenance:   Recommend usual age- and sex-appropriate cancer screening plus annual dermatology visit for skin cancer surveillance   Recommend age and disease-appropriate vaccinations (killed vaccines only) including annual influenza vaccine. Live vaccines are contraindicated.     Risk Assessment: The patient is at increased risk of complications including drug toxicity, infection and malignancy as a result of immunosuppression for his  kidney transplant and requires close monitoring of immunosuppression levels and dose adjustment to manage toxicities.   There is no evidence of immunosuppressive drug toxicity at this visit.                        The patient initiated this service. This service is in place of a face to face visit. The patient consents to conduct this visit by telephone because the tools necessary to participate in a video visit were not available at the time of this visit or the patient preferred a telephone visit. I spent a total of 25 minutes in communication with this patient.

## 2021-03-31 LAB — COMPLETE BLOOD COUNT WITH DIFFERENTIAL
% Basophils: 0.8 %
% Eosinophils: 1.3 %
% Lymphocytes: 27.6 %
% Monocytes: 13.1 %
% Neutrophils: 57.2 %
Abs Basophils (cells/uL): 38 cells/uL (ref 0–200)
Abs Eosinophils (cells/uL): 62 cells/uL (ref 15–500)
Abs Lymphocytes: 1325 cells/uL (ref 850–3900)
Abs Monocytes (cells/uL): 629 cells/uL (ref 200–950)
Abs Neutrophils (cells/uL): 2746 cells/uL (ref 1500–7800)
Hematocrit: 49.5 % (ref 38.5–50.0)
Hemoglobin: 15.9 g/dL (ref 13.2–17.1)
MCH: 26.1 pg — ABNORMAL LOW (ref 27.0–33.0)
MCHC: 32.1 g/dL (ref 32.0–36.0)
MCV: 81.1 fL (ref 80.0–100.0)
MPV: 10.5 fL (ref 7.5–12.5)
Platelet Count: 296 10*3/uL (ref 140–400)
RBC Count: 6.1 10*6/uL — ABNORMAL HIGH (ref 4.20–5.80)
RDW-CV: 14.3 % (ref 11.0–15.0)
WBC Count: 4.8 10*3/uL (ref 3.8–10.8)

## 2021-03-31 LAB — BASIC METABOLIC PANEL (NA, K, CL, CO2, BUN, CR, GLU, CA)
BUN/Creatinine Ratio: 11 (calc) (ref 6–22)
Calcium, total, Serum / Plasma: 9.7 mg/dL (ref 8.6–10.3)
Carbon Dioxide, Total: 27 mmol/L (ref 20–32)
Chloride, Serum / Plasma: 104 mmol/L (ref 98–110)
Creatinine, Serum / Plasma: 2.13 mg/dL — ABNORMAL HIGH (ref 0.60–1.29)
Glucose: 81 mg/dL (ref 65–139)
Potassium, Serum / Plasma: 3.1 mmol/L — ABNORMAL LOW (ref 3.5–5.3)
Sodium, Serum / Plasma: 141 mmol/L (ref 135–146)
Urea Nitrogen, Serum / Plasma: 23 mg/dL (ref 7–25)
eGFRcr: 38 mL/min/{1.73_m2} — ABNORMAL LOW (ref >=60)

## 2021-03-31 LAB — PHOSPHORUS, SERUM / PLASMA: Phosphorus, Serum / Plasma: 4.1 mg/dL (ref 2.5–4.5)

## 2021-03-31 LAB — TACROLIMUS, HIGHLY SENSITIVE, LC/MS/MS: Tacrolimus: 5.2 mcg/L

## 2021-03-31 LAB — MAGNESIUM, SERUM / PLASMA: Magnesium, Serum / Plasma: 1.7 mg/dL (ref 1.5–2.5)

## 2021-03-31 NOTE — Progress Notes (Addendum)
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   CPRA: 10%      Next KTU appt: ADVISED FRONT DESK TO SCHEDULE     IS Meds:  Tac 5/6 goal6-8 MMF 500 BID Pred 5     ISSUE(s): 1. Creatinine:  2.13, 1.74, 1.69 baseline 1.5-1.7  2. Tac 5.2    Dose verified by patient.    Patient took meds 10a/10p. Labs drawn at 1029a    Prev levels: 9.2, 5.5   Last dose change: 03/13/21 Decr tac 6/6 to 5/6 for level 9.2  New medication including OTC/herbals: azithromycin, maxitrol   Illnesses: denies   Hydration: less than 2L/day     Per KTU note: Results of allograft biopsyon 7/8/2021showed no rejection, DSA neg.  Post transplant erythrocytosis- Losartan 50mg  daily.    RN action: Sent to Dr for review     Dr. Genene Churn plan-of-care: Needs cause biopsy     RN called patient and discussed plan-of-care. Patient does not want to proceed with biopsy at this time  and prefers to repeat labs next week instead. Per patient, he was not adequately hydrated when he did labs and would prefer to repeat labs before scheduling the biopsy. RN updated Dr Kathleen Argue and Dr Michaelle Birks    Per Dr Genene Churn, Document in his chart

## 2021-04-04 MED ORDER — LOSARTAN 25 MG TABLET
25 | ORAL_TABLET | Freq: Every day | ORAL | 0 refills | Status: AC
Start: 2021-04-04 — End: ?

## 2021-05-05 LAB — COMPLETE BLOOD COUNT WITH DIFFERENTIAL
% Basophils: 0.5 %
% Eosinophils: 1.3 %
% Lymphocytes: 22.8 %
% Monocytes: 13.3 %
% Neutrophils: 62.1 %
Abs Basophils (cells/uL): 19 cells/uL (ref 0–200)
Abs Eosinophils (cells/uL): 49 cells/uL (ref 15–500)
Abs Lymphocytes: 866 cells/uL (ref 850–3900)
Abs Monocytes (cells/uL): 505 cells/uL (ref 200–950)
Abs Neutrophils (cells/uL): 2360 cells/uL (ref 1500–7800)
Hematocrit: 50 % (ref 38.5–50.0)
Hemoglobin: 16.1 g/dL (ref 13.2–17.1)
MCH: 25.6 pg — ABNORMAL LOW (ref 27.0–33.0)
MCHC: 32.2 g/dL (ref 32.0–36.0)
MCV: 79.5 fL — ABNORMAL LOW (ref 80.0–100.0)
MPV: 11.6 fL (ref 7.5–12.5)
Platelet Count: 280 10*3/uL (ref 140–400)
RBC Count: 6.29 10*6/uL — ABNORMAL HIGH (ref 4.20–5.80)
RDW-CV: 13.7 % (ref 11.0–15.0)
WBC Count: 3.8 10*3/uL (ref 3.8–10.8)

## 2021-05-05 LAB — BASIC METABOLIC PANEL (NA, K, CL, CO2, BUN, CR, GLU, CA)
BUN/Creatinine Ratio: 13 (calc) (ref 6–22)
Calcium, total, Serum / Plasma: 10 mg/dL (ref 8.6–10.3)
Carbon Dioxide, Total: 21 mmol/L (ref 20–32)
Chloride, Serum / Plasma: 107 mmol/L (ref 98–110)
Creatinine, Serum / Plasma: 1.35 mg/dL — ABNORMAL HIGH (ref 0.60–1.29)
Glucose: 94 mg/dL (ref 65–99)
Potassium, Serum / Plasma: 3.6 mmol/L (ref 3.5–5.3)
Sodium, Serum / Plasma: 142 mmol/L (ref 135–146)
Urea Nitrogen, Serum / Plasma: 17 mg/dL (ref 7–25)
eGFRcr: 66 mL/min/{1.73_m2} (ref >=60)

## 2021-05-05 LAB — MAGNESIUM, SERUM / PLASMA: Magnesium, Serum / Plasma: 1.6 mg/dL (ref 1.5–2.5)

## 2021-05-05 LAB — TACROLIMUS, HIGHLY SENSITIVE, LC/MS/MS: Tacrolimus: 5.9 mcg/L

## 2021-05-05 LAB — PHOSPHORUS, SERUM / PLASMA: Phosphorus, Serum / Plasma: 3 mg/dL (ref 2.5–4.5)

## 2021-05-25 LAB — BASIC METABOLIC PANEL (NA, K, CL, CO2, BUN, CR, GLU, CA)
BUN/Creatinine Ratio: 12 (calc) (ref 6–22)
Calcium, total, Serum / Plasma: 9.8 mg/dL (ref 8.6–10.3)
Carbon Dioxide, Total: 24 mmol/L (ref 20–32)
Chloride, Serum / Plasma: 106 mmol/L (ref 98–110)
Creatinine, Serum / Plasma: 1.53 mg/dL — ABNORMAL HIGH (ref 0.60–1.29)
Glucose: 102 mg/dL (ref 65–139)
Potassium, Serum / Plasma: 3.4 mmol/L — ABNORMAL LOW (ref 3.5–5.3)
Sodium, Serum / Plasma: 139 mmol/L (ref 135–146)
Urea Nitrogen, Serum / Plasma: 19 mg/dL (ref 7–25)
eGFRcr: 57 mL/min/{1.73_m2} — ABNORMAL LOW (ref >=60)

## 2021-05-25 LAB — COMPLETE BLOOD COUNT WITH DIFFERENTIAL
% Basophils: 1.1 %
% Eosinophils: 1.3 %
% Lymphocytes: 32.5 %
% Monocytes: 13.9 %
% Neutrophils: 51.2 %
Abs Basophils (cells/uL): 42 cells/uL (ref 0–200)
Abs Eosinophils (cells/uL): 49 cells/uL (ref 15–500)
Abs Lymphocytes: 1235 cells/uL (ref 850–3900)
Abs Monocytes (cells/uL): 528 cells/uL (ref 200–950)
Abs Neutrophils (cells/uL): 1946 cells/uL (ref 1500–7800)
Hematocrit: 50.5 % — ABNORMAL HIGH (ref 38.5–50.0)
Hemoglobin: 16.2 g/dL (ref 13.2–17.1)
MCH: 26.3 pg — ABNORMAL LOW (ref 27.0–33.0)
MCHC: 32.1 g/dL (ref 32.0–36.0)
MCV: 81.8 fL (ref 80.0–100.0)
MPV: 11.1 fL (ref 7.5–12.5)
Platelet Count: 307 10*3/uL (ref 140–400)
RBC Count: 6.17 10*6/uL — ABNORMAL HIGH (ref 4.20–5.80)
RDW-CV: 14 % (ref 11.0–15.0)
WBC Count: 3.8 10*3/uL (ref 3.8–10.8)

## 2021-05-25 LAB — TACROLIMUS, HIGHLY SENSITIVE, LC/MS/MS: Tacrolimus: 6.6 mcg/L

## 2021-05-25 LAB — PHOSPHORUS, SERUM / PLASMA: Phosphorus, Serum / Plasma: 3.3 mg/dL (ref 2.5–4.5)

## 2021-05-25 LAB — MAGNESIUM, SERUM / PLASMA: Magnesium, Serum / Plasma: 1.7 mg/dL (ref 1.5–2.5)

## 2021-07-11 ENCOUNTER — Ambulatory Visit: Admit: 2021-07-11 | Discharge: 2021-07-26 | Payer: MEDICARE | Attending: Nephrology | Primary: Physician

## 2021-07-11 DIAGNOSIS — Z79899 Other long term (current) drug therapy: Secondary | ICD-10-CM

## 2021-07-11 DIAGNOSIS — I10 Essential (primary) hypertension: Secondary | ICD-10-CM

## 2021-07-11 MED ORDER — PREDNISONE 5 MG TABLET
5 | ORAL_TABLET | Freq: Every day | ORAL | 11 refills | Status: AC
Start: 2021-07-11 — End: ?

## 2021-07-11 MED ORDER — FUROSEMIDE 20 MG TABLET: 20 mg | ORAL | Status: AC

## 2021-07-11 NOTE — Progress Notes (Signed)
POST-KIDNEY TRANSPLANT NEPHROLOGY PROGRESS NOTE         Subjective      Chief Complaint   Patient presents with    Kidney Transplant Follow-up       History of Present Illness:  [name] is a 46 y.o. year old male S/P kidney transplant on 05/20/2019  being seen for a follow-up visit. The patient feels well and has no new complaints.   Since last visit , he was started on cholesterol pill ( does not remember name) and Lasix daily by PCP.  LE edema has improved.        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 Acute tubular epithelia injury; see comment.   2. No evidence of rejection, negative C4d stain.   2. No significant tubulointerstitial chronicity.   3. Moderate arteriosclerosis, donor-derived.     Special Stain Summary: (x8) PAS and trichrome special stains were  performed and examined to confirm the diagnosis. The findings are  consistent with our H&E-based light microscopic impression. Six  additional PAS-stained level sections were evaluated, and no segmentally  sclerotic glomeruli are identified.     DSAs not done      Immunosuppressive management  encounter following kidney transplant 05/21/2019     Induction agent/dose: Thymo lite  Regime: Tac/MMF  Corticosteroid plan: maintenance   cPRA: 10%      Transplant follow-up 05/21/2019     Category Problem Type of follow-up needed in clinic? Ordered? Scheduled? Comments   Nursing (e.g., drain/ Foley) HD tunneled line refer to IR for removal when appropriate Needs to be done in clinic weekly dressing changes in HD clinic until can be removed   Lab/ Micro Standard Tac Follow up results   Needs to be ordered None   Imaging studies/ procedures NA NA NA None   Referrals Uroglogy for stent removal NA Already ordered None   Delayed graft function and improving UOP, monitor for resolution        Ureteral stent retained of kidney transplant (CMS code) 05/21/2019     Implant Name Type Inv. Item Serial No. Manufacturer Lot No. LRB No. Used Action   STENT URETHRAL 6FR X 16CM DOUBLE J   [id number] - [id number] Stents-Non Des STENT URETHRAL 6FR X 16CM DOUBLE J   [id number]   [id number] Right 1 Implanted     Referral placed for outpatient stent removal    07/01/19 stent removed.       At risk for opportunistic infections 05/21/2019     Transplant Serology Workup:  Recipient        Lab  Results   Component Value Date    HBCAB NEG 12/17/2018    HBSAG NEG 12/17/2018    HBABQ >800 12/17/2018    HCV NEG 12/17/2018    RPR Nonreactive 12/17/2018    CMVAB POS (A) 12/17/2018    VCAM NEG 12/17/2018    EBNA POS (A) 12/17/2018    TOXO NEG 12/17/2018         Donor        Cadaver Donor UNOS ID (no units)   Date Value   05/20/2019 [id number]     Match ID: [id number]          Induction Plan:   Thymo + steroid maintenance     Plan for PPX:   Toxo prophylaxis: (D +/R - Toxo MISMATCH), Septra DS 1 tab PO Daily x 1 month (06/19/2019), then qMWF x 11 months more (05/19/2020) -- 1 year total for Toxo Mismatch   PCP prophylaxis: Septra DS as per above for Toxo ppx   CMV prophylaxis: (D +/R +), Valcyte 900 mg PO Daily (adjusted for renal function) x 3  months (08/18/2019)   Fungal prophylaxis: Fluconazole 100 mg PO q7days x 1 month (06/18/2019)  HBV prophylaxis: Not necessary            At risk of infection transmitted from donor 05/21/2019     Patient underwent transplant with no known donor derived risk factors: Donor is not CDC high risk, not HBV core ab + and there are no known donor derived infections. No need for treatment or monitoring beyond the usual opportunistic infection prophylaxis per protocol. Donor with no known malignancies.  The donor's CMV status is IgG positive and the recipient's CMV status is IgG positive.  Prophylaxis will be determined by risk of CMV transmission.                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     Dialysis Type: HD Hemodialysis  Dialysis Center: [facility]         Phone: [phone]        Fax: [phone]       Address: 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  Been on HD since 2010.  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        Current Outpatient Medications on File Prior to Visit   Medication Sig Dispense Refill    furosemide (LASIX) 20 mg tablet Take 20 mg by mouth daily      acetaminophen (TYLENOL) 500 mg tablet Take 2 tablets (1,000 mg total) by mouth every 8 (eight) hours as needed for Pain (mild pain) Max = 3000 mg/day 50 tablet 0    albuterol 90 mcg/actuation metered dose inhaler Inhale 1-2 puffs into the lungs every 6 (six) hours as needed for Wheezing or  Shortness of Breath  amLODIPine (NORVASC) 10 mg tablet Take 1 tablet (10 mg total) by mouth daily For additional refills please contact your family doctor. 90 tablet 0    docusate sodium (COLACE) 250 mg capsule Take 1 capsule (250 mg total) by mouth Twice a day Hold for loose stools 100 capsule 0    losartan (COZAAR) 25 mg tablet Take 2 tablets (50 mg total) by mouth daily Additional refills please contact Dr. Rayna Sexton 180 tablet 0    mycophenolate (CELLCEPT) 250 mg capsule 03/14/21: Take 2 pill(s) (500 mg) in the AM and 2 pill(s) (500 mg) in the PM,"or as directed" 120 capsule 11    omeprazole (PRILOSEC) 20 mg capsule Take 20 mg by mouth in the morning and at bedtime      oxyCODONE (ROXICODONE) 5 mg tablet Take 1 tablet (5 mg total) by mouth every 6 (six) hours as needed (severe pain) (Patient taking differently: Take 10 mg by mouth every 6 (six) hours as needed (severe pain)) 8 tablet 0    oxyCODONE-acetaminophen (PERCOCET) 10-325 mg tablet Take 1 tablet by mouth every 8 (eight) hours as needed for Pain (Lower back pain)         potassium chloride (KDUR; KLOR-CON) 20 mEq tablet Take 28mEq daily 40 tablet 3    senna (SENOKOT) 8.6 mg tablet Take 2 tablets (17.2 mg total) by mouth nightly as needed for Constipation 100 tablet 0    tacrolimus (PROGRAF) 1 mg capsule 03/14/21: Take 5 mg in AM and 6 mg in PM,use both 1mg  and 5mg  formulations"or as directed" 30 capsule 11    tacrolimus (PROGRAF) 5 mg capsule 03/14/21: Take 5 mg in AM and 6 mg in PM,use both 1mg  and 5mg  formulations"or as directed" 60 capsule 11    [DISCONTINUED] predniSONE (DELTASONE) 5 mg tablet Take 1 tablet (5 mg total) by mouth daily . 30 tablet 11     Current Facility-Administered Medications on File Prior to Visit   Medication Dose Route Frequency Provider Last Rate Last Admin    0.9% sodium chloride bladder irrigation solution  0-3,000 mL Irrigation Once Excell Seltzer, MD        lidocaine (XYLOCAINE URO-JET) 2 %  jelly 10 mL  10 mL Intra-urethral Once Excell Seltzer, MD        nitrofurantoin (macrocrystal-monohydrate) (MACROBID) 100 mg capsule 100 mg  100 mg Oral Once [facility] Dani Gobble, MD                   Objective      Phone visit   Alert   Legally blind     Review of Prior Testing  I personally reviewed data as noted below including the following:    1. Metabolic panel  2. Complete blood count  3. Urine chemistry including level of proteinuria  4. Level of immunosuppressive drug (tacrolimus).          Lab Results   Component Value Date    Creatinine 1.51 (H) 12/24/2019    Creatinine 2.12 (H) 06/15/2019    Creatinine 2.52 (H) 06/09/2019    Creatinine, Serum / Plasma 1.53 (H) 05/23/2021    Creatinine, Serum / Plasma 1.35 (H) 05/04/2021    Creatinine, Serum / Plasma 2.13 (H) 03/30/2021    Urea Nitrogen, Serum / Plasma 19 05/23/2021    Urea Nitrogen, Serum / Plasma 17 05/04/2021    Urea Nitrogen, Serum / Plasma 23 03/30/2021    Sodium, Serum / Plasma 139 05/23/2021    Sodium,  Serum / Plasma 142 05/04/2021    Sodium, Serum / Plasma 141 03/30/2021    Potassium, Serum / Plasma 3.4 (L) 05/23/2021    Potassium, Serum / Plasma 3.6 05/04/2021    Potassium, Serum / Plasma 3.1 (L) 03/30/2021    Carbon Dioxide, Total 24 05/23/2021    Carbon Dioxide, Total 21 05/04/2021    Carbon Dioxide, Total 27 03/30/2021    Calcium, total, Serum / Plasma 9.8 05/23/2021    Calcium, total, Serum / Plasma 10.0 05/04/2021    Calcium, total, Serum / Plasma 9.7 03/30/2021    Phosphorus, Serum / Plasma 3.3 05/23/2021    Phosphorus, Serum / Plasma 3.0 05/04/2021    Phosphorus, Serum / Plasma 4.1 03/30/2021     Lab Results   Component Value Date    WBC Count 3.8 05/23/2021    WBC Count 3.8 05/04/2021    WBC Count 4.8 03/30/2021    Hemoglobin 16.2 05/23/2021    Hemoglobin 16.1 05/04/2021    Hemoglobin 15.9 03/30/2021    Hematocrit 50.5 (H) 05/23/2021    Hematocrit 50.0 05/04/2021    Hematocrit 49.5 03/30/2021    MCV 81.8 05/23/2021    MCV 79.5 (L)  05/04/2021    MCV 81.1 03/30/2021    Platelet Count 307 05/23/2021    Platelet Count 280 05/04/2021    Platelet Count 296 03/30/2021       Lab Results   Component Value Date    Tacrolimus 6.6 05/23/2021    Tacrolimus 5.9 05/04/2021    Tacrolimus 5.2 03/30/2021    Tacrolimus 5.9 06/15/2019    Tacrolimus 5.2 06/09/2019    Tacrolimus 5.0 06/04/2019          Assessment and Plan    Deceased-donor kidney transplant recipient  S/p DDRT 05/20/2019; 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  Monitor renal function monthly.  Repeat UA, urine protein , urine creatinine with next labs.      Immunosuppressive management encounter following kidney transplant  cPRA 10%.  Tacrolimus5 mgin am and6mg  in pmwith goal trough6-8ng/ml.  MMF 500 mgtwice a day.  Continue prednisone5 mg daily.  Results of allograft biopsyon 7/8/2021showed no rejection, DSA neg.      HTN (hypertension)  BP controlledon current regimen.    Microhematuria:  CT imaging showing no renal lesions.  Heunderwent cystoscopy which showed no abnormalities.  Repeat UA     Post transplant erythrocytosis:  Losartan 50mg  daily.  CT showed no renal lesions.    Hypokalemia:Continue potassium 40 meq. High potassium diet. Recheck K with next labs.    He has not received COVID vaccine , advised to get vaccinated, does not want to get COVID vaccine , he received Evusheld before, informed that Evusheld is not as effective against newer variants.  He received  influenza vaccine    Visit Diagnoses and Associated Orders Placed:  1. Immunosuppressive management encounter following kidney transplant        2. Status post kidney transplant  predniSONE (DELTASONE) 5 mg tablet      3. Hypertension, unspecified type        4. Hypokalemia        5. Post-renal transplant erythrocytosis        6. Legally blind        7. Transplant follow-up            Orders Placed This Encounter  predniSONE (DELTASONE) 5 mg tablet     Sig: Take 1 tablet (5 mg total) by mouth daily .     Dispense:  30 tablet     Refill:  11     Kidney Transplant on 05/20/2019. ICD-10 Z94.0       Nutritional assessment: Patient appears well developed and well-nourished. No need for a dietary consult at this time   Pharmacy assessment: Current medications reviewed and discussed with patient. No need for a pharmacy consult at this time.    Continue to monitor kidney function, CBC, chem-7, drug levels monthly.  Monitor liver function, lipid panel, hemoglobin A1C, intact PTH, urine pr/cr ratio every 3-6 months.       Health Care Maintenance:   Recommend usual age- and sex-appropriate cancer screening plus annual dermatology visit for skin cancer surveillance   Recommend age and disease-appropriate vaccinations (killed vaccines only) including annual influenza vaccine. Live vaccines are contraindicated.     Risk Assessment: The patient is at increased risk of complications including drug toxicity, infection and malignancy as a result of immunosuppression for his kidney transplant and requires close monitoring of immunosuppression levels and dose adjustment to manage toxicities.   There is no evidence of immunosuppressive drug toxicity at this visit.                        The patient initiated this service. This service is in place of a face to face visit. The patient consents to conduct this visit by telephone because the tools necessary to participate in a video visit were not available at the time of this visit or the patient preferred a telephone visit. I spent a total of 25 minutes in communication with this patient.

## 2021-07-19 NOTE — Progress Notes (Signed)
Spoke with pt regarding the SLO valid until 01-16-22 and one time order per Dr. Sandi Carne. Requisition # [id number]. Notified Dr. Benancio Deeds.

## 2021-08-14 NOTE — Telephone Encounter (Signed)
LogEvent: [id number]  Call #: [phone]  Time Sent: 08/14/2021 10:03 MT    Caller Name: Michal Callicott  Tel: [phone] Ext:   From: [facility]    Message Type: General Message  Message: Pt. Name: Cody Myers  DOB: 10/04/1975  [facility]  Are you a [facility] or [place] [facility] [place]  Date of transplant (if applicable): 2020  RE: Would like more information about bypass surgery and what your feelings are regarding the surgery.    Callback number: [phone]    For Organization: [place] (Business Hours)  Name:   Role:       [employer]    LogEvent: [id number]

## 2021-08-14 NOTE — Telephone Encounter (Addendum)
RN called patient and was informed re weight gain 225lbs to 292lbs despite exercise and diet.  Denies edema, changes in UO and shortness of breath     Patient asking about gastric bypass for weight loss and wants to know if safe for transplant     RN sent to paneled MD, Dr Michaelle Birks and awaiting for response

## 2021-09-07 LAB — BASIC METABOLIC PANEL (NA, K, CL, CO2, BUN, CR, GLU, CA)
BUN/Creatinine Ratio: 12 (calc) (ref 6–22)
Calcium, total, Serum / Plasma: 10.1 mg/dL (ref 8.6–10.3)
Carbon Dioxide, Total: 26 mmol/L (ref 20–32)
Chloride, Serum / Plasma: 103 mmol/L (ref 98–110)
Creatinine, Serum / Plasma: 1.63 mg/dL — ABNORMAL HIGH (ref 0.60–1.29)
Glucose: 100 mg/dL — ABNORMAL HIGH (ref 65–99)
Potassium, Serum / Plasma: 3.4 mmol/L — ABNORMAL LOW (ref 3.5–5.3)
Sodium, Serum / Plasma: 138 mmol/L (ref 135–146)
Urea Nitrogen, Serum / Plasma: 19 mg/dL (ref 7–25)
eGFRcr: 53 mL/min/{1.73_m2} — ABNORMAL LOW (ref >=60)

## 2021-09-07 LAB — COMPLETE BLOOD COUNT WITH DIFFERENTIAL
% Basophils: 0.8 %
% Eosinophils: 1.3 %
% Lymphocytes: 29.7 %
% Monocytes: 14.2 %
% Neutrophils: 54 %
Abs Basophils (cells/uL): 31 cells/uL (ref 0–200)
Abs Eosinophils (cells/uL): 51 cells/uL (ref 15–500)
Abs Lymphocytes: 1158 cells/uL (ref 850–3900)
Abs Monocytes (cells/uL): 554 cells/uL (ref 200–950)
Abs Neutrophils (cells/uL): 2106 cells/uL (ref 1500–7800)
Hematocrit: 53.6 % — ABNORMAL HIGH (ref 38.5–50.0)
Hemoglobin: 17.3 g/dL — ABNORMAL HIGH (ref 13.2–17.1)
MCH: 25.9 pg — ABNORMAL LOW (ref 27.0–33.0)
MCHC: 32.3 g/dL (ref 32.0–36.0)
MCV: 80.1 fL (ref 80.0–100.0)
MPV: 11 fL (ref 7.5–12.5)
Platelet Count: 301 10*3/uL (ref 140–400)
RBC Count: 6.69 10*6/uL — ABNORMAL HIGH (ref 4.20–5.80)
RDW-CV: 14.3 % (ref 11.0–15.0)
WBC Count: 3.9 10*3/uL (ref 3.8–10.8)

## 2021-09-07 LAB — MAGNESIUM, SERUM / PLASMA: Magnesium, Serum / Plasma: 1.7 mg/dL (ref 1.5–2.5)

## 2021-09-07 LAB — PHOSPHORUS, SERUM / PLASMA: Phosphorus, Serum / Plasma: 3.2 mg/dL (ref 2.5–4.5)

## 2021-09-07 LAB — TACROLIMUS, HIGHLY SENSITIVE, LC/MS/MS: Tacrolimus: 6.7 mcg/L

## 2021-09-18 LAB — ALLOMAP/ALLOSURE - EXTERNAL
Allosure Test - External: 0.16 %
Allosure Test - External: 0.24 %

## 2021-09-18 NOTE — Progress Notes (Signed)
Labs entered by Sandra Rhodes

## 2021-10-24 LAB — BASIC METABOLIC PANEL (NA, K, CL, CO2, BUN, CR, GLU, CA)
BUN/Creatinine Ratio: 11 (calc) (ref 6–22)
Calcium, total, Serum / Plasma: 9.8 mg/dL (ref 8.6–10.3)
Carbon Dioxide, Total: 27 mmol/L (ref 20–32)
Chloride, Serum / Plasma: 105 mmol/L (ref 98–110)
Creatinine, Serum / Plasma: 1.61 mg/dL — ABNORMAL HIGH (ref 0.60–1.29)
Glucose: 109 mg/dL — ABNORMAL HIGH (ref 65–99)
Potassium, Serum / Plasma: 3.1 mmol/L — ABNORMAL LOW (ref 3.5–5.3)
Sodium, Serum / Plasma: 141 mmol/L (ref 135–146)
Urea Nitrogen, Serum / Plasma: 17 mg/dL (ref 7–25)
eGFRcr: 53 mL/min/{1.73_m2} — ABNORMAL LOW (ref >=60)

## 2021-10-24 LAB — COMPLETE BLOOD COUNT WITH DIFFERENTIAL
% Basophils: 0.9 %
% Eosinophils: 1.2 %
% Lymphocytes: 26.3 %
% Monocytes: 11.1 %
% Neutrophils: 60.5 %
Abs Basophils (cells/uL): 39 cells/uL (ref 0–200)
Abs Eosinophils (cells/uL): 52 cells/uL (ref 15–500)
Abs Lymphocytes: 1131 cells/uL (ref 850–3900)
Abs Monocytes (cells/uL): 477 cells/uL (ref 200–950)
Abs Neutrophils (cells/uL): 2602 cells/uL (ref 1500–7800)
Hematocrit: 52.9 % — ABNORMAL HIGH (ref 38.5–50.0)
Hemoglobin: 16.9 g/dL (ref 13.2–17.1)
MCH: 26.1 pg — ABNORMAL LOW (ref 27.0–33.0)
MCHC: 31.9 g/dL — ABNORMAL LOW (ref 32.0–36.0)
MCV: 81.8 fL (ref 80.0–100.0)
MPV: 10.5 fL (ref 7.5–12.5)
Platelet Count: 291 10*3/uL (ref 140–400)
RBC Count: 6.47 10*6/uL — ABNORMAL HIGH (ref 4.20–5.80)
RDW-CV: 14.7 % (ref 11.0–15.0)
WBC Count: 4.3 10*3/uL (ref 3.8–10.8)

## 2021-10-24 LAB — MAGNESIUM, SERUM / PLASMA: Magnesium, Serum / Plasma: 1.7 mg/dL (ref 1.5–2.5)

## 2021-10-24 LAB — PHOSPHORUS, SERUM / PLASMA: Phosphorus, Serum / Plasma: 3 mg/dL (ref 2.5–4.5)

## 2021-10-24 LAB — TACROLIMUS, HIGHLY SENSITIVE, LC/MS/MS: Tacrolimus: 5.2 mcg/L

## 2021-10-24 NOTE — Telephone Encounter (Signed)
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   CPRA: 10%      Next KTU appt: 11/16/2021 Erline Levine, MD    IS Meds:  Tac 5/6 goal6-8 MMF 500 BID Pred 5     Creatinine: 1.61 prev 1.63, 1.53 baseline 1.5-1.7    ISSUE(s): 1. 10/23/21 08:50 Potassium: 3.1 (L) prev 3.4, 3.4   2. 10/23/21 08:50 Tacrolimus: 5.2    Prev   09/05/21 09:51 Tacrolimus: 6.7  05/23/21 10:10 Tacrolimus: 6.6    Dose 5/6 verified by patient. Pt states takes meds at 10a/10p.  Labs drawn at 0850    Last dose change: >6 months   New medication including OTC/herbals: currently on unknown antibiotic for URI, 1 dose left  Illnesses, fevers, allograft pain: denies   Hydration: 64 oz/day     Patient not taking potassium pills for 2 months d/t no refills   Patient not taking Furosemide   Denies s/sx of hypokalemia     Sent to [facility] for review     Plan-of-care: Increase tacro 6/6mg , reorder Potassium 40 meq daily or 2 weeks

## 2021-10-25 MED ORDER — TACROLIMUS 5 MG CAPSULE, IMMEDIATE-RELEASE
5 | ORAL_CAPSULE | ORAL | 11 refills | 30.00000 days | Status: DC
Start: 2021-10-25 — End: 2022-01-23

## 2021-10-25 MED ORDER — TACROLIMUS 1 MG CAPSULE, IMMEDIATE-RELEASE
1 mg | ORAL_CAPSULE | ORAL | 11 refills | Status: DC
Start: 2021-10-25 — End: 2022-01-23

## 2021-10-25 MED ORDER — POTASSIUM CHLORIDE ER 20 MEQ TABLET,EXTENDED RELEASE(PART/CRYST)
20 | ORAL_TABLET | ORAL | 0 refills | Status: AC
Start: 2021-10-25 — End: ?

## 2021-11-16 ENCOUNTER — Telehealth: Admit: 2021-11-16 | Payer: MEDICARE | Attending: Nephrology | Primary: Physician

## 2021-11-16 DIAGNOSIS — Z940 Kidney transplant status: Secondary | ICD-10-CM

## 2021-11-16 NOTE — Progress Notes (Signed)
1 x order has been enter at [facility] for UA,UC, UPC, AND A1C.  REQUISITION # [id number]

## 2021-11-16 NOTE — Progress Notes (Signed)
POST-KIDNEY TRANSPLANT NEPHROLOGY PROGRESS NOTE         Subjective      Chief Complaint   Patient presents with    Kidney Transplant Follow-up       History of Present Illness:  [name] is a 46 y.o. year old male S/P kidney transplant on 05/20/2019 being seen for a follow-up visit. The patient feels well and has no new complaints.     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 Acute tubular epithelia injury; see comment.   2. No evidence of rejection, negative C4d stain.   2. No significant tubulointerstitial chronicity.   3. Moderate arteriosclerosis, donor-derived.     Special Stain Summary: (x8) PAS and trichrome special stains were  performed and examined to confirm the diagnosis. The findings are  consistent with our H&E-based light microscopic impression. Six  additional PAS-stained level sections were evaluated, and no segmentally  sclerotic glomeruli are identified.     DSAs not done      Immunosuppressive management encounter following kidney transplant 05/21/2019      Induction agent/dose: Thymo lite  Regime: Tac/MMF  Corticosteroid plan: maintenance   cPRA: 10%      Transplant follow-up 05/21/2019     Category Problem Type of follow-up needed in clinic? Ordered? Scheduled? Comments   Nursing (e.g., drain/ Foley) HD tunneled line refer to IR for removal when appropriate Needs to be done in clinic weekly dressing changes in HD clinic until can be removed   Lab/ Micro Standard Tac Follow up results   Needs to be ordered None   Imaging studies/ procedures NA NA NA None   Referrals Uroglogy for stent removal NA Already ordered None   Delayed graft function and improving UOP, monitor for resolution        Ureteral stent retained of kidney transplant (CMS code) 05/21/2019     Implant Name Type Inv. Item Serial No. Manufacturer Lot No. LRB No. Used Action   STENT URETHRAL 6FR X 16CM DOUBLE J   [id number] - [id number] Stents-Non Des STENT URETHRAL 6FR X 16CM DOUBLE J   [id number]   [id number] Right 1 Implanted     Referral placed for outpatient stent removal    07/01/19 stent removed.       At risk for opportunistic infections 05/21/2019     Transplant Serology Workup:  Recipient        Lab Results   Component Value Date    HBCAB NEG 12/17/2018  HBSAG NEG 12/17/2018    HBABQ >800 12/17/2018    HCV NEG 12/17/2018    RPR Nonreactive 12/17/2018    CMVAB POS (A) 12/17/2018    VCAM NEG 12/17/2018    EBNA POS (A) 12/17/2018    TOXO NEG 12/17/2018         Donor        Cadaver Donor UNOS ID (no units)   Date Value   05/20/2019 [id number]     Match ID: [id number]          Induction Plan:   Thymo + steroid maintenance     Plan for PPX:   Toxo prophylaxis: (D +/R - Toxo MISMATCH), Septra DS 1 tab PO Daily x 1 month (06/19/2019), then qMWF x 11 months more (05/19/2020) -- 1 year total for Toxo Mismatch   PCP prophylaxis: Septra DS as per above for Toxo ppx   CMV prophylaxis: (D +/R +), Valcyte 900 mg PO Daily (adjusted for renal function) x 3 months (08/18/2019)   Fungal prophylaxis: Fluconazole 100  mg PO q7days x 1 month (06/18/2019)  HBV prophylaxis: Not necessary            At risk of infection transmitted from donor 05/21/2019     Patient underwent transplant with no known donor derived risk factors: Donor is not CDC high risk, not HBV core ab + and there are no known donor derived infections. No need for treatment or monitoring beyond the usual opportunistic infection prophylaxis per protocol. Donor with no known malignancies.  The donor's CMV status is IgG positive and the recipient's CMV status is IgG positive.  Prophylaxis will be determined by risk of CMV transmission.                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     Dialysis Type: HD Hemodialysis  Dialysis Center: [facility]         Phone: [phone]        Fax: [phone]       Address: 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  Been on HD since 2010.  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        Current Outpatient Medications on File Prior to Visit   Medication Sig Dispense Refill    acetaminophen (TYLENOL) 500 mg tablet Take 2 tablets (1,000 mg total) by mouth every 8 (eight) hours as needed for Pain (mild pain) Max = 3000 mg/day 50 tablet 0    albuterol 90 mcg/actuation metered dose inhaler Inhale 1-2 puffs into the lungs every 6 (six) hours as needed for Wheezing or Shortness of Breath         amLODIPine (NORVASC) 10 mg tablet Take 1 tablet (10 mg total) by mouth daily For additional  refills please contact your family doctor. 90 tablet 0  docusate sodium (COLACE) 250 mg capsule Take 1 capsule (250 mg total) by mouth Twice a day Hold for loose stools 100 capsule 0    furosemide (LASIX) 20 mg tablet Take 20 mg by mouth daily      losartan (COZAAR) 25 mg tablet Take 2 tablets (50 mg total) by mouth daily Additional refills please contact Dr. Lonzo Candy 180 tablet 0    mycophenolate (CELLCEPT) 250 mg capsule 03/14/21: Take 2 pill(s) (500 mg) in the AM and 2 pill(s) (500 mg) in the PM,"or as directed" 120 capsule 11    omeprazole (PRILOSEC) 20 mg capsule Take 20 mg by mouth in the morning and at bedtime      oxyCODONE (ROXICODONE) 5 mg tablet Take 1 tablet (5 mg total) by mouth every 6 (six) hours as needed (severe pain) (Patient taking differently: Take 10 mg by mouth every 6 (six) hours as needed (severe pain)) 8 tablet 0    oxyCODONE-acetaminophen (PERCOCET) 10-325 mg tablet Take 1 tablet by mouth every 8 (eight) hours as needed for Pain (Lower back pain)         potassium chloride (KDUR; KLOR-CON) 20 mEq tablet Take 2 pills (40 mEq) daily for 2 weeks, "or as directed" 28 tablet 0    predniSONE (DELTASONE) 5 mg tablet Take 1 tablet (5 mg total) by mouth daily . 30 tablet 11    senna (SENOKOT) 8.6 mg tablet Take 2 tablets (17.2 mg total) by mouth nightly as needed for Constipation 100 tablet 0    tacrolimus (PROGRAF) 1 mg capsule 10/25/21: Take 6 mg in AM and 6 mg in PM,use both  and  formulations"or as directed" 60 capsule 11    tacrolimus (PROGRAF) 5 mg capsule 10/25/21: Take 6 mg in AM and 6 mg in PM,use both  and  formulations"or as directed" 60 capsule 11     Current Facility-Administered Medications on File Prior to Visit   Medication Dose Route Frequency Provider Last Rate Last Admin    0.9% sodium chloride bladder irrigation solution  0-3,000 mL Irrigation Once Gerrianne Scale, MD        lidocaine (XYLOCAINE URO-JET) 2 % jelly 10 mL  10 mL  Intra-urethral Once Gerrianne Scale, MD        nitrofurantoin (macrocrystal-monohydrate) (MACROBID) 100 mg capsule 100 mg  100 mg Oral Once [facility] Donne Hazel, MD                   Objective          Alert  Phone visit      Review of Prior Testing  I personally reviewed data  as noted below including the following:    1. Metabolic panel  2. Complete blood count  3. Urine chemistry including level of proteinuria  4. Level of immunosuppressive drug (tacrolimus).          Lab Results   Component Value Date    Creatinine 1.51 (H) 12/24/2019    Creatinine 2.12 (H) 06/15/2019    Creatinine 2.52 (H) 06/09/2019    Creatinine, Serum / Plasma 1.61 (H) 10/23/2021    Creatinine, Serum / Plasma 1.63 (H) 09/05/2021    Creatinine, Serum / Plasma 1.53 (H) 05/23/2021    Urea Nitrogen, Serum / Plasma 17 10/23/2021    Urea Nitrogen, Serum / Plasma 19 09/05/2021    Urea Nitrogen, Serum / Plasma 19 05/23/2021    Sodium, Serum / Plasma 141 10/23/2021    Sodium, Serum / Plasma  138 09/05/2021    Sodium, Serum / Plasma 139 05/23/2021    Potassium, Serum / Plasma 3.1 (L) 10/23/2021    Potassium, Serum / Plasma 3.4 (L) 09/05/2021    Potassium, Serum / Plasma 3.4 (L) 05/23/2021    Carbon Dioxide, Total 27 10/23/2021    Carbon Dioxide, Total 26 09/05/2021    Carbon Dioxide, Total 24 05/23/2021    Calcium, total, Serum / Plasma 9.8 10/23/2021    Calcium, total, Serum / Plasma 10.1 09/05/2021    Calcium, total, Serum / Plasma 9.8 05/23/2021    Phosphorus, Serum / Plasma 3.0 10/23/2021    Phosphorus, Serum / Plasma 3.2 09/05/2021    Phosphorus, Serum / Plasma 3.3 05/23/2021     Lab Results   Component Value Date    WBC Count 4.3 10/23/2021    WBC Count 3.9 09/05/2021    WBC Count 3.8 05/23/2021    Hemoglobin 16.9 10/23/2021    Hemoglobin 17.3 (H) 09/05/2021    Hemoglobin 16.2 05/23/2021    Hematocrit 52.9 (H) 10/23/2021    Hematocrit 53.6 (H) 09/05/2021    Hematocrit 50.5 (H) 05/23/2021    MCV 81.8 10/23/2021    MCV 80.1 09/05/2021    MCV  81.8 05/23/2021    Platelet Count 291 10/23/2021    Platelet Count 301 09/05/2021    Platelet Count 307 05/23/2021       Lab Results   Component Value Date    Tacrolimus 5.2 10/23/2021    Tacrolimus 6.7 09/05/2021    Tacrolimus 6.6 05/23/2021    Tacrolimus 5.9 06/15/2019    Tacrolimus 5.2 06/09/2019    Tacrolimus 5.0 06/04/2019          Assessment and Plan    Deceased-donor kidney transplant recipient  S/p DDRT 05/20/2019; 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  Repeat UA, urine protein , urine creatinine with next labs.      Immunosuppressive management encounter following kidney transplant  cPRA 10%.  Tacrolimus6mg in am and6mg  in pmwith goal trough6-8ng/ml.  MMF500mg twice a day.  Continue prednisone5 mg daily.  Results of allograft biopsyon 7/8/2021showed no rejection, DSA neg.      HTN (hypertension)  BP controlledon current regimen.    Microhematuria:  CT imaging showing no renal lesions.  Heunderwent cystoscopy which showed no abnormalities.      Post transplant erythrocytosis:  Losartan  daily.  CT showed no renal lesions.  Repeat CBC with next labs.  Will need phlebotomy for Hct > 55%    Hypokalemia:  High potassium diet.   He is also on KCl       Vaccine counseling:    He has not received COVID vaccine , advised to get vaccinated, does not want to get COVID vaccine , he received Evusheld before, informed that Evusheld is not as effective against newer variants.  He received  influenza vaccine    Visit Diagnoses and Associated Orders Placed:  1. Status post deceased-donor kidney transplantation        2. Immunosuppressive management encounter following kidney transplant        3. Hypertension, unspecified type        4. Microhematuria        5. Erythrocytosis        6. Hypokalemia        7. Vaccine counseling           No orders of the defined types were placed in this  encounter.      Nutritional  assessment: Patient appears well developed and well-nourished. No need for a dietary consult at this time   Pharmacy assessment: Current medications reviewed and discussed with patient. No need for a pharmacy consult at this time.    Continue to monitor kidney function, CBC, chem-7, drug levels monthly.  Monitor liver function, lipid panel, hemoglobin A1C, intact PTH, urine pr/cr ratio every 3-6 months.       Health Care Maintenance:   Recommend usual age- and sex-appropriate cancer screening plus annual dermatology visit for skin cancer surveillance   Recommend age and disease-appropriate vaccinations (killed vaccines only) including annual influenza vaccine. Live vaccines are contraindicated.     Risk Assessment: The patient is at increased risk of complications including drug toxicity, infection and malignancy as a result of immunosuppression for his kidney transplant and requires close monitoring of immunosuppression levels and dose adjustment to manage toxicities.   There is no evidence of immunosuppressive drug toxicity at this visit.                      The patient initiated this service. This service is in place of a face to face visit. The patient consents to conduct this visit by telephone because the tools necessary to participate in a video visit were not available at the time of this visit or the patient preferred a telephone visit. I spent a total of 30 minutes in communication with this patient.

## 2021-11-30 LAB — COMPLETE BLOOD COUNT WITH DIFFERENTIAL
% Basophils: 1 %
% Eosinophils: 1.6 %
% Lymphocytes: 38.7 %
% Monocytes: 12.9 %
% Neutrophils: 45.8 %
Abs Basophils (cells/uL): 31 cells/uL (ref 0–200)
Abs Eosinophils (cells/uL): 50 cells/uL (ref 15–500)
Abs Lymphocytes: 1200 cells/uL (ref 850–3900)
Abs Monocytes (cells/uL): 400 cells/uL (ref 200–950)
Abs Neutrophils (cells/uL): 1420 cells/uL — ABNORMAL LOW (ref 1500–7800)
Hematocrit: 50.7 % — ABNORMAL HIGH (ref 38.5–50.0)
Hemoglobin: 16.5 g/dL (ref 13.2–17.1)
MCH: 26.4 pg — ABNORMAL LOW (ref 27.0–33.0)
MCHC: 32.5 g/dL (ref 32.0–36.0)
MCV: 81 fL (ref 80.0–100.0)
MPV: 10.6 fL (ref 7.5–12.5)
Platelet Count: 278 10*3/uL (ref 140–400)
RBC Count: 6.26 10*6/uL — ABNORMAL HIGH (ref 4.20–5.80)
RDW-CV: 14.3 % (ref 11.0–15.0)
WBC Count: 3.1 10*3/uL — ABNORMAL LOW (ref 3.8–10.8)

## 2021-11-30 LAB — BASIC METABOLIC PANEL (NA, K, CL, CO2, BUN, CR, GLU, CA)
BUN/Creatinine Ratio: 10 (calc) (ref 6–22)
Calcium, total, Serum / Plasma: 9.8 mg/dL (ref 8.6–10.3)
Carbon Dioxide, Total: 23 mmol/L (ref 20–32)
Chloride, Serum / Plasma: 108 mmol/L (ref 98–110)
Creatinine, Serum / Plasma: 1.54 mg/dL — ABNORMAL HIGH (ref 0.60–1.29)
Glucose: 110 mg/dL — ABNORMAL HIGH (ref 65–99)
Potassium, Serum / Plasma: 3.5 mmol/L (ref 3.5–5.3)
Sodium, Serum / Plasma: 142 mmol/L (ref 135–146)
Urea Nitrogen, Serum / Plasma: 15 mg/dL (ref 7–25)
eGFRcr: 56 mL/min/{1.73_m2} — ABNORMAL LOW (ref >=60)

## 2021-11-30 LAB — TACROLIMUS, HIGHLY SENSITIVE, LC/MS/MS: Tacrolimus: 6 mcg/L

## 2021-11-30 LAB — PHOSPHORUS, SERUM / PLASMA: Phosphorus, Serum / Plasma: 3.5 mg/dL (ref 2.5–4.5)

## 2021-11-30 LAB — MAGNESIUM, SERUM / PLASMA: Magnesium, Serum / Plasma: 1.9 mg/dL (ref 1.5–2.5)

## 2021-12-18 NOTE — Telephone Encounter (Signed)
Spoke to pt to follow up on Tacrolimus medication refill.  Pt picked up refill during the refill. Pt currently has refills at [facility].  No further action needed

## 2022-01-17 LAB — ALLOMAP/ALLOSURE - EXTERNAL: Allosure Test - External: 0.12 %

## 2022-01-17 NOTE — Progress Notes (Signed)
Labs entered by Sandra Rhodes

## 2022-01-17 NOTE — Result Quicknote (Signed)
Hello Dr Marena Chancy,   Please review and advise plan-of-care for Tac?    Thanks,   Jessa  =================================  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   CPRA: 10%      Next KTU appt: 05/21/2022 POST TRANSPLANT MD    IS Meds:  Tac 6/6 goal 6-8 MMF 500 BID Pred 5     ISSUE(s): 1. Creatinine: 1.92 prev 1.54, 1.61  baseline 1.5-1.7  2. 01/17/22 09:05 Tacrolimus: 10.7    Prev levels:  11/29/21 09:58 Tacrolimus: 6.0  10/23/21 08:50 Tacrolimus: 5.2    Dose 6/6 verified by patient. Pt states this is 11.5 trough level   Last dose change: 10/24/21 Incr Tac 5/6 to 6/6 for level 5.2   New medication including OTC/herbals: patient has completed cephalexin, bactrim for abscess on buttocks   Illnesses, fevers, allograft pain: diarrhea after antibiotics which has resolved  Hydrate: 64 oz/day     Per KTU note: Results of allograft biopsyon 7/8/2021showed no rejection, DSA neg.

## 2022-01-17 NOTE — Telephone Encounter (Signed)
-----   Message from Sandi Carne, MD sent at 01/17/2022  4:04 PM PDT -----  Thanks, look good  ----- Message -----  From: Modesto Charon, RN  Sent: 01/17/2022  10:04 AM PDT  To: Sandi Carne, MD    Hello Dr Phoebe Perch Allosure results for this patient.     Thanks   [employer]

## 2022-01-17 NOTE — Result Quicknote (Signed)
Hello Dr Michaelle Birks,     [facility] Allosure results for this patient.     Thanks   [employer]

## 2022-01-18 LAB — COMPLETE BLOOD COUNT WITH DIFFERENTIAL
% Basophils: 0.9 %
% Eosinophils: 1.6 %
% Lymphocytes: 33.3 %
% Monocytes: 11.5 %
% Neutrophils: 52.7 %
Abs Basophils (cells/uL): 39 cells/uL (ref 0–200)
Abs Eosinophils (cells/uL): 69 cells/uL (ref 15–500)
Abs Lymphocytes: 1432 cells/uL (ref 850–3900)
Abs Monocytes (cells/uL): 495 cells/uL (ref 200–950)
Abs Neutrophils (cells/uL): 2266 cells/uL (ref 1500–7800)
Hematocrit: 50.4 % — ABNORMAL HIGH (ref 38.5–50.0)
Hemoglobin: 16.5 g/dL (ref 13.2–17.1)
MCH: 26.4 pg — ABNORMAL LOW (ref 27.0–33.0)
MCHC: 32.7 g/dL (ref 32.0–36.0)
MCV: 80.8 fL (ref 80.0–100.0)
MPV: 10.8 fL (ref 7.5–12.5)
Platelet Count: 274 10*3/uL (ref 140–400)
RBC Count: 6.24 10*6/uL — ABNORMAL HIGH (ref 4.20–5.80)
RDW-CV: 14.3 % (ref 11.0–15.0)
WBC Count: 4.3 10*3/uL (ref 3.8–10.8)

## 2022-01-18 LAB — BASIC METABOLIC PANEL (NA, K, CL, CO2, BUN, CR, GLU, CA)
BUN/Creatinine Ratio: 11 (calc) (ref 6–22)
Calcium, total, Serum / Plasma: 10.1 mg/dL (ref 8.6–10.3)
Carbon Dioxide, Total: 21 mmol/L (ref 20–32)
Chloride, Serum / Plasma: 107 mmol/L (ref 98–110)
Creatinine, Serum / Plasma: 1.92 mg/dL — ABNORMAL HIGH (ref 0.60–1.29)
Glucose: 103 mg/dL — ABNORMAL HIGH (ref 65–99)
Potassium, Serum / Plasma: 4 mmol/L (ref 3.5–5.3)
Sodium, Serum / Plasma: 138 mmol/L (ref 135–146)
Urea Nitrogen, Serum / Plasma: 21 mg/dL (ref 7–25)
eGFRcr: 43 mL/min/{1.73_m2} — ABNORMAL LOW (ref >=60)

## 2022-01-18 LAB — TACROLIMUS, HIGHLY SENSITIVE, LC/MS/MS: Tacrolimus: 10.7 mcg/L

## 2022-01-18 LAB — MAGNESIUM, SERUM / PLASMA: Magnesium, Serum / Plasma: 1.8 mg/dL (ref 1.5–2.5)

## 2022-01-18 LAB — PHOSPHORUS, SERUM / PLASMA: Phosphorus, Serum / Plasma: 3.9 mg/dL (ref 2.5–4.5)

## 2022-01-19 NOTE — Telephone Encounter (Signed)
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   CPRA: 10%       Next KTU appt: 05/21/2022 POST TRANSPLANT MD     IS Meds:  Tac 6/6 goal 6-8 MMF 500 BID Pred 5       ISSUE(s): 1. Creatinine: 1.92 prev 1.54, 1.61  baseline 1.5-1.7  2. 01/17/22 09:05 Tacrolimus: 10.7    Prev levels:  11/29/21 09:58 Tacrolimus: 6.0  10/23/21 08:50 Tacrolimus: 5.2    Dose 6/6 verified by patient. Pt states this is 11.5 trough level   Last dose change: 10/24/21 Incr Tac 5/6 to 6/6 for level 5.2   New medication including OTC/herbals: patient has completed cephalexin, bactrim for abscess on buttocks   Illnesses, fevers, allograft pain: diarrhea after antibiotics which has resolved  Hydrate:     RN sent message [facility] in [place] to follow up Tacrolimus 5 mg pills as patient has not been receiving     Per KTU note: Results of allograft biopsy on 12/24/2019  showed no rejection, DSA neg.     Sent to Dr Marena Chancy for review

## 2022-01-22 NOTE — Telephone Encounter (Signed)
-----   Message from Tory Emerald sent at 01/22/2022  7:29 AM PDT -----  Regarding: RE: Tac  Hi,    This patient has not filled with our location for almost a year and has been going to [facility].  Since he's filling at other stores, I cannot speak as to why they have been giving him issues with billing or on the specifics. From first glance I see incidences of stores billing Part D when it should be Part B and I see that for some reason he was only given 11 caps of the tacrolimus 1 mg most recently. Has the patient inquired with his local stores why are not/cannot fill the tacrolimus 5 mg? Unfortunately many local stores do not understand the nuances for Medicare billing.   We can re-assume care for his immunosuppressants but he will have to speak with someone from our staff each month to coordinate delivery for them.    Thanks, Revonda Standard     ----- Message -----  From: Modesto Charon, RN  Sent: 01/19/2022   5:15 PM PDT  To: Lonna Cobb, LVN, Gerlene Burdock, #  Subject: Tac                                              Hello team,     I got a call from patient that he has not been getting his Tacrolimus 5 mg tablets? Was there an issue with insurance. Can we send Tacrolimus 1 mg pills instead? Patient will ran out soon.     His total daily dose is Tacrolimus 6 mg in AM and 6 mg in PM    Please assist.     Thank you,   Burnett Sheng

## 2022-01-22 NOTE — Telephone Encounter (Signed)
-----   Message from Allison Ngan sent at 01/22/2022  7:29 AM PDT -----  Regarding: RE: Tac  Hi,    This patient has not filled with our location for almost a year and has been going to local [facility].  Since he's filling at other stores, I cannot speak as to why they have been giving him issues with billing or on the specifics. From first glance I see incidences of stores billing Part D when it should be Part B and I see that for some reason he was only given 11 caps of the tacrolimus 1 mg most recently. Has the patient inquired with his local stores why are not/cannot fill the tacrolimus 5 mg? Unfortunately many local stores do not understand the nuances for Medicare billing.   We can re-assume care for his immunosuppressants but he will have to speak with someone from our staff each month to coordinate delivery for them.    Thanks, Allison     ----- Message -----  From: Tanyon Alipio Jesusa A Gordie Crumby, RN  Sent: 01/19/2022   5:15 PM PDT  To: Narcisa Cabornay, LVN, Marnel Irene Valle, #  Subject: Tac                                              Hello team,     I got a call from patient that he has not been getting his Tacrolimus 5 mg tablets? Was there an issue with insurance. Can we send Tacrolimus 1 mg pills instead? Patient will ran out soon.     His total daily dose is Tacrolimus 6 mg in AM and 6 mg in PM    Please assist.     Thank you,   Jessa

## 2022-01-23 NOTE — Telephone Encounter (Signed)
-----   Message from Carlyle Lipa, MD sent at 01/23/2022 12:37 PM PDT -----  Back to tac 5/6  L;abs in 2 weeks  ----- Message -----  From: Modesto Charon, RN  Sent: 01/23/2022  12:29 PM PDT  To: Carlyle Lipa, MD    Hello Dr Marena Chancy,   Please review and advise plan-of-care for Tac?    Thanks,   Jessa  =================================  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   CPRA: 10%       Next KTU appt: 05/21/2022 POST TRANSPLANT MD     IS Meds:  Tac 6/6 goal 6-8 MMF 500 BID Pred 5       ISSUE(s): 1. Creatinine: 1.92 prev 1.54, 1.61  baseline 1.5-1.7  2. 01/17/22 09:05 Tacrolimus: 10.7    Prev levels:  11/29/21 09:58 Tacrolimus: 6.0  10/23/21 08:50 Tacrolimus: 5.2    Dose 6/6 verified by patient. Pt states this is 11.5 trough level   Last dose change: 10/24/21 Incr Tac 5/6 to 6/6 for level 5.2   New medication including OTC/herbals: patient has completed cephalexin, bactrim for abscess on buttocks   Illnesses, fevers, allograft pain: diarrhea after antibiotics which has resolved  Hydrate: 64 oz/day     Per KTU note: Results of allograft biopsyon 7/8/2021showed no rejection, DSA neg.

## 2022-01-24 MED ORDER — MYCOPHENOLATE MOFETIL 250 MG CAPSULE
250 | ORAL_CAPSULE | ORAL | 11 refills | 30.00000 days | Status: DC
Start: 2022-01-24 — End: 2023-01-31

## 2022-01-24 MED ORDER — TACROLIMUS 1 MG CAPSULE, IMMEDIATE-RELEASE
1 mg | ORAL_CAPSULE | ORAL | 11 refills | Status: DC
Start: 2022-01-24 — End: 2022-01-24

## 2022-01-24 MED ORDER — TACROLIMUS 1 MG CAPSULE, IMMEDIATE-RELEASE
1 mg | ORAL_CAPSULE | Freq: Every day | ORAL | 11 refills | Status: DC
Start: 2022-01-24 — End: 2022-02-15

## 2022-01-24 MED ORDER — TACROLIMUS 5 MG CAPSULE, IMMEDIATE-RELEASE
5 | ORAL_CAPSULE | ORAL | 11 refills | 30.00000 days | Status: DC
Start: 2022-01-24 — End: 2022-01-24

## 2022-01-24 MED ORDER — TACROLIMUS 5 MG CAPSULE, IMMEDIATE-RELEASE
5 | ORAL_CAPSULE | Freq: Two times a day (BID) | ORAL | 11 refills | 30.00000 days | Status: DC
Start: 2022-01-24 — End: 2022-02-15

## 2022-02-14 LAB — COMPLETE BLOOD COUNT WITH DIFFERENTIAL
% Basophils: 0.4 %
% Eosinophils: 1.1 %
% Lymphocytes: 32.1 %
% Monocytes: 11.6 %
% Neutrophils: 54.8 %
Abs Basophils (cells/uL): 18 cells/uL (ref 0–200)
Abs Eosinophils (cells/uL): 50 cells/uL (ref 15–500)
Abs Lymphocytes: 1445 cells/uL (ref 850–3900)
Abs Monocytes (cells/uL): 522 cells/uL (ref 200–950)
Abs Neutrophils (cells/uL): 2466 cells/uL (ref 1500–7800)
Hematocrit: 47.9 % (ref 38.5–50.0)
Hemoglobin: 16 g/dL (ref 13.2–17.1)
MCH: 26.4 pg — ABNORMAL LOW (ref 27.0–33.0)
MCHC: 33.4 g/dL (ref 32.0–36.0)
MCV: 79.2 fL — ABNORMAL LOW (ref 80.0–100.0)
MPV: 11.1 fL (ref 7.5–12.5)
Platelet Count: 251 10*3/uL (ref 140–400)
RBC Count: 6.05 10*6/uL — ABNORMAL HIGH (ref 4.20–5.80)
RDW-CV: 14.6 % (ref 11.0–15.0)
WBC Count: 4.5 10*3/uL (ref 3.8–10.8)

## 2022-02-14 LAB — MAGNESIUM, SERUM / PLASMA: Magnesium, Serum / Plasma: 2 mg/dL (ref 1.5–2.5)

## 2022-02-14 LAB — BASIC METABOLIC PANEL (NA, K, CL, CO2, BUN, CR, GLU, CA)
BUN/Creatinine Ratio: 14 (calc) (ref 6–22)
Calcium, total, Serum / Plasma: 9.7 mg/dL (ref 8.6–10.3)
Carbon Dioxide, Total: 20 mmol/L (ref 20–32)
Chloride, Serum / Plasma: 108 mmol/L (ref 98–110)
Creatinine, Serum / Plasma: 1.91 mg/dL — ABNORMAL HIGH (ref 0.60–1.29)
Glucose: 103 mg/dL — ABNORMAL HIGH (ref 65–99)
Potassium, Serum / Plasma: 3.7 mmol/L (ref 3.5–5.3)
Sodium, Serum / Plasma: 139 mmol/L (ref 135–146)
Urea Nitrogen, Serum / Plasma: 26 mg/dL — ABNORMAL HIGH (ref 7–25)
eGFRcr: 43 mL/min/{1.73_m2} — ABNORMAL LOW (ref >=60)

## 2022-02-14 LAB — PHOSPHORUS, SERUM / PLASMA: Phosphorus, Serum / Plasma: 3.9 mg/dL (ref 2.5–4.5)

## 2022-02-14 LAB — TACROLIMUS, HIGHLY SENSITIVE, LC/MS/MS: Tacrolimus: 5.2 mcg/L

## 2022-02-15 MED ORDER — TACROLIMUS 5 MG CAPSULE, IMMEDIATE-RELEASE
5 | ORAL_CAPSULE | Freq: Two times a day (BID) | ORAL | 11 refills | 30.00000 days | Status: DC
Start: 2022-02-15 — End: 2022-02-16

## 2022-02-15 MED ORDER — TACROLIMUS 1 MG CAPSULE, IMMEDIATE-RELEASE
1 mg | ORAL_CAPSULE | Freq: Every day | ORAL | 11 refills | Status: DC
Start: 2022-02-15 — End: 2022-02-16

## 2022-02-15 NOTE — Telephone Encounter (Signed)
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   CPRA: 10%       Next KTU appt: 05/21/2022 POST TRANSPLANT MD     IS Meds:  Tac 5/6, goal 6-8, MMF 500mg  BID (leucopenia), Pred 5 mg/day     Issue(s):  02/13/22 cr 1.91    Cr trend:   10/23/21 08:50 Creatinine: 1.61 (H)  11/29/21 09:58 Creatinine: 1.54 (H)  01/17/22 09:05 Creatinine: 1.92 (H)  Tacrolimus level: 5.2  New medication including OTC/herbals: No  Illnesses, fevers, allograft pain: No  Hydration: ~2L of water/day  Proteinuria: 364, 08/25/20  Rejection: Per KTU note: Results of allograft biopsy on 12/24/2019  showed no rejection, DSA neg.      RN action: Sent to Dr. 02/24/2020 for review.

## 2022-02-15 NOTE — Telephone Encounter (Addendum)
-------------------------  UPDATE/ADDENDUM---------------------------  Called patient, no answer. PHI-sensitive message left with office number, requesting call back to discuss above issue(s) MC msg also sent.     Medication/labs pended for Dr. Daryll Drown signature.    -----------------------------------------------------------------------------------   ----- Message from Sandi Carne, MD sent at 02/15/2022 12:32 PM PDT -----  Increase tac to 6 mg bid, send BK virus PCR , DSA, repeat BMP next week    ----- Message -----  From: Myrlene Broker, RN  Sent: 02/15/2022  10:03 AM PDT  To: Sandi Carne, MD    Hello Dr., please review and advise w/ plan-of-care.     Question(s):  How do you wish to manage cr level?      Tx, [place]  -----------------------------------------------------      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   CPRA: 10%       Next KTU appt: 05/21/2022 POST TRANSPLANT MD     IS Meds:  Tac 5/6, goal 6-8, MMF 500mg  BID (leucopenia), Pred 5 mg/day     Issue(s):  02/13/22 cr 1.91    Cr trend:   10/23/21 08:50 Creatinine: 1.61 (H)  11/29/21 09:58 Creatinine: 1.54 (H)  01/17/22 09:05 Creatinine: 1.92 (H)  Tacrolimus level: 5.2  New medication including OTC/herbals: No  Illnesses, fevers, allograft pain: No  Hydration: ~2L of water/day  Proteinuria: 364, 08/25/20  Rejection: Per KTU note: Results of allograft biopsy on 12/24/2019  showed no rejection, DSA neg.

## 2022-02-16 MED ORDER — TACROLIMUS 1 MG CAPSULE, IMMEDIATE-RELEASE
1 mg | ORAL_CAPSULE | Freq: Two times a day (BID) | ORAL | 11 refills | Status: DC
Start: 2022-02-16 — End: 2023-03-02

## 2022-02-16 MED ORDER — TACROLIMUS 5 MG CAPSULE, IMMEDIATE-RELEASE
5 | ORAL_CAPSULE | Freq: Two times a day (BID) | ORAL | 11 refills | 30.00000 days | Status: DC
Start: 2022-02-16 — End: 2023-03-02

## 2022-03-07 LAB — ALLOMAP/ALLOSURE - EXTERNAL: Allosure Test - External: 0.06

## 2022-03-07 NOTE — Progress Notes (Signed)
Labs entered by Kayla A Martinez

## 2022-04-11 NOTE — Telephone Encounter (Signed)
Patient take Tacrolimus 1006pm/ labs drawn today at 920a. Not a true trough.

## 2022-04-12 LAB — COMPLETE BLOOD COUNT WITH DIFFERENTIAL
% Basophils: 0.9 %
% Eosinophils: 2.6 %
% Lymphocytes: 30.5 %
% Monocytes: 12.5 %
% Neutrophils: 53.5 %
Abs Basophils (cells/uL): 31 cells/uL (ref 0–200)
Abs Eosinophils (cells/uL): 88 cells/uL (ref 15–500)
Abs Lymphocytes: 1037 cells/uL (ref 850–3900)
Abs Monocytes (cells/uL): 425 cells/uL (ref 200–950)
Abs Neutrophils (cells/uL): 1819 cells/uL (ref 1500–7800)
Hematocrit: 50 % (ref 38.5–50.0)
Hemoglobin: 16.6 g/dL (ref 13.2–17.1)
MCH: 26.4 pg — ABNORMAL LOW (ref 27.0–33.0)
MCHC: 33.2 g/dL (ref 32.0–36.0)
MCV: 79.6 fL — ABNORMAL LOW (ref 80.0–100.0)
MPV: 11 fL (ref 7.5–12.5)
Platelet Count: 284 10*3/uL (ref 140–400)
RBC Count: 6.28 10*6/uL — ABNORMAL HIGH (ref 4.20–5.80)
RDW-CV: 15.1 % — ABNORMAL HIGH (ref 11.0–15.0)
WBC Count: 3.4 10*3/uL — ABNORMAL LOW (ref 3.8–10.8)

## 2022-04-12 LAB — MAGNESIUM, SERUM / PLASMA: Magnesium, Serum / Plasma: 1.9 mg/dL (ref 1.5–2.5)

## 2022-04-12 LAB — BASIC METABOLIC PANEL (NA, K, CL, CO2, BUN, CR, GLU, CA)
BUN/Creatinine Ratio: 10 (calc) (ref 6–22)
Calcium, total, Serum / Plasma: 10.2 mg/dL (ref 8.6–10.3)
Carbon Dioxide, Total: 22 mmol/L (ref 20–32)
Chloride, Serum / Plasma: 105 mmol/L (ref 98–110)
Creatinine, Serum / Plasma: 1.63 mg/dL — ABNORMAL HIGH (ref 0.60–1.29)
Glucose: 104 mg/dL — ABNORMAL HIGH (ref 65–99)
Potassium, Serum / Plasma: 4.7 mmol/L (ref 3.5–5.3)
Sodium, Serum / Plasma: 140 mmol/L (ref 135–146)
Urea Nitrogen, Serum / Plasma: 17 mg/dL (ref 7–25)
eGFRcr: 52 mL/min/{1.73_m2} — ABNORMAL LOW (ref >=60)

## 2022-04-12 LAB — TACROLIMUS, HIGHLY SENSITIVE, LC/MS/MS: Tacrolimus: 8.4 mcg/L

## 2022-04-12 LAB — PHOSPHORUS, SERUM / PLASMA: Phosphorus, Serum / Plasma: 3.6 mg/dL (ref 2.5–4.5)

## 2022-05-14 NOTE — Telephone Encounter (Signed)
Pt called to request rescheduling of appt. Per pt, someone turned off his phone ringer and he missed appt today. Also requested review of most recent labs which was done and pt had no further questions.     Note to Dr. Chauncy Passy, front desk.

## 2022-06-06 NOTE — Progress Notes (Signed)
Labs entered by Pamela D Leyva

## 2022-06-06 NOTE — Result Quicknote (Signed)
Hello Dr brar,     [facility] allosure results.     Thanks

## 2022-06-07 LAB — CAREDX HL (PHOENIX RESULT TEMPLATE) - EXTERNAL: Allosure Test - External: 0.18

## 2022-06-15 LAB — COMPLETE BLOOD COUNT WITH DIFFERENTIAL
% Basophils: 0.6 %
% Eosinophils: 0.9 %
% Lymphocytes: 18.6 %
% Monocytes: 11.6 %
% Neutrophils: 68.3 %
Abs Basophils (cells/uL): 28 cells/uL (ref 0–200)
Abs Eosinophils (cells/uL): 42 cells/uL (ref 15–500)
Abs Lymphocytes: 874 cells/uL (ref 850–3900)
Abs Monocytes (cells/uL): 545 cells/uL (ref 200–950)
Abs Neutrophils (cells/uL): 3210 cells/uL (ref 1500–7800)
Hematocrit: 50.5 % — ABNORMAL HIGH (ref 38.5–50.0)
Hemoglobin: 16.5 g/dL (ref 13.2–17.1)
MCH: 26.7 pg — ABNORMAL LOW (ref 27.0–33.0)
MCHC: 32.7 g/dL (ref 32.0–36.0)
MCV: 81.7 fL (ref 80.0–100.0)
MPV: 10.9 fL (ref 7.5–12.5)
Platelet Count: 319 10*3/uL (ref 140–400)
RBC Count: 6.18 10*6/uL — ABNORMAL HIGH (ref 4.20–5.80)
RDW-CV: 14.6 % (ref 11.0–15.0)
WBC Count: 4.7 10*3/uL (ref 3.8–10.8)

## 2022-06-15 LAB — BASIC METABOLIC PANEL (NA, K, CL, CO2, BUN, CR, GLU, CA)
BUN/Creatinine Ratio: 15 (calc) (ref 6–22)
Calcium, total, Serum / Plasma: 10 mg/dL (ref 8.6–10.3)
Carbon Dioxide, Total: 19 mmol/L — ABNORMAL LOW (ref 20–32)
Chloride, Serum / Plasma: 105 mmol/L (ref 98–110)
Creatinine, Serum / Plasma: 1.56 mg/dL — ABNORMAL HIGH (ref 0.60–1.29)
Glucose: 111 mg/dL — ABNORMAL HIGH (ref 65–99)
Potassium, Serum / Plasma: 4.7 mmol/L (ref 3.5–5.3)
Sodium, Serum / Plasma: 135 mmol/L (ref 135–146)
Urea Nitrogen, Serum / Plasma: 24 mg/dL (ref 7–25)
eGFRcr: 55 mL/min/{1.73_m2} — ABNORMAL LOW (ref >=60)

## 2022-06-15 LAB — MAGNESIUM, SERUM / PLASMA: Magnesium, Serum / Plasma: 2 mg/dL (ref 1.5–2.5)

## 2022-06-15 LAB — PHOSPHORUS, SERUM / PLASMA: Phosphorus, Serum / Plasma: 3.6 mg/dL (ref 2.5–4.5)

## 2022-06-15 LAB — TACROLIMUS, HIGHLY SENSITIVE, LC/MS/MS: Tacrolimus: 8.6 mcg/L

## 2022-06-22 NOTE — Telephone Encounter (Signed)
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   CPRA: 10%       Next KTU appt: 07/16/2022 Erline Levine, MD     IS Meds:  Tac 6/6 goal 6-8 MMF 500 BID Pred 5       ISSUE(s):    Latest Reference Range & Units 06/13/22 09:06   Tacrolimus mcg/L 8.6     Prev levels:     Latest Reference Range & Units 04/11/22 09:22   Tacrolimus mcg/L 8.4      Latest Reference Range & Units 02/13/22 08:52   Tacrolimus mcg/L 5.2     Per patient this is 11 hr trough. Patient will repeat labs with accurate 12 hr trough

## 2022-07-09 NOTE — Telephone Encounter (Signed)
FYI -       Patient received a call about an hour ago, not sure who called, I sent the call over to [facility].

## 2022-07-16 ENCOUNTER — Telehealth: Admit: 2022-07-17 | Payer: MEDICARE | Attending: Nephrology | Primary: Physician

## 2022-07-16 DIAGNOSIS — Z940 Kidney transplant status: Secondary | ICD-10-CM

## 2022-07-16 NOTE — Assessment & Plan Note (Signed)
BP controlled on current regimen.

## 2022-07-16 NOTE — Assessment & Plan Note (Signed)
He is taking tylenol prn  He has follow up this next month  with orthopedics to discuss surgical options

## 2022-07-16 NOTE — Assessment & Plan Note (Signed)
cPRA 10%.  Tacrolimus 6 mg in am and 6 mg in pm with goal trough 6-8ng/ml.  MMF 500 mg twice a day.  Continue prednisone 5 mg daily.

## 2022-07-16 NOTE — Assessment & Plan Note (Signed)
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  Repeat UA, urine protein , urine creatinine with next labs.UA not done with last labs.

## 2022-07-16 NOTE — Assessment & Plan Note (Addendum)
Hgb 16.5 g/dl   Continue Losartan 50 mg daily.  CT showed no renal lesions.  Will need phlebotomy for Hct > 55%

## 2022-07-16 NOTE — Progress Notes (Signed)
POST-KIDNEY TRANSPLANT NEPHROLOGY PROGRESS NOTE         Subjective      Chief Complaint   Patient presents with    Kidney Transplant Follow-up       History of Present Illness:  [name] is a 47 y.o. year old male S/P kidney transplant on 05/20/2019, being seen for a follow-up visit. The patient has the following complaints: He has knee pain and is going to be evaluated by orthopedics for knee replacement.   He also complains of ED and will get referral from PCP for urology.      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 Acute tubular epithelia injury; see comment.   2. No evidence of rejection, negative C4d stain.   2. No significant tubulointerstitial chronicity.   3. Moderate arteriosclerosis, donor-derived.      Special Stain Summary: (x8) PAS and trichrome special stains were  performed and examined to confirm the diagnosis. The findings are  consistent with our H&E-based light microscopic impression. Six  additional PAS-stained level sections were evaluated, and no segmentally  sclerotic glomeruli are identified.     DSAs not done      Immunosuppressive  management encounter following kidney transplant 05/21/2019     Induction agent/dose: Thymo lite  Regime: Tac/MMF  Corticosteroid plan: maintenance   cPRA: 10%      Transplant follow-up 05/21/2019     Category Problem Type of follow-up needed in clinic? Ordered? Scheduled? Comments   Nursing (e.g., drain/ Foley) HD tunneled line refer to IR for removal when appropriate Needs to be done in clinic weekly dressing changes in HD clinic until can be removed   Lab/ Micro Standard Tac Follow up results   Needs to be ordered None   Imaging studies/ procedures NA NA NA None   Referrals Uroglogy for stent removal NA Already ordered None   Delayed graft function and improving UOP, monitor for resolution        Ureteral stent retained of kidney transplant (CMS code) 05/21/2019     Implant Name Type Inv. Item Serial No. Manufacturer Lot No. LRB No. Used Action   STENT URETHRAL 6FR X 16CM DOUBLE J   [id number] - [id number] Stents-Non Des STENT URETHRAL 6FR X 16CM DOUBLE J   [id number]   [id number] Right 1 Implanted     Referral placed for outpatient stent removal    07/01/19 stent removed.       At risk for opportunistic infections 05/21/2019     Transplant Serology Workup:  Recipient        Lab Results   Component Value Date     HBCAB NEG 12/17/2018     HBSAG NEG 12/17/2018     HBABQ >800 12/17/2018     HCV NEG 12/17/2018     RPR Nonreactive 12/17/2018     CMVAB POS (A) 12/17/2018     VCAM NEG 12/17/2018     EBNA POS (A) 12/17/2018     TOXO NEG 12/17/2018            Donor        Cadaver Donor UNOS ID (no units)   Date Value   05/20/2019 [id number]      Match ID: [id number]            Induction Plan:   Thymo + steroid maintenance      Plan for PPX:   Toxo prophylaxis: (D +/R - Toxo MISMATCH), Septra DS 1 tab PO Daily x 1 month (06/19/2019), then qMWF x 11 months more (05/19/2020) -- 1 year total for Toxo Mismatch   PCP prophylaxis: Septra DS as per above for Toxo ppx   CMV prophylaxis: (D +/R +), Valcyte 900 mg PO Daily (adjusted for renal function)  x 3 months (08/18/2019)   Fungal prophylaxis: Fluconazole 100 mg PO q7days x 1 month (06/18/2019)  HBV prophylaxis: Not necessary            At risk of infection transmitted from donor 05/21/2019     Patient underwent transplant with no known donor derived risk factors: Donor is not CDC high risk, not HBV core ab + and there are no known donor derived infections. No need for treatment or monitoring beyond the usual opportunistic infection prophylaxis per protocol. Donor with no known malignancies.  The donor's CMV status is IgG positive and the recipient's CMV status is IgG positive.  Prophylaxis will be determined by risk of CMV transmission.                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     Dialysis Type: HD Hemodialysis  D[facility] Center: [facility]         Phone: [phone]       Fax: [phone]       Address: 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  Been on HD since 2010.  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        Current Outpatient Medications on File Prior to Visit   Medication Sig Dispense Refill    acetaminophen (TYLENOL) 500 mg tablet Take 2 tablets (1,000 mg total) by mouth every 8 (eight) hours as needed for Pain (mild pain) Max = 3000 mg/day 50 tablet 0    albuterol 90 mcg/actuation metered dose inhaler Inhale 1-2 puffs into the lungs every 6 (six) hours as needed for Wheezing  or Shortness of Breath         amLODIPine (NORVASC) 10 mg tablet Take 1  tablet (10 mg total) by mouth daily For additional refills please contact your family doctor. 90 tablet 0    docusate sodium (COLACE) 250 mg capsule Take 1 capsule (250 mg total) by mouth Twice a day Hold for loose stools 100 capsule 0    furosemide (LASIX) 20 mg tablet Take 20 mg by mouth daily      losartan (COZAAR) 25 mg tablet Take 2 tablets (50 mg total) by mouth daily Additional refills please contact Dr. Lonzo Candy 180 tablet 0    mycophenolate (CELLCEPT) 250 mg capsule 03/14/21: Take 2 pill(s) (500 mg) in the AM and 2 pill(s) (500 mg) in the PM,"or as directed" 120 capsule 11    omeprazole (PRILOSEC) 20 mg capsule Take 20 mg by mouth in the morning and at bedtime      oxyCODONE (ROXICODONE) 5 mg tablet Take 1 tablet (5 mg total) by mouth every 6 (six) hours as needed (severe pain) (Patient taking differently: Take 10 mg by mouth every 6 (six) hours as needed (severe pain)) 8 tablet 0    oxyCODONE-acetaminophen (PERCOCET) 10-325 mg tablet Take 1 tablet by mouth every 8 (eight) hours as needed for Pain (Lower back pain)         potassium chloride (KDUR; KLOR-CON) 20 mEq tablet Take 2 pills (40 mEq) daily for 2 weeks, "or as directed" 28 tablet 0    predniSONE (DELTASONE) 5 mg tablet Take 1 tablet (5 mg total) by mouth daily . 30 tablet 11    senna (SENOKOT) 8.6 mg tablet Take 2 tablets (17.2 mg total) by mouth nightly as needed for Constipation 100 tablet 0    tacrolimus (PROGRAF) 1 mg capsule Take 1 capsule (1 mg total) by mouth 2 (two) times daily Using both 5 mg and 1 mg .Take  6 mg am and 6 mg pm. 60 capsule 11    tacrolimus (PROGRAF) 5 mg capsule Take 1 capsule (5 mg total) by mouth 2 (two) times daily Using both 5 mg and 1 mg . Take 6 mg am and 6 mg pm. 60 capsule 11     Current Facility-Administered Medications on File Prior to Visit   Medication Dose Route Frequency Provider Last Rate Last Admin    0.9% sodium chloride bladder irrigation solution  0-3,000 mL Irrigation Once Gerrianne Scale, MD        lidocaine (XYLOCAINE URO-JET) 2 % jelly 10 mL  10 mL Intra-urethral Once Gerrianne Scale, MD        nitrofurantoin (macrocrystal-monohydrate) (MACROBID) 100 mg capsule 100 mg  100 mg Oral Once Gerrianne Scale, MD                   Objective          Phone visit       Review of Prior Testing  I personally reviewed data as noted below including the following:    Metabolic panel  Complete blood count  Urine chemistry including level of proteinuria  Level of immunosuppressive drug (tacrolimus).          Lab Results   Component Value Date    Creatinine 1.51 (H) 12/24/2019    Creatinine 2.12 (H) 06/15/2019    Creatinine 2.52 (H) 06/09/2019    Creatinine, Serum / Plasma 1.56 (H) 06/13/2022    Creatinine, Serum / Plasma 1.63 (H) 04/11/2022  Creatinine, Serum / Plasma 1.91 (H) 02/13/2022    Urea Nitrogen, Serum / Plasma 24 06/13/2022    Urea Nitrogen, Serum / Plasma 17 04/11/2022    Urea Nitrogen, Serum / Plasma 26 (H) 02/13/2022    Sodium, Serum / Plasma 135 06/13/2022    Sodium, Serum / Plasma 140 04/11/2022    Sodium, Serum / Plasma 139 02/13/2022    Potassium, Serum / Plasma 4.7 06/13/2022    Potassium, Serum / Plasma 4.7 04/11/2022    Potassium, Serum / Plasma 3.7 02/13/2022    Carbon Dioxide, Total 19 (L) 06/13/2022    Carbon Dioxide, Total 22 04/11/2022    Carbon Dioxide, Total 20 02/13/2022    Calcium, total, Serum / Plasma 10.0 06/13/2022    Calcium, total, Serum / Plasma 10.2 04/11/2022    Calcium, total, Serum / Plasma 9.7 02/13/2022    Phosphorus, Serum / Plasma 3.6 06/13/2022    Phosphorus, Serum / Plasma 3.6 04/11/2022    Phosphorus, Serum / Plasma 3.9 02/13/2022     Lab Results   Component Value Date    WBC Count 4.7 06/13/2022    WBC Count 3.4 (L) 04/11/2022    WBC Count 4.5 02/13/2022    Hemoglobin 16.5 06/13/2022    Hemoglobin 16.6 04/11/2022    Hemoglobin 16.0 02/13/2022    Hematocrit 50.5 (H) 06/13/2022    Hematocrit 50.0 04/11/2022    Hematocrit 47.9 02/13/2022    MCV 81.7  06/13/2022    MCV 79.6 (L) 04/11/2022    MCV 79.2 (L) 02/13/2022    Platelet Count 319 06/13/2022    Platelet Count 284 04/11/2022    Platelet Count 251 02/13/2022       Lab Results   Component Value Date    Tacrolimus 8.6 06/13/2022    Tacrolimus 8.4 04/11/2022    Tacrolimus 5.2 02/13/2022    Tacrolimus 5.9 06/15/2019    Tacrolimus 5.2 06/09/2019    Tacrolimus 5.0 06/04/2019          Assessment and Plan    Status post deceased-donor kidney transplantation    S/p DDRT 05/20/2019; 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  Repeat UA, urine protein , urine creatinine with next labs.UA not done with last labs.    Immunosuppressive management encounter following kidney transplant    cPRA 10%.  Tacrolimus 6 mg in am and 6 mg in pm with goal trough 6-8ng/ml.  MMF 500 mg twice a day.  Continue prednisone 5 mg daily.         HTN (hypertension)    BP controlled on current regimen.       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  CT showed no renal lesions.  Will need phlebotomy for Hct > 55%    OA (osteoarthritis) of knee  He is taking tylenol prn  He has follow up this next month  with orthopedics to discuss surgical options     Visit Diagnoses and Associated Orders Placed:  1. Status post deceased-donor kidney transplantation  Urinalysis Complete, w/ Reflex to Culture (External Lab)    Protein, Total, random urine    Creatinine, random urine    Comprehensive Metabolic Panel (BMP, AST, ALT, T.BILI, ALKP, TP, ALB)    Tacrolimus, Highly Sensitive, LC/MS/MS [facility] (External Lab)    Complete Blood Count with Differential    Urinalysis Complete, w/ Reflex to Culture (External  Lab)    Protein, Total, random urine    Creatinine, random urine    Comprehensive Metabolic Panel (BMP, AST, ALT, T.BILI, ALKP, TP, ALB)    Tacrolimus, Highly Sensitive, LC/MS/MS [facility] (External Lab)    Complete Blood Count with  Differential      2. Immunosuppressive management encounter following kidney transplant        3. Primary hypertension        4. PTE (post-transplant erythrocytosis)        5. Osteoarthritis of both knees, unspecified osteoarthritis type           No orders of the defined types were placed in this encounter.      Nutritional assessment: Patient appears well developed and well-nourished. No need for a dietary consult at this time   Pharmacy assessment: Current medications reviewed and discussed with patient. No need for a pharmacy consult at this time.    Continue to monitor kidney function, CBC, chem-7, drug levels every 2-3 months .   Monitor liver function, lipid panel, hemoglobin A1C, intact PTH, urine pr/cr ratio every 3-6 months.       Health Care Maintenance:  Recommend usual age- and sex-appropriate cancer screening plus annual dermatology visit for skin cancer surveillance  Recommend age and disease-appropriate vaccinations (killed vaccines only) including annual influenza vaccine. Live vaccines are contraindicated.     Risk Assessment: The patient is at increased risk of complications including drug toxicity, infection and malignancy as a result of immunosuppression for his kidney transplant and requires close monitoring of immunosuppression levels and dose adjustment to manage toxicities.   There is no evidence of immunosuppressive drug toxicity at this visit.                        The patient initiated this service. This service is in place of a face to face visit. The patient consents to conduct this visit by telephone because the tools necessary to participate in a video visit were not available at the time of this visit or the patient preferred a telephone visit. I spent a total of 25 minutes in communication with this patient.

## 2022-07-16 NOTE — Assessment & Plan Note (Deleted)
He is taking tylenol prn  He has follow up this next month  with orthopedics to discuss surgical options

## 2022-07-16 NOTE — Progress Notes (Signed)
Labs renewed @[facility]  monthly & quarterly EXP 01-14-23

## 2022-07-18 LAB — COMPLETE BLOOD COUNT WITH DIFFERENTIAL
% Basophils: 0.3 %
% Eosinophils: 0.7 %
% Lymphocytes: 9.3 %
% Monocytes: 8.2 %
% Neutrophils: 81.5 %
Abs Basophils (cells/uL): 22 cells/uL (ref 0–200)
Abs Eosinophils (cells/uL): 52 cells/uL (ref 15–500)
Abs Lymphocytes: 688 cells/uL — ABNORMAL LOW (ref 850–3900)
Abs Monocytes (cells/uL): 607 cells/uL (ref 200–950)
Abs Neutrophils (cells/uL): 6031 cells/uL (ref 1500–7800)
Hematocrit: 47.9 % (ref 38.5–50.0)
Hemoglobin: 15.4 g/dL (ref 13.2–17.1)
MCH: 26.5 pg — ABNORMAL LOW (ref 27.0–33.0)
MCHC: 32.2 g/dL (ref 32.0–36.0)
MCV: 82.3 fL (ref 80.0–100.0)
MPV: 11 fL (ref 7.5–12.5)
Platelet Count: 280 10*3/uL (ref 140–400)
RBC Count: 5.82 10*6/uL — ABNORMAL HIGH (ref 4.20–5.80)
RDW-CV: 14.2 % (ref 11.0–15.0)
WBC Count: 7.4 10*3/uL (ref 3.8–10.8)

## 2022-07-18 LAB — BASIC METABOLIC PANEL (NA, K, CL, CO2, BUN, CR, GLU, CA)
BUN/Creatinine Ratio: 11 (calc) (ref 6–22)
Calcium, total, Serum / Plasma: 9.8 mg/dL (ref 8.6–10.3)
Carbon Dioxide, Total: 23 mmol/L (ref 20–32)
Chloride, Serum / Plasma: 101 mmol/L (ref 98–110)
Creatinine, Serum / Plasma: 1.45 mg/dL — ABNORMAL HIGH (ref 0.60–1.29)
Glucose: 104 mg/dL — ABNORMAL HIGH (ref 65–99)
Potassium, Serum / Plasma: 3.9 mmol/L (ref 3.5–5.3)
Sodium, Serum / Plasma: 137 mmol/L (ref 135–146)
Urea Nitrogen, Serum / Plasma: 16 mg/dL (ref 7–25)
eGFRcr: 60 mL/min/{1.73_m2} (ref >=60)

## 2022-07-18 LAB — PHOSPHORUS, SERUM / PLASMA: Phosphorus, Serum / Plasma: 3.3 mg/dL (ref 2.5–4.5)

## 2022-07-18 LAB — TACROLIMUS, HIGHLY SENSITIVE, LC/MS/MS: Tacrolimus: 6.2 mcg/L

## 2022-07-18 LAB — MAGNESIUM, SERUM / PLASMA: Magnesium, Serum / Plasma: 1.6 mg/dL (ref 1.5–2.5)

## 2022-10-25 ENCOUNTER — Telehealth: Admit: 2022-10-26 | Payer: MEDICARE | Attending: Nephrology | Primary: Physician

## 2022-10-25 DIAGNOSIS — Z940 Kidney transplant status: Secondary | ICD-10-CM

## 2022-10-25 MED ORDER — LORATADINE 10 MG DISINTEGRATING TABLET
10 | Freq: Every day | ORAL | Status: AC
Start: 2022-10-25 — End: ?

## 2022-10-25 MED ORDER — CLONIDINE HCL 0.1 MG TABLET
0.1 | Freq: Two times a day (BID) | ORAL | Status: DC
Start: 2022-10-25 — End: 2022-10-25

## 2022-10-25 MED ORDER — CLONIDINE HCL 0.1 MG TABLET
0.1 | Freq: Every day | ORAL | Status: AC
Start: 2022-10-25 — End: ?

## 2022-10-25 MED ORDER — ERGOCALCIFEROL (VITAMIN D2) 1,250 MCG (50,000 UNIT) CAPSULE
50000 | ORAL | Status: AC
Start: 2022-10-25 — End: ?

## 2022-10-25 MED ORDER — ATORVASTATIN 10 MG TABLET
10 | Freq: Every day | ORAL | Status: AC
Start: 2022-10-25 — End: ?

## 2022-10-25 NOTE — Assessment & Plan Note (Signed)
BP controlled on current regimen.

## 2022-10-25 NOTE — Assessment & Plan Note (Signed)
cPRA 10%.  Tacrolimus 6 mg in am and 6 mg in pm with goal trough 5-7 ng/ml.  Advised to repeat 12 hr tacrolimus levels  MMF 500 mg twice a day.  Continue prednisone 5 mg daily.

## 2022-10-25 NOTE — Progress Notes (Signed)
POST-KIDNEY TRANSPLANT NEPHROLOGY PROGRESS NOTE         Subjective      Chief Complaint   Patient presents with    Kidney Transplant Follow-up       History of Present Illness:  [name] is a 47 y.o. year old male S/P kidney transplant on 05/20/2019, being seen for a follow-up visit.     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 Acute tubular epithelia injury; see comment.   2. No evidence of rejection, negative C4d stain.   2. No significant tubulointerstitial chronicity.   3. Moderate arteriosclerosis, donor-derived.      Special Stain Summary: (x8) PAS and trichrome special stains were  performed and examined to confirm the diagnosis. The findings are  consistent with our H&E-based light microscopic impression. Six  additional PAS-stained level sections were evaluated, and no segmentally  sclerotic glomeruli are identified.     DSAs not done      Immunosuppressive management encounter following kidney transplant 05/21/2019      Induction agent/dose: Thymo lite  Regime: Tac/MMF  Corticosteroid plan: maintenance   cPRA: 10%    Results of allograft biopsy on 12/24/2019  showed no rejection, DSA neg.      Transplant follow-up 05/21/2019     Category Problem Type of follow-up needed in clinic? Ordered? Scheduled? Comments   Nursing (e.g., drain/ Foley) HD tunneled line refer to IR for removal when appropriate Needs to be done in clinic weekly dressing changes in HD clinic until can be removed   Lab/ Micro Standard Tac Follow up results   Needs to be ordered None   Imaging studies/ procedures NA NA NA None   Referrals Uroglogy for stent removal NA Already ordered None   Delayed graft function and improving UOP, monitor for resolution        Ureteral stent retained of kidney transplant (CMS code) 05/21/2019     Implant Name Type Inv. Item Serial No. Manufacturer Lot No. LRB No. Used Action   STENT URETHRAL 6FR X 16CM DOUBLE J   [id number] - [id number] Stents-Non Des STENT URETHRAL 6FR X 16CM DOUBLE J   [id number]   [id number] Right 1 Implanted     Referral placed for outpatient stent removal    07/01/19 stent removed.       At risk for opportunistic infections 05/21/2019  Transplant Serology Workup:  Recipient        Lab Results   Component Value Date     HBCAB NEG 12/17/2018     HBSAG NEG 12/17/2018     HBABQ >800 12/17/2018     HCV NEG 12/17/2018     RPR Nonreactive 12/17/2018     CMVAB POS (A) 12/17/2018     VCAM NEG 12/17/2018     EBNA POS (A) 12/17/2018     TOXO NEG 12/17/2018            Donor        Cadaver Donor UNOS ID (no units)   Date Value   05/20/2019 [id number]      Match ID: [id number]            Induction Plan:   Thymo + steroid maintenance      Plan for PPX:   Toxo prophylaxis: (D +/R - Toxo MISMATCH), Septra DS 1 tab PO Daily x 1 month (06/19/2019), then qMWF x 11 months more (05/19/2020) -- 1 year total for Toxo Mismatch   PCP prophylaxis: Septra DS as per above for Toxo ppx   CMV prophylaxis: (D +/R +), Valcyte 900 mg PO Daily (adjusted for  renal function) x 3 months (08/18/2019)   Fungal prophylaxis: Fluconazole 100 mg PO q7days x 1 month (06/18/2019)  HBV prophylaxis: Not necessary            At risk of infection transmitted from donor 05/21/2019     Patient underwent transplant with no known donor derived risk factors: Donor is not CDC high risk, not HBV core ab + and there are no known donor derived infections. No need for treatment or monitoring beyond the usual opportunistic infection prophylaxis per protocol. Donor with no known malignancies.  The donor's CMV status is IgG positive and the recipient's CMV status is IgG positive.  Prophylaxis will be determined by risk of CMV transmission.                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     Dialysis Type: HD Hemodialysis  Dialysis Center: [facility]         Phone: [phone]       Fax: [phone]       Address: 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  Been on HD since 2010.  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   Medication Sig    amLODIPine (NORVASC) 10 mg tablet Take 1 tablet (10 mg total) by mouth daily For additional refills please contact your family doctor.    atorvastatin (LIPITOR) 10 mg tablet Take 1 tablet (10 mg total) by mouth daily    ergocalciferol, vitamin D2, (VITAMIN D2) 50,000 unit  capsule Take 1 capsule (50,000 Units total) by mouth every 7 (seven)  days Use as instructed    furosemide (LASIX) 20 mg tablet Take 20 mg by mouth daily    loratadine (CLARITIN REDITABS) 10 mg rapid dissolve tablet Dissolve 1 tablet (10 mg total) in the mouth daily    losartan (COZAAR) 25 mg tablet Take 2 tablets (50 mg total) by mouth daily Additional refills please contact Dr. Lonzo Candy    mycophenolate (CELLCEPT) 250 mg capsule 03/14/21: Take 2 pill(s) (500 mg) in the AM and 2 pill(s) (500 mg) in the PM,"or as directed"    potassium chloride (KDUR; KLOR-CON) 20 mEq tablet Take 2 pills (40 mEq) daily for 2 weeks, "or as directed"    predniSONE (DELTASONE) 5 mg tablet Take 1 tablet (5 mg total) by mouth daily .    tacrolimus (PROGRAF) 1 mg capsule Take 1 capsule (1 mg total) by mouth 2 (two) times daily Using both 5 mg and 1 mg .Take  6 mg am and 6 mg pm.    tacrolimus (PROGRAF) 5 mg capsule Take 1 capsule (5 mg total) by mouth 2 (two) times daily Using both 5 mg and 1 mg . Take 6 mg am and 6 mg pm.    [DISCONTINUED] cloNIDine HCL (CATAPRES) 0.1 mg tablet Take 1 tablet (0.1 mg total) by mouth in the morning and 1 tablet (0.1 mg total) in the evening.             Objective      phone visit     Review of Prior Testing  I personally reviewed data as noted below including the following:    Metabolic panel  Complete blood count  Level of immunosuppressive drug (tacrolimus).  Urine chemistry including level of proteinuria          Lab Results   Component Value Date    Creatinine 1.51 (H) 12/24/2019    Creatinine 2.12 (H) 06/15/2019    Creatinine 2.52 (H) 06/09/2019    Creatinine, Serum / Plasma 1.45 (H) 07/16/2022    Creatinine, Serum / Plasma 1.56 (H) 06/13/2022    Creatinine, Serum / Plasma 1.63 (H) 04/11/2022    Urea Nitrogen, serum/plasma 16 07/16/2022    Urea Nitrogen, serum/plasma 24 06/13/2022    Urea Nitrogen, serum/plasma 17 04/11/2022    Sodium, Serum / Plasma 137 07/16/2022    Sodium, Serum / Plasma 135 06/13/2022    Sodium, Serum / Plasma 140 04/11/2022    Potassium,  Serum / Plasma 3.9 07/16/2022    Potassium, Serum / Plasma 4.7 06/13/2022    Potassium, Serum / Plasma 4.7 04/11/2022    Carbon Dioxide, Total 23 07/16/2022    Carbon Dioxide, Total 19 (L) 06/13/2022    Carbon Dioxide, Total 22 04/11/2022    Calcium, total, Serum / Plasma 9.8 07/16/2022    Calcium, total, Serum / Plasma 10.0 06/13/2022    Calcium, total, Serum / Plasma 10.2 04/11/2022    Phosphorus, serum/plasma 3.3 07/16/2022    Phosphorus, serum/plasma 3.6 06/13/2022    Phosphorus, serum/plasma 3.6 04/11/2022     Lab Results   Component Value Date    WBC Count 7.4 07/16/2022    WBC Count 4.7 06/13/2022    WBC Count 3.4 (L) 04/11/2022    Hemoglobin 15.4 07/16/2022    Hemoglobin 16.5 06/13/2022    Hemoglobin 16.6 04/11/2022    Hematocrit 47.9 07/16/2022    Hematocrit 50.5 (H) 06/13/2022    Hematocrit 50.0 04/11/2022  MCV 82.3 07/16/2022    MCV 81.7 06/13/2022    MCV 79.6 (L) 04/11/2022    Platelet Count 280 07/16/2022    Platelet Count 319 06/13/2022    Platelet Count 284 04/11/2022       Lab Results   Component Value Date    Tacrolimus 6.2 07/16/2022    Tacrolimus 8.6 06/13/2022    Tacrolimus 8.4 04/11/2022    Tacrolimus 5.9 06/15/2019    Tacrolimus 5.2 06/09/2019    Tacrolimus 5.0 06/04/2019          Assessment and Plan    Status post deceased-donor kidney transplantation    S/p DDRT 05/20/2019; 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  Repeat UA, urine protein , urine creatinine with next labs.    Immunosuppressive management encounter following kidney transplant    cPRA 10%.  Tacrolimus 6 mg in am and 6 mg in pm with goal trough 5-7 ng/ml.  Advised to repeat 12 hr tacrolimus levels  MMF 500 mg twice a day.  Continue prednisone 5 mg daily.    HTN (hypertension)    BP controlled on current regimen.    PTE (post-transplant erythrocytosis)   Hgb 16g/dl   Continue Losartan 50 mg daily.  CT showed no renal lesions.  Will need phlebotomy for  Hct > 55%       Eye swelling, bilateral    He was seen in ER 3 days ago for b/l eye swelling. CT showed no abscess, he was started on clindamycin. He is legally blind.It was unclear if this was allergic and then he did itching on eye lids and superimposed infection.Swelling has improved    Visit Diagnoses and Associated Orders Placed:  1. Status post deceased-donor kidney transplantation        2. Immunosuppressive management encounter following kidney transplant        3. Primary hypertension        4. PTE (post-transplant erythrocytosis)        5. Eye swelling, bilateral             Nutritional assessment: Patient appears well developed and well-nourished. No need for a dietary consult at this time   Pharmacy assessment: Current medications reviewed and discussed with patient. No need for a pharmacy consult at this time.    Continue to monitor kidney function, CBC, chem-7, drug levels every 2-3 months. Will try to review results from Dr Berline Chough office  Monitor liver function, lipid panel, hemoglobin A1C, intact PTH, urine pr/cr ratio every 3-6 months.       Health Care Maintenance:  Recommend usual age- and sex-appropriate cancer screening plus annual dermatology visit for skin cancer surveillance  Recommend age and disease-appropriate vaccinations (killed vaccines only) including annual influenza vaccine. Live vaccines are contraindicated.     Risk Assessment: The patient is at increased risk of complications including drug toxicity, infection and malignancy as a result of immunosuppression for his kidney transplant and requires close monitoring of immunosuppression levels and dose adjustment to manage toxicities.   There is no evidence of immunosuppressive drug toxicity at this visit.                I spent a total of 40 minutes on this patient's care on the day of their visit excluding time spent related to any billed procedures. This time includes time spent with the patient as well as time spent documenting in  the medical record, reviewing patient's records and tests, obtaining history, placing orders, communicating with other healthcare professionals, counseling the patient, family, or caregiver, and/or care coordination for the diagnoses above.    This was a phone visit as he could not connect to zoom

## 2022-10-25 NOTE — Assessment & Plan Note (Signed)
He was seen in ER 3 days ago for b/l eye swelling. CT showed no abscess, he was started on clindamycin. He is legally blind.It was unclear if this was allergic and then he did itching on eye lids and superimposed infection.Swelling has improved

## 2022-10-25 NOTE — Assessment & Plan Note (Signed)
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  Repeat UA, urine protein , urine creatinine with next labs.

## 2022-10-25 NOTE — Assessment & Plan Note (Signed)
Hgb 16g/dl   Continue Losartan 50 mg daily.  CT showed no renal lesions.  Will need phlebotomy for Hct > 55%

## 2022-10-26 LAB — KI POST TX (PHOENIX RESULT TEMPLATE) - EXTERNAL
Abs Neutrophils (ANC) - External: 3.17
BUN - External: 15
CO2 - External: 25
Calcium - External: 9.5
Chloride, Ser/Pl - External: 107
Creatinine, Serum - External: 1.24
Glucose, Ser/Pl - External: 142
Hematocrit - External: 49
Hemoglobin - External: 16
Platelets - External: 264
Potassium - External: 3.5
Sodium - External: 139
WBC - External: 4.7

## 2023-01-31 MED ORDER — MYCOPHENOLATE MOFETIL 250 MG CAPSULE
250 | ORAL_CAPSULE | Freq: Two times a day (BID) | ORAL | 11 refills | 30.00000 days | Status: DC
Start: 2023-01-31 — End: 2023-10-17

## 2023-03-02 MED ORDER — TACROLIMUS 1 MG CAPSULE, IMMEDIATE-RELEASE
1 | ORAL_CAPSULE | Freq: Two times a day (BID) | ORAL | 11 refills | 30.00000 days | Status: DC
Start: 2023-03-02 — End: 2023-10-17

## 2023-03-02 MED ORDER — TACROLIMUS 5 MG CAPSULE, IMMEDIATE-RELEASE
5 | ORAL_CAPSULE | Freq: Two times a day (BID) | ORAL | 11 refills | 30.00000 days | Status: DC
Start: 2023-03-02 — End: 2023-10-17

## 2023-05-02 ENCOUNTER — Telehealth: Admit: 2023-05-03 | Payer: MEDICARE | Attending: Nephrology | Primary: Physician

## 2023-05-02 DIAGNOSIS — Z940 Kidney transplant status: Secondary | ICD-10-CM

## 2023-05-02 MED ORDER — MAGNESIUM OXIDE 400 MG (241.3 MG MAGNESIUM) TABLET
400 | Freq: Every day | ORAL | Status: AC
Start: 2023-05-02 — End: ?

## 2023-05-02 MED ORDER — MULTIVITAMIN WITH FOLIC ACID 400 MCG TABLET
Freq: Every day | ORAL | Status: AC
Start: 2023-05-02 — End: ?

## 2023-05-02 MED ORDER — ALLOPURINOL 300 MG TABLET
300 | Freq: Every day | ORAL | Status: AC
Start: 2023-05-02 — End: ?

## 2023-05-02 NOTE — Assessment & Plan Note (Signed)
Hgb 16g/dl   Continue Losartan 50 mg daily.  CT showed no renal lesions.  Will need phlebotomy for Hct > 55%  He follows with local hematologist for follow up and need for phlebotomy

## 2023-05-02 NOTE — Assessment & Plan Note (Addendum)
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

## 2023-05-02 NOTE — Assessment & Plan Note (Signed)
cPRA 10%.  Continue Tacrolimus 6 mg in am and 6 mg in pm with goal trough 5-7 ng/ml.Repeat tacrolimus trough level pending   Continue MMF 500 mg twice a day.  Continue prednisone 5 mg daily.

## 2023-05-02 NOTE — Assessment & Plan Note (Signed)
High potassium food advised  He is also taking potassium supplementation

## 2023-05-02 NOTE — Assessment & Plan Note (Signed)
BP controlled on current regimen.

## 2023-05-02 NOTE — Progress Notes (Signed)
POST-KIDNEY TRANSPLANT NEPHROLOGY PROGRESS NOTE         Subjective      Chief Complaint   Patient presents with    Kidney Transplant Follow-up       History of Present Illness:  Cody Myers is a 47 y.o. year old male S/P kidney transplant on 05/20/2019, being seen for a follow-up visit. The patient feels well and has no new complaints.         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 Acute tubular epithelia injury; see comment.   2. No evidence of rejection, negative C4d stain.   2. No significant tubulointerstitial chronicity.   3. Moderate arteriosclerosis, donor-derived.      Special Stain Summary: (x8) PAS and trichrome special stains were  performed and examined to confirm the diagnosis. The findings are  consistent with our H&E-based light microscopic impression. Six  additional PAS-stained level sections were evaluated, and no segmentally  sclerotic glomeruli are identified.     DSAs not done      Immunosuppressive management encounter  following kidney transplant 05/21/2019     Induction agent/dose: Thymo lite  Regime: Tac/MMF  Corticosteroid plan: maintenance   cPRA: 10%    Results of allograft biopsy on 12/24/2019  showed no rejection, DSA neg.      Transplant follow-up 05/21/2019     Category Problem Type of follow-up needed in clinic? Ordered? Scheduled? Comments   Nursing (e.g., drain/ Foley) HD tunneled line refer to IR for removal when appropriate Needs to be done in clinic weekly dressing changes in HD clinic until can be removed   Lab/ Micro Standard Tac Follow up results   Needs to be ordered None   Imaging studies/ procedures NA NA NA None   Referrals Uroglogy for stent removal NA Already ordered None   Delayed graft function and improving UOP, monitor for resolution        Ureteral stent retained of kidney transplant (CMS code) 05/21/2019     Implant Name Type Inv. Item Serial No. Manufacturer Lot No. LRB No. Used Action   STENT URETHRAL 6FR X 16CM DOUBLE J   [id number] - [id number] Stents-Non Des STENT URETHRAL 6FR X 16CM DOUBLE J   [id number]   [id number] Right 1 Implanted     Referral placed for outpatient stent removal    07/01/19 stent removed.       At risk  for opportunistic infections 05/21/2019     Transplant Serology Workup:  Recipient        Lab Results   Component Value Date     HBCAB NEG 12/17/2018     HBSAG NEG 12/17/2018     HBABQ >800 12/17/2018     HCV NEG 12/17/2018     RPR Nonreactive 12/17/2018     CMVAB POS (A) 12/17/2018     VCAM NEG 12/17/2018     EBNA POS (A) 12/17/2018     TOXO NEG 12/17/2018            Donor        Cadaver Donor UNOS ID (no units)   Date Value   05/20/2019 [id number]      Match ID: [id number]            Induction Plan:   Thymo + steroid maintenance      Plan for PPX:   Toxo prophylaxis: (D +/R - Toxo MISMATCH), Septra DS 1 tab PO Daily x 1 month (06/19/2019), then qMWF x 11 months more (05/19/2020) -- 1 year total for Toxo Mismatch   PCP prophylaxis: Septra DS as per above for Toxo ppx   CMV prophylaxis: (D +/R +),  Valcyte 900 mg PO Daily (adjusted for renal function) x 3 months (08/18/2019)   Fungal prophylaxis: Fluconazole 100 mg PO q7days x 1 month (06/18/2019)  HBV prophylaxis: Not necessary            At risk of infection transmitted from donor 05/21/2019     Patient underwent transplant with no known donor derived risk factors: Donor is not CDC high risk, not HBV core ab + and there are no known donor derived infections. No need for treatment or monitoring beyond the usual opportunistic infection prophylaxis per protocol. Donor with no known malignancies.  The donor's CMV status is IgG positive and the recipient's CMV status is IgG positive.  Prophylaxis will be determined by risk of CMV transmission.                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     Dialysis Type: HD Hemodialysis  Dialysis Center: [facility]         Phone: [phone]       Fax: [phone]       Address: 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  Been on HD since 2010.  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 90 tablet 0    atorvastatin (LIPITOR) 10 mg tablet Take 1 tablet (10 mg total) by mouth daily      cloNIDine HCL (CATAPRES) 0.1 mg tablet Take 1 tablet (0.1 mg total) by mouth daily      docusate sodium (COLACE) 250 mg capsule Take 1 capsule (250 mg total) by mouth Twice a day Hold for loose stools 100 capsule 0    ergocalciferol, vitamin D2, (VITAMIN D2) 50,000 unit capsule Take 1 capsule (50,000 Units total) by mouth every 7 (seven) days Use as instructed      furosemide (LASIX) 20 mg tablet Take 20 mg by mouth daily      loratadine (CLARITIN REDITABS) 10 mg rapid dissolve tablet Dissolve 1 tablet (10 mg total) in the mouth daily      losartan (COZAAR) 25 mg tablet Take 2 tablets (50 mg total) by mouth daily Additional refills please contact Dr. Lonzo Candy 180 tablet 0    mycophenolate (CELLCEPT) 250 mg capsule Take 2 capsules (500 mg total) by mouth in the morning and 2 capsules (500 mg total) in the evening. 120 capsule 11    omeprazole (PRILOSEC) 20 mg capsule Take 20 mg by mouth in the morning and at bedtime      oxyCODONE (ROXICODONE) 5 mg tablet Take 1 tablet (5 mg total) by mouth every 6 (six) hours as needed (severe pain) (Patient taking differently: Take 10 mg by mouth every 6 (six) hours as needed (severe pain)) 8 tablet 0    oxyCODONE-acetaminophen (PERCOCET) 10-325 mg tablet Take 1 tablet by mouth every 8 (eight) hours as needed for Pain (Lower back pain)         potassium chloride (KDUR; KLOR-CON) 20 mEq tablet Take 2 pills (40 mEq) daily for 2 weeks, "or as directed" 28 tablet 0    predniSONE (DELTASONE) 5 mg tablet Take 1 tablet (5 mg total) by mouth daily . 30 tablet 11    senna (SENOKOT) 8.6 mg tablet Take 2 tablets (17.2 mg total) by mouth nightly as needed for Constipation 100 tablet 0    tacrolimus (PROGRAF) 1 mg capsule Take 1 capsule (1  mg total) by mouth 2 (two) times daily (Take with 5 mg capsule to total 6 mg in the AM and PM) 60 capsule 11    tacrolimus (PROGRAF) 5 mg capsule Take 1 capsule (5 mg total) by mouth 2 (two) times daily (Take with 1 mg capsule to total 6 mg in the AM and PM) 60 capsule 11     Current Facility-Administered Medications on File Prior to Visit   Medication Dose Route Frequency Provider Last Rate Last Admin    0.9% sodium chloride bladder irrigation solution  0-3,000 mL Irrigation Once Gerrianne Scale, MD        lidocaine (XYLOCAINE URO-JET) 2 % jelly 10 mL  10 mL Intra-urethral Once Gerrianne Scale, MD        nitrofurantoin (macrocrystal-monohydrate) (MACROBID) 100 mg capsule 100 mg  100 mg Oral Once Gerrianne Scale, MD                   Objective          Physical Exam:   General:  Well-appearing. No acute distress.  HEENT: Normocephalic and atraumatic, legally blind   Chest: Unlabored respirations.   Skin: No obvious lesions or rash   Psychiatric: Normal mood and affect.   Neurological: Alert and oriented x 3. Tremor absent..       Review of Prior Testing  I personally reviewed data  as noted below including the following:    Metabolic panel  Complete blood count  Level of immunosuppressive drug (tacrolimus).  Urine chemistry including level of proteinuria          Lab Results   Component Value Date    Creatinine 1.51 (H) 12/24/2019    Creatinine 2.12 (H) 06/15/2019    Creatinine 2.52 (H) 06/09/2019    Creatinine, Serum / Plasma 1.45 (H) 07/16/2022    Creatinine, Serum / Plasma 1.56 (H) 06/13/2022    Creatinine, Serum / Plasma 1.63 (H) 04/11/2022    Urea Nitrogen, serum/plasma 16 07/16/2022    Urea Nitrogen, serum/plasma 24 06/13/2022    Urea Nitrogen, serum/plasma 17 04/11/2022    Sodium, Serum / Plasma 137 07/16/2022    Sodium, Serum / Plasma 135 06/13/2022    Sodium, Serum / Plasma 140 04/11/2022    Potassium, Serum / Plasma 3.9 07/16/2022    Potassium, Serum / Plasma 4.7 06/13/2022    Potassium,  Serum / Plasma 4.7 04/11/2022    Carbon Dioxide, Total 23 07/16/2022    Carbon Dioxide, Total 19 (L) 06/13/2022    Carbon Dioxide, Total 22 04/11/2022    Calcium, total, Serum / Plasma 9.8 07/16/2022    Calcium, total, Serum / Plasma 10.0 06/13/2022    Calcium, total, Serum / Plasma 10.2 04/11/2022    Phosphorus, serum/plasma 3.3 07/16/2022    Phosphorus, serum/plasma 3.6 06/13/2022    Phosphorus, serum/plasma 3.6 04/11/2022     Lab Results   Component Value Date    WBC Count 7.4 07/16/2022    WBC Count 4.7 06/13/2022    WBC Count 3.4 (L) 04/11/2022    Hemoglobin 15.4 07/16/2022    Hemoglobin 16.5 06/13/2022    Hemoglobin 16.6 04/11/2022    Hematocrit 47.9 07/16/2022    Hematocrit 50.5 (H) 06/13/2022    Hematocrit 50.0 04/11/2022    MCV 82.3 07/16/2022    MCV 81.7 06/13/2022    MCV 79.6 (L) 04/11/2022    Platelet Count 280 07/16/2022    Platelet Count 319 06/13/2022    Platelet Count 284 04/11/2022       Lab Results   Component Value Date    Tacrolimus 6.2 07/16/2022    Tacrolimus 8.6 06/13/2022    Tacrolimus 8.4 04/11/2022    Tacrolimus 5.9 06/15/2019    Tacrolimus 5.2 06/09/2019    Tacrolimus 5.0 06/04/2019          Assessment and Plan    Status post deceased-donor kidney transplantation  S/p DDRT 05/20/2019; 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  Continue Tacrolimus 6 mg in am and 6 mg in pm with goal trough 5-7 ng/ml.Repeat tacrolimus trough level pending   Continue MMF 500 mg twice a day.  Continue prednisone 5 mg daily.    HTN (hypertension)  BP controlled on current regimen.    PTE (post-transplant erythrocytosis)   Hgb 16g/dl   Continue Losartan 50 mg daily.  CT  showed no renal lesions.  Will need phlebotomy for Hct > 55%  He follows with local hematologist for follow up and need for  phlebotomy    Hypokalemia  High potassium food advised  He is also taking potassium supplementation       Visit Diagnoses and Associated Orders Placed:  1. Status post deceased-donor kidney transplantation        2. Immunosuppressive management encounter following kidney transplant        3. Primary hypertension        4. PTE (post-transplant erythrocytosis)        5. Hypokalemia             Nutritional assessment: Patient appears well developed and well-nourished. No need for a dietary consult at this time   Pharmacy assessment: Current medications reviewed and discussed with patient. No need for a pharmacy consult at this time.    Continue to monitor kidney function, CBC, chem-7, drug levels every 2-3 months.   Monitor liver function, lipid panel, hemoglobin A1C, intact PTH, urine pr/cr ratio every 3-6 months.       Health Care Maintenance:  Recommend usual age- and sex-appropriate cancer screening plus annual dermatology visit for skin cancer surveillance  Recommend age and disease-appropriate vaccinations (killed vaccines only) including annual influenza vaccine. Live vaccines are contraindicated.     Risk Assessment: The patient is at increased risk of complications including drug toxicity, infection and malignancy as a result of immunosuppression for his kidney transplant and requires close monitoring of immunosuppression levels and dose adjustment to manage toxicities.   There is no evidence of immunosuppressive drug toxicity at this visit.                  I spent a total of 40 minutes on this patient's care on the day of their visit excluding time spent related to any billed procedures. This time includes time spent with the patient as well as time spent documenting in the medical record, reviewing patient's records and tests, obtaining history, placing orders, communicating with other healthcare professionals, counseling the patient, family, or caregiver, and/or care coordination for the diagnoses  above.      I performed this evaluation using real-time telehealth tools, including a live video Zoom connection between my location and the patient's location. Prior to initiating, the patient consented to perform this evaluation using telehealth tools.

## 2023-05-03 LAB — PHOSPHORUS, SERUM / PLASMA: Phosphorus, serum/plasma: 2.7 mg/dL (ref 2.5–4.5)

## 2023-05-03 LAB — COMPREHENSIVE METABOLIC PANEL (BMP, AST, ALT, T.BILI, ALKP, TP ALB)
AST: 18 U/L (ref 10–40)
Alanine transaminase: 17 U/L (ref 9–46)
Albumin, Serum / Plasma: 4.7 g/dL (ref 3.6–5.1)
Albumin/Globulin Ratio: 2 (calc) (ref 1.0–2.5)
Alkaline Phosphatase: 92 U/L (ref 36–130)
BUN/Creatinine Ratio: 14 (calc) (ref 6–22)
Bilirubin, Total: 1 mg/dL (ref 0.2–1.2)
Calcium, total, Serum / Plasma: 10.2 mg/dL (ref 8.6–10.3)
Carbon Dioxide, Total: 23 mmol/L (ref 20–32)
Chloride, Serum / Plasma: 101 mmol/L (ref 98–110)
Creatinine, Serum / Plasma: 1.59 mg/dL — ABNORMAL HIGH (ref 0.60–1.29)
Globulin, Total: 2.4 g/dL (ref 1.9–3.7)
Glucose: 119 mg/dL — ABNORMAL HIGH (ref 65–99)
Potassium, Serum / Plasma: 3.4 mmol/L — ABNORMAL LOW (ref 3.5–5.3)
Protein, Total, Serum / Plasma: 7.1 g/dL (ref 6.1–8.1)
Sodium, Serum / Plasma: 138 mmol/L (ref 135–146)
Urea Nitrogen, serum/plasma: 22 mg/dL (ref 7–25)
eGFRcr: 54 mL/min/{1.73_m2} — ABNORMAL LOW (ref >=60)

## 2023-05-03 LAB — PROTEIN, TOTAL, RANDOM URINE
Creatinine, Random Urine: 256 mg/dL (ref 20–320)
Prot Concentration,UR: 57 mg/dL — ABNORMAL HIGH (ref 5–25)
Protein/Creat Ratio, Urine: 223 mg/g{creat} — ABNORMAL HIGH (ref 25–148)
Protein/Creatinine Ratio, Random Urine: 0.223 mg/mg{creat} — ABNORMAL HIGH (ref 0.025–0.148)

## 2023-05-03 LAB — COMPLETE BLOOD COUNT WITH DIFFERENTIAL
% Basophils: 0.5 %
% Eosinophils: 0.5 %
% Lymphocytes: 25.8 %
% Monocytes: 9.5 %
% Neutrophils: 63.7 %
Abs Basophils (cells/uL): 29 {cells}/uL (ref 0–200)
Abs Eosinophils (cells/uL): 29 {cells}/uL (ref 15–500)
Abs Lymphocytes: 1496 {cells}/uL (ref 850–3900)
Abs Monocytes (cells/uL): 551 {cells}/uL (ref 200–950)
Abs Neutrophils (cells/uL): 3695 {cells}/uL (ref 1500–7800)
Hematocrit: 51.7 % — ABNORMAL HIGH (ref 38.5–50.0)
Hemoglobin: 16.7 g/dL (ref 13.2–17.1)
MCH: 26.8 pg — ABNORMAL LOW (ref 27.0–33.0)
MCHC: 32.3 g/dL (ref 32.0–36.0)
MCV: 83 fL (ref 80.0–100.0)
MPV: 11.2 fL (ref 7.5–12.5)
Platelet Count: 275 10*3/uL (ref 140–400)
RBC Count: 6.23 10*6/uL — ABNORMAL HIGH (ref 4.20–5.80)
RDW-CV: 14.7 % (ref 11.0–15.0)
WBC Count: 5.8 10*3/uL (ref 3.8–10.8)

## 2023-05-03 LAB — LIPID PANEL (INCL. LDL, HDL, TOTAL CHOL. AND TRIG.)
Chol HDL Ratio: 5.1 (calc) — ABNORMAL HIGH (ref <5.0)
Cholesterol, HDL: 34 mg/dL — ABNORMAL LOW (ref >=40)
Cholesterol, Total: 172 mg/dL (ref <200)
LDL Cholesterol: 113 mg/dL — ABNORMAL HIGH
LDl/HDL Ratio: 3.3 (calc)
Non HDL Cholesterol: 138 mg/dL — ABNORMAL HIGH (ref <130)
Triglycerides, serum: 137 mg/dL (ref <150)

## 2023-05-03 LAB — TACROLIMUS, HIGHLY SENSITIVE, LC/MS/MS: Tacrolimus: 5.9 ug/L

## 2023-05-03 LAB — MAGNESIUM, SERUM / PLASMA: Magnesium, Serum / Plasma: 2 mg/dL (ref 1.5–2.5)

## 2023-06-27 NOTE — Telephone Encounter (Addendum)
Patient left voicemail asking about sleeve gastrectomy. He wanted to lose weight and asking for safe route for transplant pt?    Sent to Dr Michaelle Birks      Sandi Carne, MD  Modesto Charon, RN  Sleeve gastrectomy is an option and can be done in transplant patients.  But prior to surgery will advise to talk to PCP about use of ozempic or wegovy injections for weight loss instead.Side effects include nausea, vomiting in upto 40% patients, less frequent less than 2% chance of pancreatitis and gall bladder inflammation    ===================  Patient currently have a flu and admitted. RN advised, You may request that they contact the [facility] inpatient kidney team at [phone] for guidance on your kidney transplant-related care. If the providers caring for you wishes to initiate a transfer based on your condition, they will contact our transfer center to request transfer or during the call to our physicians.    Patient verbalized understanding

## 2023-08-08 NOTE — Addendum Note (Signed)
 Addended by: Sandi Carne on: 08/08/2023 04:27 PM     Modules accepted: Orders

## 2023-08-08 NOTE — Telephone Encounter (Signed)
 Sandi Carne, MD  [place] Charon, RN  Caller: Unspecified (Today,  8:48 AM)  Ok to refer to [facility] bariatric surgery

## 2023-08-08 NOTE — Addendum Note (Signed)
 Addended by: Deitra Mayo A on: 08/08/2023 03:15 PM     Modules accepted: Orders

## 2023-08-08 NOTE — Telephone Encounter (Signed)
!-------------------------------------------------------------------      This Message Is From an External Sender    This message came from outside your organization.  -------------------------------------------------------------------!    LogEvent: [id number]  Call #: [phone]  Time Sent: 08/07/2023 18:48 MT    Caller Name: Graham Hyun  Tel: [phone] Ext:   From: Post kidney pt     Message Type: General Message  Message: Pt. Name: Cody Myers  DOB: July 24, 1975  Organ (Liver, Kidney, and/or Pancreas): Kidney Pre or Post. Post Are you a [facility] or [facility] Pt.? [place] Date of transplant (if applicable): 05/21/2019  RE: Following up with Dr. Michaelle Birks regarding abd sleeve surgery, the doctor he was referrred to wants him to have the surgery done in [facility].   Callback number:[phone]    For Organization: [place] (Non Business Hours)  Name:   Role:       [employer]    LogEvent: [id number]

## 2023-09-11 MED ORDER — NEOMYCIN-POLYMYXIN-DEXAMETH 3.5 MG/ML-10,000 UNIT/ML-0.1% EYE DROPS
OPHTHALMIC | Status: AC
Start: 2023-09-11 — End: ?

## 2023-09-11 MED ORDER — TRELEGY ELLIPTA 100 MCG-62.5 MCG-25 MCG POWDER FOR INHALATION
100-62.5-25 | Freq: Every morning | RESPIRATORY_TRACT | Status: AC
Start: 2023-09-11 — End: ?

## 2023-09-11 MED ORDER — PROMETHAZINE-DM 6.25 MG-15 MG/5 ML ORAL SYRUP
6.25-15 | Freq: Every day | ORAL | Status: AC
Start: 2023-09-11 — End: ?

## 2023-09-13 ENCOUNTER — Telehealth: Discharge: 2023-11-29 | Attending: Physician | Primary: Physician

## 2023-09-13 DIAGNOSIS — Z01818 Encounter for other preprocedural examination: Secondary | ICD-10-CM

## 2023-09-13 NOTE — H&P (Signed)
 HPI  Thank-you for referring Cody Myers to the [facility] at the [place] [facility] [place], [place].  As you know, he is a 48 year old with severe obesity and obesity-related comorbidities who presents for consideration of bariatric surgery.  This is the patient's first visit to the [facility].    Today's weight is 302   .  His BMI (body mass index) today is calculated at 41, based on his disclosed height of: Marland Kitchen  His goal weight is 200 pounds.     He has tried multiple conservative measures to achieve weight loss, including dietary, pharmacologic, or exercise programs. Although he has been able to lose some weight from diets and exercise, over time, the weight has always returned, and he has remained morbidly obese.  He has reviewed the [facility] new bariatric patient orientation videos in order to understand the risks and the benefits of bariatric surgery. He now feels committed to proceeding with an operation to help with the weight loss and presents today for further consideration of bariatric surgery.    His obesity related comorbidities include hypertension and osteoarthritis.  Previous abdominal surgeries include none.  He has had a previous kidney transplant which is working well.    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 Guilford Shi.           Patient Active Problem List   Diagnosis    FSGS (focal segmental glomerulosclerosis)    HTN (hypertension)    ESRD (end stage renal disease) (CMS code)    Legally blind    Chronic kidney disease-mineral and bone disorder    Deceased-donor kidney transplant recipient    Immunosuppressive management encounter following kidney transplant    Transplant follow-up    Ureteral stent retained of kidney transplant    At risk for opportunistic infections    At risk of infection transmitted from donor    Delayed graft function of kidney    AV fistula occlusion    Leucopenia    COVID-19    Status post deceased-donor kidney  transplantation    Status post kidney transplant    PTE (post-transplant erythrocytosis)    OA (osteoarthritis) of knee    Eye swelling, bilateral    Hypokalemia     Past Medical History:   Diagnosis Date    Asthma     Inhalers    Back pain     Blood transfusion without reported diagnosis     BMI 35.0-35.9,adult     Height 6 foot even - weight 260 pounds for BMI 36.      Chronic kidney disease 2009    ESRD due FSGS / DM.    Need to clarify FSGS by biopsy.  Noted in evaluation with [facility] but no further documentation of FSGS by Nephrology.     Depression     not taking medication    GERD (gastroesophageal reflux disease)     Gout     Heart murmur     Hypercholesterolemia     Hypertension     2018 BP dropping on dialysis - MD ordered Midodrine 5 mg for SBD <100    Legally blind 2010    Conjunctiva disorder with prolapse right eye.  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    MRSA (methicillin resistant Staphylococcus aureus)     Sleep apnea     Thromboembolism (CMS code)     Dialysis graft only  Past Surgical History:   Procedure Laterality Date    AV FISTULA PLACEMENT Right 2016    BACK SURGERY      Bilateral Eye Surgery      CENTRAL VENOUS CATHETER  2015    Place and removed once fistula working    COLONOSCOPY  03/2018    Negative for polyps - no specimens taken    DIALYSIS FISTULA CREATION Left 2010    Revision 2014    IR DIALYSIS FISTULAGRAM (ORDERABLE BY IR SERVICE ONLY)  05/22/2019    IR DIALYSIS FISTULAGRAM (ORDERABLE BY IR SERVICE ONLY) 05/22/2019 Jenita Seashore, MD RAD IR [facility]    TOOTH EXTRACTION  2018    Wound Vac  2015    Dehiscence anterior torso - chest      Allergies/Contraindications   Allergen Reactions    Aspirin Other (See Comments)     Bleeding ulcers; Has stomach bleeding       Current Outpatient Medications   Medication Sig Dispense Refill    acetaminophen (TYLENOL) 500 mg tablet Take 2 tablets (1,000 mg total) by mouth every 8 (eight) hours as needed for Pain (mild pain) Max = 3000 mg/day 50  tablet 0    albuterol 90 mcg/actuation metered dose inhaler Inhale 1-2 puffs into the lungs every 6 (six) hours as needed for Wheezing or Shortness of Breath         allopurinol (ZYLOPRIM) 300 mg tablet Take 1 tablet (300 mg total) by mouth daily      amLODIPine (NORVASC) 10 mg tablet Take 1 tablet (10 mg total) by mouth daily For additional refills please contact your family doctor. 90 tablet 0    atorvastatin (LIPITOR) 10 mg tablet Take 1 tablet (10 mg total) by mouth daily      cloNIDine HCL (CATAPRES) 0.1 mg tablet Take 1 tablet (0.1 mg total) by mouth daily      docusate sodium (COLACE) 250 mg capsule Take 1 capsule (250 mg total) by mouth Twice a day Hold for loose stools 100 capsule 0    ergocalciferol, vitamin D2, (VITAMIN D2) 50,000 unit capsule Take 1 capsule (50,000 Units total) by mouth every 7 (seven) days Use as instructed      furosemide (LASIX) 20 mg tablet Take 20 mg by mouth daily      loratadine (CLARITIN REDITABS) 10 mg rapid dissolve tablet Dissolve 1 tablet (10 mg total) in the mouth daily      losartan (COZAAR) 25 mg tablet Take 2 tablets (50 mg total) by mouth daily Additional refills please contact Dr. Lonzo Candy 180 tablet 0    magnesium oxide (MAG-OX) 400 mg tablet Take 1 tablet (400 mg total) by mouth daily      multivitamin tablet Take 1 tablet by mouth daily      mycophenolate (CELLCEPT) 250 mg capsule Take 2 capsules (500 mg total) by mouth in the morning and 2 capsules (500 mg total) in the evening. 120 capsule 11    omeprazole (PRILOSEC) 20 mg capsule Take 20 mg by mouth in the morning and at bedtime      oxyCODONE (ROXICODONE) 5 mg tablet Take 1 tablet (5 mg total) by mouth every 6 (six) hours as needed (severe pain) (Patient taking differently: Take 10 mg by mouth every 6 (six) hours as needed (severe pain)) 8 tablet 0    oxyCODONE-acetaminophen (PERCOCET) 10-325 mg tablet Take 1 tablet by mouth every 8 (eight) hours as needed for Pain (Lower back pain)  potassium  chloride (KDUR; KLOR-CON) 20 mEq tablet Take 2 pills (40 mEq) daily for 2 weeks, "or as directed" 28 tablet 0    predniSONE (DELTASONE) 5 mg tablet Take 1 tablet (5 mg total) by mouth daily . 30 tablet 11    senna (SENOKOT) 8.6 mg tablet Take 2 tablets (17.2 mg total) by mouth nightly as needed for Constipation 100 tablet 0    tacrolimus (PROGRAF) 1 mg capsule Take 1 capsule (1 mg total) by mouth 2 (two) times daily (Take with 5 mg capsule to total 6 mg in the AM and PM) 60 capsule 11    tacrolimus (PROGRAF) 5 mg capsule Take 1 capsule (5 mg total) by mouth 2 (two) times daily (Take with 1 mg capsule to total 6 mg in the AM and PM) 60 capsule 11     Current Facility-Administered Medications   Medication Dose Route Frequency Provider Last Rate Last Admin    0.9% sodium chloride bladder irrigation solution  0-3,000 mL Irrigation Once Gerrianne Scale, MD        lidocaine (XYLOCAINE URO-JET) 2 % jelly 10 mL  10 mL Intra-urethral Once Gerrianne Scale, MD        nitrofurantoin (macrocrystal-monohydrate) (MACROBID) 100 mg capsule 100 mg  100 mg Oral Once [facility] Donne Hazel, MD         Social History     Socioeconomic History    Marital status: Single     Spouse name: Not on file    Number of children: 4    Years of education: 12    Highest education level: Associate degree: occupational, Scientist, product/process development, or vocational program   Occupational History    Occupation: Disabled Naval architect   Tobacco Use    Smoking status: Former     Current packs/day: 0.00     Average packs/day: 1 pack/day for 4.0 years (4.0 ttl pk-yrs)     Types: Cigarettes     Start date: 06/19/2003     Quit date: 06/19/2007     Years since quitting: 16.2    Smokeless tobacco: Never   Substance and Sexual Activity    Alcohol use: Not Currently    Drug use: Not Currently    Sexual activity: Not Currently   Other Topics Concern    Not on file   Social History Narrative    Not on file     Social Drivers of Health     Financial Resource Strain: Not on file    Food Insecurity: Not on file   Transportation Needs: Not on file   Housing Stability: Not on file     Family History   Problem Relation Name Age of Onset    Diabetes Mother      Heart disease Mother      Hypertension Mother      Diabetes Father      Heart disease Father      Hypertension Father         This is a shared visit for services provided by me, Randalyn Rhea, MD.  I performed a face-to-face encounter with the patient and the following portion of the note is my own:     Physical Exam  There were no vitals filed for this visit.  There is no height or weight on file to calculate BMI.  Constitutional:       Appearance: Patient is well-developed. Patient is not diaphoretic.   Pulmonary:      Effort: Pulmonary effort is  normal. No respiratory distress.   Abdominal:      General: He is obese   Skin:     Coloration: Skin is not pale.   Neurological:      Mental Status:He  is alert and oriented to person, place, and time.   Psychiatric:         Behavior: Behavior normal.         Thought Content: Thought content normal.         Judgment: Judgment normal.     [facility] Activity Survey Index       No data to display              Assessment and Plan:  In summary, Greg Cratty is a 48 year old male with morbid obesity and associated comorbidities.  He has a BMI of .  He presents for consideration of bariatric surgery.  He does satisfy the criteria set forth for bariatric surgery by the [employer] [facility].    Prior to scheduling surgery, the following tests will be needed. This can be done at [facility], or in conjunction with the patient's PCP.  __ ECG to document preoperative baseline, and completion of the [facility] Activity Survey Index questionnaire in clinic.  (If patient exercise tolerance is <4 METs, patient will require a cardiac stress test).  __ Labs: CBC, CMP, lipid panel, iron panel, vitamin D 25OH, PT/INR, PTT, hemoglobin A1c, TSH, PTH.   __ H. pylori screening antibody. If positive, infection can be  confirmed with urea breath test or stool antigen and the infection should be treated.  __ Evaluation by a mental health care practitioner (psychiatrist, psychologist, LSW, or therapist).  __ Evaluation by a dietitian.  __ Follow up in the [facility] with our bariatric PA.  __ Evaluation by a Primary Care Provider and letter of recommendation. The letter should document your dieting history and that all health maintenance screening, particularly cancer screenings, are up-to-date.    I have provided the patient with the list of these required tests and consultations.    We discussed benefits and risks of gastric bypass surgery.  We explained a complication rate of 12%, and a mortality rate of 0.2%.  Specific complications were explained in detail, including leakage from the gastrojejunostomy or jejunojeunostomy, pulmonary embolism, pneumonia, deep vein thrombosis, renal failure, stroke, heart attack, hernia, wound infection, bleeding, nausea, marginal ulceration, bowel obstruction, persistent obesity, weight regain, and dumping syndrome. We also explained our current understanding of how gastric bypass works: the small gastric pouch restricts oral intake. Additionally, gastrointestinal hormonal secretion is altered, which in turn increases satiety.  We also explained that the excluded stomach cannot be visualized by regular endoscopy after the gastric bypass, nor is the biliary tree accessible by endoscopy. Also, we explained the possibility for conversion to an open procedure because of extensive adhesions, bleeding, or inadequate exposure.  We quoted a likelihood for conversion to be less than 2%.    We discussed sleeve gastrectomy as another weight-loss option.  Specific complications of sleeve gastrectomy include nausea or vomiting, leakage (<1%), bleeding (<2%), heartburn (30-40%), and weight-regain.  The mortality rate is 0.1%.    We explained the eventual need for plastic surgery for excessive skins folds  after weight loss. Additionally, we spoke about the need for consistent long-term postoperative follow-up with scheduled clinic visits and laboratory tests, changes in lifestyle, diet, and eating habits and the need for supplemental calcium and vitamins was explained.    The patient understands  that surgery is only a tool to help with weight loss, and that he will need to maintain a carefully organized and attentive nutritional program with the help of a dietician both before and after surgery.  He understands these plans, the technical aspects of surgery and risks involved, again including both those associated with anesthesia and surgery.  The patient was given the opportunity to ask questions, and all questions were answered.    After our thorough discussion today, he is primarily interested in the robotic assisted laparoscopic sleeve gastrectomy  procedure, and this discussion will be reaffirmed at our next clinic visit.    Documentation of Visit Statement  I spent a total of 45 minutes in face-to-face time with the patient and in non-face-to-face activities conducted today, directly related to this video visit, including reviewing records and tests, obtaining history and exam, placing orders, communicating with other healthcare professionals, counseling the patient, family, or caregiver, documenting in the medical record, and/or care coordination for the diagnoses above.    I performed this consultation using real-time Telehealth tools, including a live video connection between my location and the patient's location. Prior to initiating the consultation, I obtained informed verbal consent to perform this consultation using Telehealth tools and answered all the questions about the Telehealth interaction.  I performed this visit outside a [place] faciliity.  APP Visit Information:   APP Service Type:  Shared

## 2023-09-13 NOTE — Addendum Note (Signed)
 Addended by: Johnita Palleschi I on: 11/28/2023 08:51 PM     Modules accepted: Orders

## 2023-09-13 NOTE — Patient Instructions (Signed)
Bariatric Surgery: New Patient Information     Welcome to the [facility]!  Your surgeon has asked that you complete this list of tests and studies before you can be a candidate for bariatric surgery.  You do not need to repeat any of these tests if they have already been done in the last 1-2 years.  Just send [place] a copy of the final report for [place] to review.    __ ECG to document preoperative baseline, and completion of the Duke Activity Survey Index questionnaire in clinic.  (If patient exercise tolerance is <4 METs, patient will require a cardiac stress test).  __ Labs: CBC, CMP, lipid panel, iron panel, vitamin D 25OH, PT/INR, PTT, hemoglobin A1c, TSH, PTH.   __ H. pylori screening antibody. If positive, infection can be confirmed with urea breath test or stool antigen and the infection should be treated.  __ Evaluation by a mental health care practitioner (psychiatrist, psychologist, LSW, or therapist).  __ Evaluation by a dietitian.  __ Follow up in the [facility] with our bariatric PA.  __ Evaluation by a Primary Care Provider and letter of recommendation. The letter should document your dieting history and that all health maintenance screening, particularly cancer screenings, are up-to-date.    In addition, your insurance company may have additional requirements.  You should contact your insurance company now, to ensure that they cover bariatric surgery for you, and what their requirements are.    What should I do after my pre-operative items are complete?  Contact our clinic by phone or through MyChart.  If you completed your pre-operative items outside of [place], please let us know so we can help determine if copies of the results need to be faxed our mailed to [place].    [facility]  Surgery Faculty Practice  [address] Second floor [place], [place] [address]  tel: [phone], option 5  fax: [phone]  web: [email or web address]    Once the  results have been reviewed by our team, we will schedule a pre-operative video visit appointment with your surgeon. It is at this appointment that a surgery date will be scheduled as well.       Bariatric Surgery 101: The Basics  Bariatric surgery is intended for people who have a Body Mass Index (BMI) of 40 or greater and who have not had success with other weight loss therapies such as diet, exercise, and medications. A person with a BMI of 35 or greater, with one or more obesity related co-morbidity, may also be considered for bariatric surgery.     Bariatric surgery has a history of helping patients effectively transform their health. Bariatric surgery restricts the amount of food patients can eat and makes them feel satiated much earlier in the digestive process. As a tool, bariatric surgery has impressive long-term weight loss results and, in many cases, has resolved or improved associated diseases like diabetes, high blood pressure, sleep apnea, painful joints, and stress urinary incontinence.      Bariatric surgery can give you a chance to change your life, provided you are ready to work hard and make a renewed and lasting commitment to a healthy lifestyle!    There are some important things to know about bariatric surgery:    Bariatric surgery is not cosmetic surgery.  Bariatric surgery does not involve the removal of adipose tissue (fat) by suction or surgical removal.  You must understand the benefits and risks.  You must  commit to long-term lifestyle changes, including diet and exercise, which are key to the success of bariatric surgery.  Complications after surgery may require further operations.    What are the Bariatric Procedures Offered at [facility]?    Gastric Bypass Surgery  The Roux-en-Y Gastric Bypass - often called gastric bypass - is considered the gold standard of weight loss surgery.    The Procedure  There are two components to the procedure. First, a small stomach pouch, approximately one  ounce or 30 milliliters in volume, is created by dividing the top of the stomach from the rest of the stomach. Next, the first portion of the small intestine is divided, and the bottom end of the divided small intestine is brought up and connected to the newly created small stomach pouch. The procedure is completed by connecting the top portion of the divided small intestine to the small intestine further down so that the stomach acids and digestive enzymes from the bypassed stomach and first portion of small intestine will eventually mix with the food.    The gastric bypass works by several mechanisms. First, similar to most bariatric procedures, the newly created stomach pouch is considerably smaller and facilitates significantly smaller meals, which translates into less calories consumed. Additionally, because there is less digestion of food by the smaller stomach pouch, and there is a segment of small intestine that would normally absorb calories as well as nutrients that no longer has food going through it, there is probably to some degree less absorption of calories and nutrients.    Most importantly, the rerouting of the food stream produces changes in gut hormones that promote satiety, suppress hunger, and reverse one of the primary mechanisms by which obesity induces type 2 diabetes.    Advantages  Produces significant long-term weight loss (60 to 80 percent excess weight loss)  Restricts the amount of food that can be consumed  May lead to conditions that increase energy expenditure  Produces favorable changes in gut hormones that reduce appetite and enhance satiety  Typical maintenance of >50% excess weight loss    Disadvantages  From a surgical perspective, it is a more technically complex operation when compared to the sleeve.  The complication rates, while very low, are about double those of the sleeve.    With the malabsorption component, one can develop micronutrient deficiencies, particularly  deficits in vitamin B12, iron, calcium, and folate.  These can be prevented with a daily multivitamin.  Requires adherence to dietary recommendations, life-long vitamin/mineral supplementation, and follow-up compliance    "The Sleeve"  The Sleeve Gastrectomy - often called "the sleeve" - is performed by removing approximately 80 percent of the stomach. The remaining stomach is a tubular pouch that resembles a banana.    The Procedure  This procedure works by several mechanisms. First, the new stomach pouch holds a considerably smaller volume than the normal stomach and helps to significantly reduce the amount of food (and thus calories) that can be consumed. The greater impact, however, seems to be the effect the surgery has on gut hormones that impact a number of factors including hunger, satiety, and blood sugar control.    Short term studies show that the Sleeve is as effective as the Roux-en-Y gastric bypass in terms of weight loss.     Advantages  Restricts the amount of food the stomach can hold  Induces rapid and significant weight loss similar to Roux-en-Y gastric bypass for the first few years.  We are now  starting to gain more long-term research data about this procedure.    Requires no re-routing of the food stream (Gastric Bypass)  Involves a relatively short hospital stay.  Typically, 1 night or less.    Causes favorable changes in gut hormones that suppress hunger, reduce appetite and improve satiety    Disadvantages  Is a non-reversible procedure  Has the potential for long-term vitamin deficiencies, although less likely when compared to the gastric bypass.  The long-term results not as well understood given the sleeve is a newer procedure.    About a 30% chance of new or worsening heartburn symptoms after surgery.  Symptoms can typically be controlled with acid reducing medication; however, uncontrolled symptoms may require an additional surgery.     Lap Band Removal   The Adjustable Gastric Band -  often called the band - involves an inflatable band that is placed around the upper portion of the stomach, creating a small stomach pouch above the band, and the rest of the stomach below the band.    This operation has fallen out of favor in the [place] and across most of the world due to high failure and complication rates.  While we no longer place lap bands, we can remove the band and convert to another bariatric surgery, such as the sleeve.  While each patient is unique, the conversion can possibly be completed in one surgery or may need to be staged as two separate surgeries.       How can I decide what procedure is right for me?  Your surgeon will evaluate your overall medical condition and typically recommend one procedure over another.  But the final decision is yours - you need to choose a procedure you are comfortable with, after you have researched the risks, benefits, and differences between them.    To help inform your decision, we offer the following resources:  EMMI educational videos: These videos provided easily digestible information to help you understand each surgery.  If you do not receive a link for this, please contact our office.   Our New Patient Orientation video on our website: [email or web address]  The [facility] website: [email or web address]  [facility] Support Group This offered through our Center for pre-operative and post-operative patients, as well as for family members and other interested persons. The meetings are virtual and held on Zoom at 5pm every 3rd Wednesday of the month.  The meeting schedule and lecture topics are available on the website above. Email [place] at: [email or web address]. Registration is not necessary to attend the support group.     Why should I establish care with a nutritionist or dietician?  The best long-term success after weight-loss surgery occurs in patients who regularly see a nutritionist and change their  eating habits for good.    The purpose of the pre-operative evaluation is to establish this relationship and improve your understanding of essential concepts in nutrition, such as food composition, caloric content, and calorie density. A basic knowledge of the proportion of carbohydrate, protein, and fat found in different foods, as well as training in portion control, is essential for your long-term success. Your nutritionist will also assess your readiness to change dietary habits in order to maintain a low calorie, volume-restricted diet for life.    Why is a mental health evaluation needed?  Weight-loss surgery requires a lifelong commitment to nutritional supplementation and regular visits to your bariatric surgeon and primary care provider.  Some patients cannot make this commitment because of  mental illness, depression, substance abuse, financial stress, or other life-stressors.    The purpose of your mental health evaluation is to assess whether you are mentally and emotionally prepared for the surgery, understand that the surgery is not reversible, and also understand the risks and alternatives. It should specifically address the following issues:  Screening for any of these psychiatric conditions: psychosis, major depression, obsessive-compulsive disorder, major eating disorder (such as bulimia), alcohol abuse, or substance abuse.  Establish your competency in making the decision to proceed with weight loss surgery.   Establish that you fully comprehend the seriousness of the procedure and the lifestyle and behavioral changes that must take place after it is done;   Establish that you have realistic expectations about the outcome of surgery and can handle the stress of the period following surgery.    If I smoke, can I still have weight loss surgery?  If you smoke, it is very important that you stop smoking at least 6 weeks prior to your surgery.  This will help to improve healing and decrease the  incidence of post-operative complications.    What do I do if my insurance coverage changes?  If you have an insurance change, please contact our office and provided [place] with the updated information.  Candidacy for this procedure depends upon adequate insurance coverage and the ability to obtain the required medications after discharge. About four to six weeks before your scheduled surgery, we will request verification of coverage from your insurance plan.    What if I have questions?  We will be happy to answer any questions you may have while you are awaiting your procedure.  Should you have any additional questions, please contact us at [phone], option [phone].    Preparing For Bariatric Surgery  To help make the surgical process as straightforward as possible, we have created a patient education guide called What to Expect: Bariatric Surgery, which you should receive after your pre-operative appointment.  See below for some highlights:    After you meet your surgeon for the pre-operative appointment, their practice coordinator will give you specific instructions about the date and time of the operation. Information that will be reviewed at the visit:    You will go on a preoperative liquid diet for 2 weeks prior to surgery.  You can choose to buy premade shakes, such as Premiere Protein Shakes, or purchase a protein powder and mix yourself.   You will drink three shakes a day.  Other liquids such as broth, coffee, popsicles, and jello are ok during this time, but no solid food.  The liquid diet helps to kickstart weight loss and shrink the size of the liver, which makes the operation easier for the surgeon to perform.  You will need to stop taking any nonsteroidal anti-inflammatory drugs (NSAIDs) one week before your scheduled operation date. Plan to never take these medicines ever again because you will now be at an increased risk of gastro-intestinal ulcers. Examples of nonsteroidal anti-inflammatory drugs  (NSAIDs) are: Naprosyn, Ibuprofen, Aleve, Aspirin, Celebrex, Advil/Motrin.  Your Anesthesia Prepare provider will give you detailed instructions regarding which of your medications to take or hold before surgery.  On the day before your operation you should have nothing to eat or drink after midnight. You will also take a mild bowel prep, Magnesium Citrate, around 10am the morning before surgery.  This is an over the counter medication.      Your Stay in the Hospital    What  to bring  Please bring a photo I.D. with you to the hospital. We recommend that you leave all valuables at home. If you have sleep apnea and use a CPAP machine at night, you should bring your machine with you. You may wish to bring a light robe and slippers, a toothbrush and any other toiletries that will make you more comfortable.    The morning of the operation  Be sure not to eat or drink anything.  You will report at the designated time to the Surgery Waiting Area on the main entry floor of [facility], [address]. You should check in with the receptionist and then be seated. When told by the operating room staff, a [facility] employee will escort you to the 4th Floor to a gurney in the pre-op area. You will then get into a hospital gown and begin the preparations for the operation.    After the operation, also called Post-Op Day 0  You will spend one or two hours in the post-anesthesia care unit, then be transferred to a room on the surgical floor. If you have severe sleep apnea, asthma, or a lung condition, you might need to spend a short period in the Intensive Care Unit.   Expect to be out of bed and walking. Your nurse will tell you when you are safe to walk by yourself. The first time you walk will be with your nurse.  Let your nurse know if you have pain that prevents you from moving. Oral medications will be used for pain management and works best when used regularly. Ask for another dose before the pain gets too bad, so  that we can avoid chasing your pain.  It is common to feel sick to your stomach after surgery now that your stomach is so small. A good strategy to reduce this feeling is to take small, frequent sips of fluids. This will help to reduce the risk of dehydration as well. We can also give you medication, if needed, to reduce this feeling.    Post-Op Day 1  Expect to be tolerating small sips of oral fluids and walking often to reduce the risk of blood clots.  You will be started on an acid reduction medication to reduce your risk of developing ulcers in your digestive tract.  The dietitian will review the bariatric diet recommendations with you. This is a great time to have your diet specific questions answered.  Plan to be discharged from the hospital the day after surgery.    Post-Op Day 2  You will likely be home at this point; however, some patients may need to stay another night at the hospital.  At this point you should spend most of the day out of bed and walking. Have a family member help. You will continue a liquid diet as well as oral pain medication.    Prepare for Discharge  You will be ready for discharge is you are meeting post-surgery goals: Walking on your own, tolerating liquids, and pain is well controlled  If you have a drain in place, this will be removed before you are discharged.  You will receive all medications needed at home through the [facility] to [facility]  You will be seen for post-operative follow-up through video visit with the bariatric clinic 1-3 weeks after surgery.      Post Operation Instructions For Your Care at Home  It is very common to experience nausea and loose stools in the first month.  These symptoms will go away with time.    The 24-hour contact number for the Surgery Faculty Practice is [phone].  Please call this number with any questions you might have. Call this number as soon as possible if any of the following symptoms occur:    1. Fever of greater than  101.18F  2. Nausea or vomiting (especially if you are unable to keep liquids down)  3. Severe pain at the incision  4. Increasing redness or foul-smelling drainage from the incision  5. Uncontrolled bleeding  6. Persistent diarrhea or more than 10 bowel movements in 24 hours  7. You are not able to urinate after 8 hours  8. If you experience dizziness, lightheadedness, or extreme fatigue    Medications & Pain Control  When you leave the hospital, you will be given prescriptions for several medications, some of which you will only take for a few months and others will be lifelong. These medications must be taken as prescribed and should be started within the first week of arriving home. You will be provided detailed instructions regarding the adjustment of chronic medications and addition of new medications.  Please contact our clinic with any questions.      You will be prescribed a medicine to help with pain control. Over time you will need less and less of the medicine, and you should gradually reduce the amount you take in the weeks after surgery. While Tylenol is ok, do not take any form of non-steroidal anti-inflammatory drugs (NSAIDS) such as the ones mentioned above!     Immediately following surgery and for at least the following 6 months, you will be prescribed a medicine that decreases acid production by your stomach in order to decrease the risk of ulcers.  This medication is a proton pump inhibitor (PPI), such as omeprazole (Prilosec) capsules 20mg  daily. Your surgeon may recommend that you continue using an acid reduction medication for the rest of your life.    If you still have a gallbladder, you will be prescribed a medicine called Ursodiol to help prevent the formation of gallstones. The dose is 300mg  twice a day.  This should be taken for 6 months after surgery.    Vitamins and Minerals are Required Daily  Procare bariatric multivitamin once daily  Celebrate calcium + vitamin D chewable     While  you will be able to pick up your first month supply from the [facility] at [facility], no prescription is needed.  We recommend ordering both online for subsequent refills as that is the most affordable options.  Go to Procarenow.com or Amazon.com for the multivitamin.  Go to celebratevitamins.com for the calcium chews.      Activity  It is common to feel a little more tired than usual in the first couple of weeks after surgery. This is to be expected, and you should listen to your body to determine the appropriate level of activity.    You may return to full activity, including work, when you feel ready.  After a minimally invasive surgery this usually occurs within two weeks.  If you had minimally invasive surgery, you may want to wait 4-6 weeks before resume rigorous activity.   You may perform normal activities, such as walking up and down stairs, walking outside the house, doing chores about the house and riding in a car when you feel up to it.  You may drive after surgery as long as long as you are not taking any opioid  pain medication.    Bowel Movements  You will likely have irregular bowel movements after bariatric surgery, but they will normalize over time.  If you have not had a bowel movement 4 days past your surgery, we recommend taking a dose of Milk of Magnesia. This will help for acute constipation by speeding up bowel motility.  To prevent constipation, we recommend adding powder fiber (Benefiber or Metamucil) with increased fluid intake, stool softner (Colace), smooth move tea, or small doses of prune juice.     Wound Care  You may take showers after your operation. Let the soap and water run over your incisions. If you had a gastric bypass, expect some drainage where the drain was removed.  This is normal and will resolve after a few days.    Follow-up Contacts  Your first postoperative appointment should be scheduled within 1-3 weeks of leaving the hospital. If you don't have an appointment scheduled,  MyChart your surgeon's office.    It is recommended that you meet regularly with a dietitian and join a support group in your community to assist in your lifestyle adjustments.     Post-Operative Follow-up Appointments  You will be seen by a member of our bariatric team via video visit.  If you wish to have a follow-up appointment with the dietitian, please let us know so the visit can be scheduled accordingly:  1-3 weeks after discharge  3 months post-op  6 months post-op  12 months post-op  every year thereafter    Monitoring lab values is important to avoid complications of malnutrition. Labs should start with the 3 month visit, and be drawn about 2 weeks before your appointment so the results can be discussed with your surgeon.  If you are getting your labs drawn at your local hospital, bring the printed results with you to your appointment.  These labs are recommended:  Complete blood count  Complete metabolic panel  Lipid Panel  Iron panel  Folate, thiamine, copper, iron, ferritin, selenium, zinc, vitamin A, D, B12  Hemoglobin A1C  PTH     Approach to Diet After Your Operation  The diet guidelines are designed to limit calories while providing a balanced meal plan to help prevent nutrient deficiencies and preserve muscle tissue. Tolerance and progression will vary greatly from patient to patient.    General Guidelines  Eat slowly and chew small bites of food thoroughly.  When you first start taking solids, avoid rice, bread, raw vegetables, and meats that are not easily chewed such as pork and steak. Ground meats are usually better tolerated.  Eat balanced meals with small portions.  Keep a daily record of your food portions, plus calorie and protein intake.  Concentrate on following a diet low in calories and sweets.  Avoid sugar, sugar-containing foods and beverages, concentrated sweets, and fruit juices.    Fluids  Drink extra water and low calorie or calorie-free fluids between meals to avoid dehydration.  Sip about one cup of fluid between each small meal, 6 to 8 times a day. At least two liters (64 ounces or 8 cups) of fluid a day is recommended. You will gradually be able to meet this target.    We strongly warn against the use of any alcoholic beverages. Alcohol will get absorbed into your system much more quickly than before, and the sedative and mood- altering affects are more difficult to predict and control.    Protein  Preserve muscle tissue by eating foods rich in  protein. High protein foods are eggs, meats, fish, seafood, tuna, poultry, soy milk, tofu, cottage cheese, yogurt, and other milk products. Your goal should be a minimum of 60-80 grams of protein per day. Do not worry if you cannot reach this goal in the first couple months after your operation.     Diet Progression After Bariatric Surgery  Please reference our "Diet after Bariatric Surgery" education guide for detailed directions regarding diet advancement after surgery.

## 2023-09-13 NOTE — Addendum Note (Signed)
 Addended by: Earlie Glen on: 11/20/2023 09:27 PM     Modules accepted: Orders

## 2023-09-26 NOTE — Progress Notes (Unsigned)
 Received from Nephrology Dept.

## 2023-09-26 NOTE — Telephone Encounter (Addendum)
 Received from Nephrology Dept        -------------------------UPDATE/ADDENDUM---------------------------    RN action: RTC to pt, who states he had heartburn, acid reflux yesterday and run out of his Rx. Asking for an advise. Pt is 4y43mo s/p kidney txp.   RN recs pt to contact local PCP ASAP to address above symptoms/concerns and get Rx refills as appropriate.  Pt verb understanding and agreement.    -----------------------------------------------------------------------------------

## 2023-10-17 ENCOUNTER — Ambulatory Visit: Admit: 2023-10-17 | Payer: MEDICARE | Attending: Nephrology | Primary: Physician

## 2023-10-17 DIAGNOSIS — Z940 Kidney transplant status: Secondary | ICD-10-CM

## 2023-10-17 MED ORDER — PREDNISONE 5 MG TABLET
5 | ORAL_TABLET | Freq: Every day | ORAL | 11 refills | 30.00000 days | Status: AC
Start: 2023-10-17 — End: ?

## 2023-10-17 MED ORDER — MYCOPHENOLATE MOFETIL 250 MG CAPSULE
250 | ORAL_CAPSULE | Freq: Two times a day (BID) | ORAL | 11 refills | 30.00000 days | Status: AC
Start: 2023-10-17 — End: ?

## 2023-10-17 MED ORDER — TACROLIMUS 1 MG CAPSULE, IMMEDIATE-RELEASE
1 | ORAL_CAPSULE | Freq: Two times a day (BID) | ORAL | 11 refills | 30.00000 days | Status: AC
Start: 2023-10-17 — End: ?

## 2023-10-17 MED ORDER — TACROLIMUS 5 MG CAPSULE, IMMEDIATE-RELEASE
5 | ORAL_CAPSULE | Freq: Two times a day (BID) | ORAL | 11 refills | 30.00000 days | Status: AC
Start: 2023-10-17 — End: ?

## 2023-10-17 NOTE — Progress Notes (Signed)
 POST-KIDNEY TRANSPLANT NEPHROLOGY PROGRESS NOTE         Subjective      Chief Complaint   Patient presents with    Kidney Transplant Follow-up       History of Present Illness:  Cody Myers is a 48 y.o. year old male S/P kidney transplant on 05/20/2019, being seen for a follow-up visit.   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 Acute tubular epithelia injury; see comment.   2. No evidence of rejection, negative C4d stain.   2. No significant tubulointerstitial chronicity.   3. Moderate arteriosclerosis, donor-derived.      Special Stain Summary: (x8) PAS and trichrome special stains were  performed and examined to confirm the diagnosis. The findings are  consistent with our H&E-based light microscopic impression. Six  additional PAS-stained level sections were evaluated, and no segmentally  sclerotic glomeruli are identified.     DSAs not done      Immunosuppressive management encounter following kidney transplant 05/21/2019      Induction agent/dose: Thymo lite  Regime: Tac/MMF  Corticosteroid plan: maintenance   cPRA: 10%    Results of allograft biopsy on 12/24/2019  showed no rejection, DSA neg.      Transplant follow-up 05/21/2019     Category Problem Type of follow-up needed in clinic? Ordered? Scheduled? Comments   Nursing (e.g., drain/ Foley) HD tunneled line refer to IR for removal when appropriate Needs to be done in clinic weekly dressing changes in HD clinic until can be removed   Lab/ Micro Standard Tac Follow up results   Needs to be ordered None   Imaging studies/ procedures NA NA NA None   Referrals Uroglogy for stent removal NA Already ordered None   Delayed graft function and improving UOP, monitor for resolution        Ureteral stent retained of kidney transplant 05/21/2019     Implant Name Type Inv. Item Serial No. Manufacturer Lot No. LRB No. Used Action   STENT URETHRAL 6FR X 16CM DOUBLE J   [id number] - [id number] Stents-Non Des STENT URETHRAL 6FR X 16CM DOUBLE J   [id number]   [id number] Right 1 Implanted     Referral placed for outpatient stent removal    07/01/19 stent removed.       At risk for opportunistic infections 05/21/2019     Transplant Serology Workup:  Recipient  Lab Results   Component Value Date     HBCAB NEG 12/17/2018     HBSAG NEG 12/17/2018     HBABQ >800 12/17/2018     HCV NEG 12/17/2018     RPR Nonreactive 12/17/2018     CMVAB POS (A) 12/17/2018     VCAM NEG 12/17/2018     EBNA POS (A) 12/17/2018     TOXO NEG 12/17/2018            Donor        Cadaver Donor UNOS ID (no units)   Date Value   05/20/2019 [id number]      Match ID: [id number]            Induction Plan:   Thymo + steroid maintenance      Plan for PPX:   Toxo prophylaxis: (D +/R - Toxo MISMATCH), Septra DS 1 tab PO Daily x 1 month (06/19/2019), then qMWF x 11 months more (05/19/2020) -- 1 year total for Toxo Mismatch   PCP prophylaxis: Septra DS as per above for Toxo ppx   CMV prophylaxis: (D +/R +), Valcyte 900 mg PO Daily (adjusted for renal function)  x 3 months (08/18/2019)   Fungal prophylaxis: Fluconazole 100 mg PO q7days x 1 month (06/18/2019)  HBV prophylaxis: Not necessary            At risk of infection transmitted from donor 05/21/2019     Patient underwent transplant with no known donor derived risk factors: Donor is not CDC high risk, not HBV core ab + and there are no known donor derived infections. No need for treatment or monitoring beyond the usual opportunistic infection prophylaxis per protocol. Donor with no known malignancies.  The donor's CMV status is IgG positive and the recipient's CMV status is IgG positive.  Prophylaxis will be determined by risk of CMV transmission.                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     Dialysis Type: HD Hemodialysis  Dialysis Center: [facility] / [facility]         Phone: [phone]       Fax: [phone]       Address: 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  Been on HD since 2010.  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         Current Outpatient Medications on File Prior to Visit   Medication Sig Dispense Refill    acetaminophen (TYLENOL) 500 mg tablet Take 2 tablets (1,000 mg total) by mouth every 8 (eight) hours as needed for Pain (mild pain) Max = 3000 mg/day (Patient not taking: Reported on 09/11/2023) 50 tablet 0    albuterol 90 mcg/actuation metered dose inhaler Inhale 1-2 puffs into the lungs every 6 (six) hours as needed for Wheezing or  Shortness of Breath       allopurinol (ZYLOPRIM) 300 mg tablet Take 1 tablet (300 mg total) by mouth daily      amLODIPine (NORVASC) 10 mg tablet Take 1 tablet (10 mg total) by mouth daily For additional refills please contact your family doctor. 90 tablet 0    atorvastatin (LIPITOR) 10 mg tablet Take 1 tablet (10 mg total) by mouth daily      cloNIDine HCL (CATAPRES) 0.1 mg tablet Take 1 tablet (0.1 mg total) by mouth daily      docusate sodium (COLACE) 250 mg capsule Take 1 capsule (250 mg total) by mouth Twice a day Hold for loose stools (Patient not taking: Reported on 09/11/2023) 100 capsule 0    ergocalciferol, vitamin D2, (VITAMIN D2) 50,000 unit capsule Take 1 capsule (50,000 Units total) by mouth every 7 (seven) days Use as instructed      furosemide (LASIX) 20 mg tablet Take 1 tablet (20 mg total) by mouth daily      loratadine (CLARITIN REDITABS) 10 mg rapid dissolve tablet Dissolve 1 tablet (10 mg total) in the mouth daily      losartan (COZAAR) 25 mg tablet Take 2 tablets (50 mg total) by mouth daily Additional refills please contact Dr. Antonio Villalvazo 180 tablet 0    magnesium oxide (MAG-OX) 400 mg tablet Take 1 tablet (400 mg total) by mouth daily      multivitamin tablet Take 1 tablet by mouth daily      mycophenolate (CELLCEPT) 250 mg capsule Take 2 capsules (500 mg total) by mouth in the morning and 2 capsules (500 mg total) in the evening. 120 capsule 11    neomycin-polymyxin-dexamethasone (MAXITROL) 3.5mg /mL-10,000 unit/mL-0.1 % ophthalmic suspension Place into both eyes      omeprazole (PRILOSEC) 20 mg capsule Take 1 capsule (20 mg total) by mouth in the morning and at bedtime      oxyCODONE (ROXICODONE) 5 mg tablet Take 1 tablet (5 mg total) by mouth every 6 (six) hours as needed (severe pain) 8 tablet 0    oxyCODONE-acetaminophen (PERCOCET) 10-325 mg tablet Take 1 tablet by mouth every 8 (eight) hours as needed for Pain (Lower back pain)    (Patient not taking: Reported on 09/11/2023)      potassium chloride (KDUR;  KLOR-CON) 20 mEq tablet Take 2 pills (40 mEq) daily for 2 weeks, "or as directed" 28 tablet 0    predniSONE (DELTASONE) 5 mg tablet Take 1 tablet (5 mg total) by mouth daily . 30 tablet 11    promethazine-dextromethorphan 6.25-15 mg/5 mL syrup Take 5 mL by mouth daily      senna (SENOKOT) 8.6 mg tablet Take 2 tablets (17.2 mg total) by mouth nightly as needed for Constipation (Patient not taking: Reported on 09/11/2023) 100 tablet 0    tacrolimus (PROGRAF) 1 mg capsule Take 1 capsule (1 mg total) by mouth 2 (two) times daily (Take with 5 mg capsule to total 6 mg in the AM and PM) 60 capsule 11    tacrolimus (PROGRAF) 5 mg capsule Take 1 capsule (5 mg total) by mouth 2 (two) times daily (Take with 1 mg capsule to total 6 mg in the AM and PM) 60 capsule 11    TRELEGY ELLIPTA 100-62.5-25 mcg inhaler Inhale 1 puff into the lungs every morning       Current Facility-Administered Medications on File Prior to Visit   Medication Dose Route Frequency Provider Last Rate Last Admin    0.9%  sodium chloride bladder irrigation solution  0-3,000 mL Irrigation Once Christi Marie Butler, MD        lidocaine (XYLOCAINE URO-JET) 2 % jelly 10 mL  10 mL Intra-urethral Once Mirian Ames, MD        nitrofurantoin (macrocrystal-monohydrate) (MACROBID) 100 mg capsule 100 mg  100 mg Oral Once Mirian Ames, MD                 Objective          Physical Exam:   General: Well-appearing. No acute distress.  HEENT: Normocephalic and atraumatic.  Chest: Unlabored respirations.   Skin: No obvious lesions or rash   Psychiatric: Normal mood and affect.   Neurological: Alert and oriented x 3. Tremor absent..       Review of Prior Testing  I personally reviewed data  as noted below including the following:    Metabolic panel  Complete blood count  Level of immunosuppressive drug (tacrolimus).  Urine chemistry including level of proteinuria          Lab Results   Component Value Date    Creatinine 1.51 (H) 12/24/2019    Creatinine 2.12  (H) 06/15/2019    Creatinine 2.52 (H) 06/09/2019    Creatinine, Serum / Plasma 1.59 (H) 05/01/2023    Creatinine, Serum / Plasma 1.45 (H) 07/16/2022    Creatinine, Serum / Plasma 1.56 (H) 06/13/2022    Urea Nitrogen, serum/plasma 22 05/01/2023    Urea Nitrogen, serum/plasma 16 07/16/2022    Urea Nitrogen, serum/plasma 24 06/13/2022    Sodium, Serum / Plasma 138 05/01/2023    Sodium, Serum / Plasma 137 07/16/2022    Sodium, Serum / Plasma 135 06/13/2022    Potassium, Serum / Plasma 3.4 (L) 05/01/2023    Potassium, Serum / Plasma 3.9 07/16/2022    Potassium, Serum / Plasma 4.7 06/13/2022    Carbon Dioxide, Total 23 05/01/2023    Carbon Dioxide, Total 23 07/16/2022    Carbon Dioxide, Total 19 (L) 06/13/2022    Calcium, total, Serum / Plasma 10.2 05/01/2023    Calcium, total, Serum / Plasma 9.8 07/16/2022    Calcium, total, Serum / Plasma 10.0 06/13/2022    Phosphorus, serum/plasma 2.7 05/01/2023    Phosphorus, serum/plasma 3.3 07/16/2022    Phosphorus, serum/plasma 3.6 06/13/2022     Lab Results   Component Value Date    WBC Count 5.8 05/01/2023    WBC Count 7.4 07/16/2022    WBC Count 4.7 06/13/2022    Hemoglobin 16.7 05/01/2023    Hemoglobin 15.4 07/16/2022    Hemoglobin 16.5 06/13/2022    Hematocrit 51.7 (H) 05/01/2023    Hematocrit 47.9 07/16/2022    Hematocrit 50.5 (H) 06/13/2022    MCV 83.0 05/01/2023    MCV 82.3 07/16/2022    MCV 81.7 06/13/2022    Platelet Count 275 05/01/2023    Platelet Count 280 07/16/2022    Platelet Count 319 06/13/2022       Lab Results   Component Value Date    Tacrolimus 5.9 06/15/2019    Tacrolimus 5.2 06/09/2019    Tacrolimus 5.0 06/04/2019          Assessment and Plan           Status post deceased-donor kidney transplantation  S/p DDRT 05/20/2019; 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  Adherence to lab  schedule stressed.  These labs were drawn at time of ER visit for influenza and AKI(  serum creatinine 2.2 mg.dl) in 10/4096  AKI resolved at time of discharge        Immunosuppressive management encounter following kidney transplant  cPRA 10%.  Continue Tacrolimus 6 mg in am and 6 mg in pm with goal trough 5-7 ng/ml.Repeat tacrolimus trough level pending   Continue MMF 500 mg twice a day.  Continue prednisone 5 mg daily.     HTN (hypertension)  Controlled on current regimen.  He is off losartan since AKI in 06/2023  If repeat labs show stable function then ok to restart losartan        PTE (post-transplant erythrocytosis)   Hgb 15.6 g/dl in 06/1912  CT showed no renal lesions.  Will need phlebotomy for Hct > 55%  He follows with local hematologist for follow up and need for phlebotomy     Hypokalemia  K 3.3   High potassium food advised  He is also taking potassium supplementation    Influenza A   Influenza infection in 06/2023   He received tamiflu      Obesity:  He was evaluated by Dr Kyla Phlegm   He is considering his options including sleeve gastrectomy     Visit Diagnoses and Associated Orders Placed:  1. Status post deceased-donor kidney transplantation  Complete Blood Count with Differential    Comprehensive Metabolic Panel (BMP, AST, ALT, T.BILI, ALKP, TP, ALB)    Protein, Total, random urine    Creatinine, random urine    Albumin (Microalbumin), spot urine    BK virus, DNA, Quantitative, plasma    Tacrolimus, Highly Sensitive, LC/MS/MS [facility] (External Lab)    Complete Blood Count with Differential    Comprehensive Metabolic Panel (BMP, AST, ALT, T.BILI, ALKP, TP, ALB)    Protein, Total, random urine    Creatinine, random urine    Albumin (Microalbumin), spot urine    BK virus, DNA, Quantitative, plasma    Tacrolimus, Highly Sensitive, LC/MS/MS [facility] (External Lab)    tacrolimus (PROGRAF) 5 mg capsule    tacrolimus (PROGRAF) 1 mg capsule    mycophenolate (CELLCEPT) 250 mg capsule    predniSONE (DELTASONE) 5 mg tablet      2. Hyperglycemia  Hemoglobin A1c    Hemoglobin A1c      3. Immunosuppressive  management encounter following kidney transplant  tacrolimus (PROGRAF) 5 mg capsule    tacrolimus (PROGRAF) 1 mg capsule    mycophenolate (CELLCEPT) 250 mg capsule    predniSONE (DELTASONE) 5 mg tablet      4. Hypertension, unspecified type        5. PTE (post-transplant erythrocytosis)        6. Hypokalemia        7. Influenza A        8. Obesity, unspecified class, unspecified obesity type, unspecified whether serious comorbidity present        9. Status post kidney transplant        10. Deceased-donor kidney transplant recipient             Nutritional assessment: Patient appears well developed and well-nourished. No need for a dietary consult at this time   Pharmacy assessment: Current medications reviewed and discussed with patient. No need for a pharmacy consult at this time.    Continue to monitor kidney function, CBC, chem-7, drug levels this week then every 2-3 months.  Monitor liver function, lipid panel, hemoglobin A1C, intact PTH,  urine pr/cr ratio every 3-6 months.       Health Care Maintenance:  Recommend usual age- and sex-appropriate cancer screening plus annual dermatology visit for skin cancer surveillance  Recommend age and disease-appropriate vaccinations (killed vaccines only) including annual influenza vaccine. Live vaccines are contraindicated.     Risk Assessment: The patient is at increased risk of complications including drug toxicity, infection and malignancy as a result of immunosuppression for his kidney transplant and requires close monitoring of immunosuppression levels and dose adjustment to manage toxicities.   There is no evidence of immunosuppressive drug toxicity at this visit.                I spent a total of 45 minutes on this patient's care on the day of their visit excluding time spent related to any billed procedures. This time includes time spent with the patient as well as time spent documenting in the medical record, reviewing patient's records and tests, obtaining  history, placing orders, communicating with other healthcare professionals, counseling the patient, family, or caregiver, and/or care coordination for the diagnoses above.      I performed this evaluation using real-time telehealth tools, including a live video Zoom connection between my location and the patient's location. Prior to initiating, the patient consented to perform this evaluation using telehealth tools.

## 2023-11-10 NOTE — Telephone Encounter (Signed)
 Hgba1c added to SLO per Dr. Stephania Eglin.

## 2024-01-02 LAB — COMPLETE BLOOD COUNT WITH DIFFERENTIAL
% Basophils: 0.5 %
% Eosinophils: 1.2 %
% Lymphocytes: 23.1 %
% Monocytes: 12.4 %
% Neutrophils: 62.8 %
Abs Basophils (cells/uL): 28 {cells}/uL (ref 0–200)
Abs Eosinophils (cells/uL): 67 {cells}/uL (ref 15–500)
Abs Lymphocytes: 1294 {cells}/uL (ref 850–3900)
Abs Monocytes (cells/uL): 694 {cells}/uL (ref 200–950)
Abs Neutrophils (cells/uL): 3517 {cells}/uL (ref 1500–7800)
Hematocrit: 54.3 % — ABNORMAL HIGH (ref 38.5–50.0)
Hemoglobin: 17.3 g/dL — ABNORMAL HIGH (ref 13.2–17.1)
MCH: 26.7 pg — ABNORMAL LOW (ref 27.0–33.0)
MCHC: 31.9 g/dL — ABNORMAL LOW (ref 32.0–36.0)
MCV: 83.7 fL (ref 80.0–100.0)
MPV: 11.3 fL (ref 7.5–12.5)
Platelet Count: 271 Thousand/uL (ref 140–400)
RBC Count: 6.49 Million/uL — ABNORMAL HIGH (ref 4.20–5.80)
RDW-CV: 14.9 % (ref 11.0–15.0)
WBC Count: 5.6 Thousand/uL (ref 3.8–10.8)

## 2024-01-02 LAB — BASIC METABOLIC PANEL
BUN/Creatinine Ratio: 14 (calc) (ref 6–22)
Calcium, total, Serum / Plasma: 10 mg/dL (ref 8.6–10.3)
Carbon Dioxide, Total: 23 mmol/L (ref 20–32)
Chloride, Serum / Plasma: 104 mmol/L (ref 98–110)
Creatinine, Serum / Plasma: 1.54 mg/dL — ABNORMAL HIGH (ref 0.60–1.29)
Glucose: 95 mg/dL (ref 65–99)
Potassium, Serum / Plasma: 4.4 mmol/L (ref 3.5–5.3)
Sodium, Serum / Plasma: 137 mmol/L (ref 135–146)
Urea Nitrogen, serum/plasma: 22 mg/dL (ref 7–25)
eGFRcr: 55 mL/min/1.73m2 — ABNORMAL LOW (ref >=60)

## 2024-01-02 LAB — PHOSPHORUS, SERUM / PLASMA: Phosphorus, serum/plasma: 3.9 mg/dL (ref 2.5–4.5)

## 2024-01-02 LAB — TACROLIMUS, HIGHLY SENSITIVE, LC/MS/MS: Tacrolimus: 14.2 ug/L

## 2024-01-02 LAB — MAGNESIUM, SERUM / PLASMA: Magnesium, Serum / Plasma: 2.2 mg/dL (ref 1.5–2.5)

## 2024-01-03 NOTE — Telephone Encounter (Addendum)
 RESULTS: 12/31/23 09:06 Tacrolimus : 14.2- 9-10 hr trough   12/31/23 09:06 Hemoglobin: 17.3 (H)    ============================  Denies tac toxicity symptoms  Denies new meds     Patient will repeat labs on Tuesday, 7/22 w/ accurate 12 hour trough   Reinforced education on lab and med timings

## 2024-01-10 LAB — TACROLIMUS, HIGHLY SENSITIVE, LC/MS/MS: Tacrolimus: 5.4 ug/L

## 2024-03-16 NOTE — Research Notes (Signed)
 Dear Cody Myers,     We are writing to inform you of an important update regarding the [facility] blood draw research study in which you are enrolled. The Principal Investigator (PI) for this study has changed. The PI has transitioned from Dr. Leslye to Dr. Larissa Allan. Please note this change in study leadership.     Going forward, all questions, concerns, or requests for information related to this study should be directed to Dr. Allan, who will now serve as the Principal Investigator and assume overall responsibility for the conduct of the study.     We sincerely appreciate your continued participation in this research.     Warm regards,   Divya [facility]   Clinical Research Coordinator

## 2024-04-09 ENCOUNTER — Ambulatory Visit: Admit: 2024-04-09 | Payer: MEDICARE | Primary: Physician

## 2024-04-09 ENCOUNTER — Ambulatory Visit: Admit: 2024-04-09 | Payer: MEDICARE | Attending: Nephrology | Primary: Physician

## 2024-04-09 DIAGNOSIS — D84821 Immunodeficiency due to drugs: Secondary | ICD-10-CM

## 2024-04-09 DIAGNOSIS — Z79899 Other long term (current) drug therapy: Secondary | ICD-10-CM

## 2024-04-09 LAB — CBC W/AUTO DIFF (LAB ONLY)
Basophils Absolute: 0.03 10*9/L (ref 0.00–0.10)
Eosinophils Absolute: 0.07 10*9/L (ref 0.00–0.40)
Hematocrit: 50.8 % (ref 41.0–53.0)
Hemoglobin: 16.1 g/dL (ref 13.6–17.5)
Immature Granulocytes Absolute: 0.02 10*9/L (ref <0.10)
Lymphocytes Absolute: 1.06 10*9/L (ref 1.00–3.40)
MCH: 26.5 pg (ref 26.0–34.0)
MCHC: 31.7 g/dL (ref 31.0–36.0)
MCV: 84 fL (ref 80–100)
Mean Platelet Volume (MPV): 10.4 fL (ref 9.1–12.6)
Monocytes Absolute: 0.54 10*9/L (ref 0.20–0.80)
Neutrophils Absolute: 3.39 10*9/L (ref 1.80–6.80)
Nucleated RBCs: 0 % (ref <=0.0)
Platelet Count: 244 10*9/L (ref 140–450)
Preliminary ANC: 3.39 10*9/L (ref 1.80–6.80)
RBC: 6.07 10*12/L — ABNORMAL HIGH (ref 4.40–5.90)
RDWCV: 14.1 % (ref 11.7–14.4)
WBC: 5.1 10*9/L (ref 3.4–10.0)

## 2024-04-09 LAB — LIPID PANEL
Cholesterol, HDL, Plasma/Serum: 27 mg/dL — ABNORMAL LOW (ref >39)
Cholesterol, Total, Plasma/Serum: 156 mg/dL (ref <200)
Cholesterol/HDL Ratio: 5.8 (ref <6.0)
LDL Cholesterol: 106 mg/dL (ref <130)
Non HDL Cholesterol: 129 mg/dL (ref <160)
Triglycerides, Plasma / Serum: 116 mg/dL (ref <200)

## 2024-04-09 LAB — COMPREHENSIVE METABOLIC PANEL
Alanine Transaminase, Plasma / Serum: 16 U/L (ref 10–61)
Albumin, Plasma / Serum: 3.7 g/dL (ref 3.5–5.0)
Alkaline Phosphatase, Plasma / Serum: 67 U/L (ref 38–108)
Anion Gap: 9 mmol/L (ref 4–14)
Aspartate Transaminase, Plasma / Serum: 18 U/L (ref 5–44)
Bilirubin, Total, Plasma/Serum: 1.7 mg/dL — ABNORMAL HIGH (ref 0.2–1.2)
Calcium, Total, Plasma / Serum: 9.5 mg/dL (ref 8.4–10.5)
Carbon Dioxide, Total, Plasma / Serum: 21 mmol/L — ABNORMAL LOW (ref 22–29)
Chloride, Plasma / Serum: 107 mmol/L (ref 101–110)
Creatinine, Plasma / Serum: 1.59 mg/dL — ABNORMAL HIGH (ref 0.73–1.24)
Glucose, Non-Fasting, Plasma/Serum: 134 mg/dL (ref 70–199)
Potassium, Plasma/Serum: 3.4 mmol/L — ABNORMAL LOW (ref 3.5–5.0)
Protein, Total, Plasma / Serum: 6.9 g/dL (ref 6.3–8.6)
Sodium, Plasma/Serum: 137 mmol/L (ref 135–145)
Urea Nitrogen, Serum/Plasma: 27 mg/dL — ABNORMAL HIGH (ref 7–25)
eGFR: 53 mL/min/1.73 sq m — ABNORMAL LOW (ref >59)

## 2024-04-09 LAB — TACROLIMUS LEVEL: Tacrolimus: 21.1 ng/mL — ABNORMAL HIGH (ref 5.0–15.0)

## 2024-04-09 LAB — PROTEIN TOTAL, URINE, RANDOM
Protein Concentration, Urine: 30 mg/dL
Protein/Creatinine Ratio, Urine: 89 mg/g{creat} (ref <150)

## 2024-04-09 LAB — CREATININE, URINE, RANDOM: Creatinine, Random Urine: 338.88 mg/dL

## 2024-04-09 LAB — PHOSPHORUS, SERUM / PLASMA: Phosphorus, Plasma / Serum: 3.3 mg/dL (ref 2.3–4.7)

## 2024-04-09 LAB — HEMOGLOBIN A1C: Hemoglobin A1c: 6.2 % — ABNORMAL HIGH (ref 4.3–5.6)

## 2024-04-09 LAB — VITAMIN D, 25-HYDROXY: Vitamin D, 25-Hydroxy: 27 ng/mL (ref 20–50)

## 2024-04-09 LAB — PARATHYROID HORMONE, INTACT: PTH: 95 ng/L — ABNORMAL HIGH (ref 18–90)

## 2024-04-09 LAB — MAGNESIUM, SERUM / PLASMA: Magnesium, Plasma / Serum: 1.9 mg/dL (ref 1.6–2.6)

## 2024-04-09 MED ORDER — LOSARTAN 25 MG TABLET
25 | ORAL_TABLET | Freq: Every day | ORAL | 3 refills | 30.00000 days | Status: AC
Start: 2024-04-09 — End: 2025-04-04

## 2024-04-09 NOTE — Telephone Encounter (Signed)
 Spoke with patients son Leanord to inform him that [facility] lab orders have been renewed.   Silvio, [place]

## 2024-04-09 NOTE — Progress Notes (Signed)
 POST-KIDNEY TRANSPLANT NEPHROLOGY PROGRESS NOTE         Subjective      Chief Complaint   Patient presents with    Kidney Transplant Follow-up       History of Present Illness:  Cody Myers is a 48 y.o. year old male S/P kidney transplant on 05/20/2019, being seen for a follow-up visit. The patient feels well and has no new complaints.         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 Acute tubular epithelia injury; see comment.   2. No evidence of rejection, negative C4d stain.   2. No significant tubulointerstitial chronicity.   3. Moderate arteriosclerosis, donor-derived.      Special Stain Summary: (x8) PAS and trichrome special stains were  performed and examined to confirm the diagnosis. The findings are  consistent with our H&E-based light microscopic impression. Six  additional PAS-stained level sections were evaluated, and no segmentally  sclerotic glomeruli are identified.     DSAs not done      Immunosuppressive  management encounter following kidney transplant 05/21/2019     Induction agent/dose: Thymo lite  Regime: Tac/MMF  Corticosteroid plan: maintenance   cPRA: 10%    Results of allograft biopsy on 12/24/2019  showed no rejection, DSA neg.      Transplant follow-up 05/21/2019     Category Problem Type of follow-up needed in clinic? Ordered? Scheduled? Comments   Nursing (e.g., drain/ Foley) HD tunneled line refer to IR for removal when appropriate Needs to be done in clinic weekly dressing changes in HD clinic until can be removed   Lab/ Micro Standard Tac Follow up results   Needs to be ordered None   Imaging studies/ procedures NA NA NA None   Referrals Uroglogy for stent removal NA Already ordered None   Delayed graft function and improving UOP, monitor for resolution        Ureteral stent retained of kidney transplant 05/21/2019     Implant Name Type Inv. Item Serial No. Manufacturer Lot No. LRB No. Used Action   STENT URETHRAL 6FR X 16CM DOUBLE J   [id number] - [id number] Stents-Non Des STENT URETHRAL 6FR X 16CM DOUBLE J   [id number]   [id number] Right 1 Implanted     Referral placed for outpatient stent removal    07/01/19 stent removed.       At  risk for opportunistic infections 05/21/2019     Transplant Serology Workup:  Recipient        Lab Results   Component Value Date     HBCAB NEG 12/17/2018     HBSAG NEG 12/17/2018     HBABQ >800 12/17/2018     HCV NEG 12/17/2018     RPR Nonreactive 12/17/2018     CMVAB POS (A) 12/17/2018     VCAM NEG 12/17/2018     EBNA POS (A) 12/17/2018     TOXO NEG 12/17/2018            Donor        Cadaver Donor UNOS ID (no units)   Date Value   05/20/2019 [id number]      Match ID: [id number]            Induction Plan:   Thymo + steroid maintenance      Plan for PPX:   Toxo prophylaxis: (D +/R - Toxo MISMATCH), Septra  DS 1 tab PO Daily x 1 month (06/19/2019), then qMWF x 11 months more (05/19/2020) -- 1 year total for Toxo Mismatch   PCP prophylaxis: Septra  DS as per above for Toxo ppx   CMV prophylaxis:  (D +/R +), Valcyte  900 mg PO Daily (adjusted for renal function) x 3 months (08/18/2019)   Fungal prophylaxis: Fluconazole  100 mg PO q7days x 1 month (06/18/2019)  HBV prophylaxis: Not necessary            At risk of infection transmitted from donor 05/21/2019     Patient underwent transplant with no known donor derived risk factors: Donor is not CDC high risk, not HBV core ab + and there are no known donor derived infections. No need for treatment or monitoring beyond the usual opportunistic infection prophylaxis per protocol. Donor with no known malignancies.  The donor's CMV status is IgG positive and the recipient's CMV status is IgG positive.  Prophylaxis will be determined by risk of CMV transmission.                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     Dialysis Type: HD Hemodialysis  Dialysis Center: [facility]         Phone: [phone]       Fax: [phone]       Address: [phone] 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  Been on HD since 2010.  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 No acute distress.legally  blind   HEENT: Mucous membranes moist.   Chest: Good inspiratory effort. Lungs clear to auscultation.   Heart:: Regular rhythm. Murmur absent. Pedal edema absent.  Abdomen: Nontender/nondistended.   Lymphatic: No cervical or axillary adenopathy.   Skin: No obvious rashes.  Psychiatric: Normal mood and affect.   Neurological: Alert and oriented x 3. Tremor absent.   Genitourinary: Allograft is not tender.       Review of Prior Testing  I personally reviewed data as noted below including the following:    Metabolic panel  Complete blood count  Urine chemistry including level of proteinuria  Level of immunosuppressive drug (tacrolimus ).          Most recent relevant labs   Component Value Date    Creatinine 1.51 (H) 12/24/2019    Creatinine 2.12 (H) 06/15/2019    Creatinine 2.52 (H) 06/09/2019    Creatinine, Serum / Plasma 1.54 (H) 12/31/2023    Creatinine, Serum / Plasma 1.59 (H) 05/01/2023    Creatinine, Serum / Plasma 1.45 (H) 07/16/2022    Urea Nitrogen, serum/plasma 22 12/31/2023    Urea Nitrogen, serum/plasma 22 05/01/2023    Urea Nitrogen, serum/plasma 16 07/16/2022    Sodium, Serum / Plasma 137 12/31/2023    Sodium, Serum / Plasma 138 05/01/2023    Sodium, Serum / Plasma 137 07/16/2022    Potassium, Serum / Plasma 4.4 12/31/2023    Potassium, Serum / Plasma 3.4 (L) 05/01/2023    Potassium, Serum / Plasma 3.9 07/16/2022    Carbon Dioxide, Total 23 12/31/2023    Carbon Dioxide, Total 23 05/01/2023    Carbon Dioxide, Total 23 07/16/2022    Calcium, total, Serum / Plasma 10.0 12/31/2023    Calcium, total, Serum / Plasma 10.2 05/01/2023    Calcium, total, Serum / Plasma 9.8 07/16/2022    Phosphorus, serum/plasma 3.9 12/31/2023    Phosphorus, serum/plasma 2.7 05/01/2023    Phosphorus, serum/plasma 3.3 07/16/2022     Most recent relevant labs   Component Value Date    WBC Count 5.6 12/31/2023    WBC Count 5.8 05/01/2023    WBC Count 7.4 07/16/2022    WBC 5.1 04/09/2024    Hemoglobin 16.1 04/09/2024    Hemoglobin 17.3  (H) 12/31/2023    Hemoglobin 16.7 05/01/2023    Hemoglobin 15.4 07/16/2022    Hematocrit 50.8 04/09/2024    Hematocrit 54.3 (H) 12/31/2023    Hematocrit 51.7 (H) 05/01/2023    Hematocrit 47.9 07/16/2022    MCV 84 04/09/2024    MCV 83.7 12/31/2023    MCV 83.0 05/01/2023    MCV 82.3 07/16/2022    Platelet Count 244 04/09/2024    Platelet Count 271 12/31/2023    Platelet Count 275 05/01/2023    Platelet Count 280 07/16/2022       Most recent relevant labs   Component Value Date    Tacrolimus  5.9 06/15/2019    Tacrolimus  5.2 06/09/2019    Tacrolimus  5.0 06/04/2019          Assessment and Plan           Status post deceased-donor kidney transplantation  S/p DDRT 05/20/2019; 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  Continue Tacrolimus  6 mg in am and 6 mg in pm with goal trough 5-7 ng/ml.  He will repeat tacrolimus  trough level, level on 10/23 was not a trough level, took tacrolimus  in the morning before labs  Continue MMF 500 mg twice a day.  Continue prednisone  5 mg daily.     HTN (hypertension)  Controlled     PTE (post-transplant erythrocytosis)   Hgb  16.1 g/dl as of 89/76/7974  CT showed no renal lesions.  Will need phlebotomy for Hct > 55%  He follows with local hematologist for follow up and need for phlebotomy  Per pt he also had PET scan done at hem/onc office , will try to get results: oncologist:Dr Sachin Gupta in [place]:     Will restart losartan  25 mg daily     Hypokalemia  K 3.3   High potassium food advised  He is also taking potassium supplementation  Will restart losartan      Obesity:  He is considering his options including sleeve gastrectomy   He wants to be referred again to discuss this further     Visit Diagnoses and Associated Orders Placed:  1. Immunosuppressive management encounter following kidney transplant        2.  Deceased-donor kidney transplant recipient  losartan  (COZAAR ) 25 mg tablet    Basic Metabolic Panel (NA, K, CL, CO2, BUN, CR, GLU, CA)    Tacrolimus , Highly Sensitive, LC/MS/MS [facility] (External Lab)    Basic Metabolic Panel (NA, K, CL, CO2, BUN, CR, GLU, CA)    Tacrolimus , Highly Sensitive, LC/MS/MS [facility] (External Lab)    Tacrolimus , Highly Sensitive, LC/MS/MS [facility] (External Lab)    Tacrolimus , Highly Sensitive, LC/MS/MS [facility] (External Lab)      3. Hypertension, unspecified type        4. PTE (post-transplant erythrocytosis)        5. Hypokalemia        6. Obesity, unspecified class, unspecified obesity type, unspecified whether serious comorbidity present  Referral to General Surgery & Subspecialties - Destination: [facility]; Subspecialty: Bariatric Surgery; Reason: Obesity / Severe obesity / Bariatric surgery         No orders of the defined types were placed in this encounter.      Nutritional assessment: Patient appears well developed and well-nourished. No need for a dietary consult at this time   Pharmacy assessment: Current medications reviewed and discussed with patient. No need for a pharmacy consult at this time.    Continue to monitor kidney function, CBC, chem-7, drug levels in a week then every 2-3 months if stable function and tacrolimus  at goal..   Monitor liver function, lipid panel, hemoglobin A1C, intact PTH, urine pr/cr ratio every 3-6 months.       Health Care Maintenance:  Recommend usual age- and sex-appropriate cancer screening plus annual dermatology visit for skin cancer surveillance  Recommend age and disease-appropriate vaccinations (killed vaccines only) including annual influenza vaccine. Live vaccines are contraindicated.     Risk Assessment: The patient is at increased risk of complications including drug toxicity, infection and malignancy as a result of immunosuppression for his kidney transplant and requires close monitoring of immunosuppression levels and dose adjustment to  manage toxicities.   There is no evidence of immunosuppressive drug toxicity at this visit.  I spent a total of 45 minutes on this patient's care on the day of their visit excluding time spent related to any billed procedures. This time includes time spent with the patient as well as time spent documenting in the medical record, reviewing patient's records and tests, obtaining history, placing orders, communicating with other healthcare professionals, counseling the patient, family, or caregiver, and/or care coordination for the diagnoses above.

## 2024-04-24 ENCOUNTER — Ambulatory Visit: Payer: MEDICARE | Attending: Physician Assistant | Primary: Physician

## 2024-04-24 NOTE — Telephone Encounter (Incomplete)
 Estimated body mass index is 40.45 kg/m as calculated from the following:    Height as of 04/09/24: 180.3 cm (5\' 11" ).    Weight as of 04/09/24: 131.5 kg (290 lb).    Consult Date: 3/28/202025  Surgeon: Dr. Flossie  Hx of kidney transplant    06/29/2023 -  ECG  Sinus rhythm with premature atrial complexes with aberrant conduction   Low voltage QRS   Cannot rule out Anterior infarct (cited on or before 18-Apr-2021)   Abnormal ECG   When compared with ECG of 05-Nov-2021 12:08,   aberrant conduction is now present   Questionable change in QRS axis     06/29/2023 - chest xray   LUNGS:  Lungs are clear without consolidation, edema, nodule, or mass.  No pleural effusion or pneumothorax.   MEDIASTINUM:  The cardiomediastinal silhouette is within normal limits in size and contour.   BONES:  Osseous structures are intact without acute, displaced fracture identified.   UPPER ABDOMEN:  Visualized portions of the upper abdomen are unremarkable.     *** -  Pre Op Evaluation Labs  CMP 04/09/24 K 3.4  Cr 1.59    CBC 04/09/24 wnl   Lipid panel 04/09/24 HDL 27    PT/INR, PTT {wnl/other:38996}   hemoglobin A1C 04/09/24 6.2   TSH, PTH  {wnl/other:38996}   Iron, ferritin  {wnl/other:38996}   Vitamin D  25(OH) 04/09/24 27   H.pylori screening {POSITIVE/NEGATIVE:24010}     05/22/24 -  Evaluation by a mental health care practitioner for clearance for bariatric surgery  Scheduled     *** -  Evaluation by your Primary Care Provider and letter of recommendation/clearance for bariatric surgery  {cleared/scheduled/other:38994}    04/29/24 -  Evaluation by a nutritionist or dietician  Scheduled     Alcohol, drug, and tobacco screening completed: {YES/NO:24105}    Insurance: Medicare which requires 4 month  ***  ***  ***    {MBSAQIP risk calculator}

## 2024-04-24 NOTE — Progress Notes (Signed)
 Called pt to log in for zoom visit.  Today no longer works with his schedule.  Rebooked to Monday.    Lauraine Parents, PA-C  [facility]   [phone]

## 2024-04-27 ENCOUNTER — Ambulatory Visit: Payer: MEDICARE | Attending: Physician Assistant | Primary: Physician

## 2024-04-27 NOTE — Progress Notes (Signed)
 I performed this consultation using real-time Telehealth tools, including a live video connection between my location and the patient's location. Prior to initiating the consultation, I obtained informed verbal consent to perform this consultation using Telehealth tools and answered all the questions about the Telehealth interaction.    HPI  I saw Cody Myers, DOB 1976-04-13, today at the [facility], B[facility]ric Surgery Program: [facility].  He is here for documentation of non surgical weight loss attempts to prepare for bariatric surgery.  He is still contemplating whether he'd like to move forward with surgery.      Currently fasting.  Doesn't eat breakfast or lunch.  Tries not to eat after 6 o'clock.  Breakfast is a smoothie.  Eats pasta and bread often; likes sandwiches.  Drinking sparkling water  and tea.  Doesn't really drink soda.  Drinks coffee with cream and sugar.      He walks for physical activity.  Also goes to the gym with his son.      Hx of PUD.  He was transferred to [facility] on October 13, 2007, for further evaluation and management of blindness and encephalopathy. His hospital course was complicated by upper gastrointestinal bleed due to an H. pylori ulcer necessitating transfusion of 6 units of packed red blood cells, and hospital-acquired pneumonia. Could not find a trial of cure stool test in his records.      He has completed all of his pre-op labs: Most, still needs to do h.pylori, PT/INR, PTT, iron, ferritin ([facility])  He has completed his ECG: Yes.  He has completed his nutrition visit: Scheduled.  He has completed his psych visit: Scheduled.    PE  Vitals: Ht 180.3 cm (5\' 11" )   Wt (!) 131.5 kg (290 lb)   BMI 40.45 kg/m   Constitutional: oriented to person, place, and time.    Assessment & Plan  1. Morbid Obesity   -He will plan on attending our [facility] for remaining non surgical weight loss  documentation.  -He will continue to increase protein intake and decrease carbohydrate and sugar intake.  Discussed the importance of avoiding liquid calories.  -Encouraged him to eat three balanced meals a day rather than fasting.   -He will work to increase daily physical activity.   -"What to Expect" and "Diet After Surgery" education guides emailed to the patient. mychart, mail hard copies.      2. Preop testing  -Will have him do the remaining labs at [facility].      I, Lauraine Ronnald Parents, PA-C, spent a total of 41 minutes on this patient's care on the day of their visit excluding time spent related to any billed procedures. This time includes time spent with the patient as well as time spent documenting in the medical record, reviewing patient's records and tests, obtaining history, placing orders, communicating with other healthcare professionals, counseling the patient, family, or caregiver, and/or care coordination for the diagnoses above.    APP Visit Information:   APP Service Type:  Independent  Available MD consultant:  Prentice Oneil Petit, MD    Lauraine Parents, PA-C  [facility]: Bariatric Surgery         [facility]  [address] [place] [address]  Ph: [phone] (OPT-5)  Direct Line: [phone]  Fax: [phone]

## 2024-04-29 ENCOUNTER — Ambulatory Visit: Payer: MEDICARE | Attending: Registered" | Primary: Physician

## 2024-04-29 NOTE — Progress Notes (Signed)
 This consultation was performed using real-time Telehealth tools, including a live video connection between my location and the patient's location. Prior to initiating the consultation, verbal consent was obtained to perform this consultation using Telehealth tools and answered all the questions about the Telehealth interaction.    Nutrition Evaluation:    DOS: 04/29/24    [facility]    Cody Myers  is a  48 y.o. yo male with morbid obesity for bariatric surgery nutrition evaluation.  Pt. is interested in gastric sleeve surgery procedure.         Nutrition -related medical history: kidney transplant (12/20), OA, COVID, FSGS, HTN      Reason for surgery:  For health - help kidney    Known bariatric patients?  Yes    Successful?              Yes         Anthropometric Data and Assessment:    Physical:    HT   Ht Readings from Last 1 Encounters:   04/27/24 180.3 cm (5\' 11" )       WT:   Wt Readings from Last 3 Encounters:   04/27/24 (!) 131.5 kg (290 lb)   04/09/24 (!) 131.5 kg (290 lb)   09/11/23 (!) 137 kg (302 lb)       IBW:  168% IBW (172# +10%).  High wt: 300s#.  Goal wt: 200#.    Ideal Body Weight: 78.06 kg    Calculation weight BMI: 40.5 kg/m^2 - Class  III  obesity    Weight history:  "always a big boy"  Dieting history  Multiple diet programs, pills w/o permanent success.      Physical Activity:   Physical activity details: weight lifting, walk at work, gym - treadmill, bike, 3-4 miles, 2x/wk        Food and Nutrition Intake History:   Information obtained from: Information Provided By: Patient      Oral Food and Beverage Intake    Fasting - No B & L for past 1 months notes more at PM meal.  B -eggs, bacon; L - yogurt or sandwich; sn - chips, donuts; drinks water , some soda; D - son does cooking: soup/ chicken/ vegetables Asian style often    Other:  Note lady friend is moving in with him soon - "eats a lot of plain yogurt and cottage cheese"  Reports fasting helps not to snack but hungry  at PM meal    Assessment:  Inadequate fiber/plant foods; excess total/refined carbohydrates (sweets, starch, snacks); excess total/saturated fats (dairy, meats, added, snacks); inadequate free fluids; irregular meals      Nutrition Diagnosis:     Excess energy intake related to food/nutrition knowledge deficit as evidenced by diet recall and morbid obesity.      Nutrition Intervention/Goals:  [x]   Evaluation for bariatric surgery completed.  Patient appears to be a good candidate for bariatric surgery d/t comorbidities and history of unsuccessful weight loss attempts.  Patient could possibly lose weight with professional supervision and has proven weight loss with diet/exercise but at a very slow rate.    [x]   Post-bariatric surgery MNT reviewed with written material provided to include progression of diet from liquid to pureed to soft based on surgery type and daily need for vitamin/mineral supplementation with written information provided.  Bariatric plate with visual reviewed to include goals for successful long-term weight loss.  [x]   Plate method of meal planning discussed with written material  provided to include post bariatric plate and post surgery diet progression to assist with weight loss pre-surgery and to begin preparation for post surgery.  Continue nutrition education highly recommended prior to surgery to aid success.       1) Plan regular meals with portion control & balanced nutrients:  Lean protein + vegetables + less starch/fats - 3 meals, minimal snacks       2) Reduce total fat/ carbohydrate - Improve choices, healthier options, use less       3) Limit liquid sugar & alcohol - Use sugar free, dilute, eliminate       4) Increase fiber/ plant foods - non-starchy vegetables highlighted       5) Adequate free fluids intake - 6-8 cups/ day and more       6) Search for bariatric recipes, support  [x]   Importance of regular, consistent exercise reviewed with goal of 60 minutes daily for successful  weight loss.  Begin to establish habit:   20-30 minutes 3-5 times each week and increase to 60 minutes each day and more, as tolerated.  Consider chair or pool exercise.  [x]   Self -monitoring methods discussed to help record intake, exercise, lifestyle habits to aid in increased weight loss success and to review with RD at post-op follow-up appointment, prn.   Consider utilizing nutrition and exercise web sites, applications, note book pre-surgery to aid change.  [x]   Eating Behavior:  Begin addressing reasons behind eating behaviors and seek new coping skill.  Elicit aid of mental health professional and seek other support to aid change.  [x]   Nutrition Education:  Continued nutrition education and MNT for weight management  prior to surgery to aid success - Http://fisher.org/ and other web resources discussed.  [facility], [facility], [employer] [facility] or other community resource.      Nutrition Monitoring & Evaluation:  [x]   Continued nutrition education and MNT for weight management prior to surgery.  [x]   Continue MNT inpatient with hospital RD post surgery.  [x]   Follow up  post surgery with RD at [facility], prn.    [x]   Follow up support at [facility], prn.     Compliance expectancy:  Expected to be fair-good.  Pt. appears motivated to improve health via successful and permanent weight loss but would benefit from support and continued MNT for weight management.      Ronal Leeroy Hefty, RD, CDE   bariatric dietitian

## 2024-04-29 NOTE — Telephone Encounter (Signed)
 Telephoned re:  9:30 AM virtual RD visit.  Pt notes he was dialing in ASAP.  ME Faryal Marxen, RD, CDE

## 2024-05-29 LAB — BASIC METABOLIC PANEL
BUN/Creatinine Ratio: 15 (calc) (ref 6–22)
Calcium, total, Serum / Plasma: 9.7 mg/dL (ref 8.6–10.3)
Carbon Dioxide, Total: 26 mmol/L (ref 20–32)
Chloride, Serum / Plasma: 105 mmol/L (ref 98–110)
Creatinine, Serum / Plasma: 1.49 mg/dL — ABNORMAL HIGH (ref 0.60–1.29)
Glucose: 127 mg/dL — ABNORMAL HIGH (ref 65–99)
Potassium, Serum / Plasma: 3.5 mmol/L (ref 3.5–5.3)
Sodium, Serum / Plasma: 140 mmol/L (ref 135–146)
Urea Nitrogen, serum/plasma: 23 mg/dL (ref 7–25)
eGFRcr: 58 mL/min/1.73m2 — ABNORMAL LOW (ref >=60)

## 2024-05-29 LAB — TACROLIMUS, HIGHLY SENSITIVE, LC/MS/MS: Tacrolimus: 6.5 ug/L

## 2024-05-29 LAB — COMPLETE BLOOD COUNT WITH DIFFERENTIAL
% Basophils: 0.8 %
% Eosinophils: 2.1 %
% Lymphocytes: 28.7 %
% Monocytes: 15.4 %
% Neutrophils: 53 %
Abs Basophils (cells/uL): 30 {cells}/uL (ref 0–200)
Abs Eosinophils (cells/uL): 80 {cells}/uL (ref 15–500)
Abs Lymphocytes: 1091 {cells}/uL (ref 850–3900)
Abs Monocytes (cells/uL): 585 {cells}/uL (ref 200–950)
Abs Neutrophils (cells/uL): 2014 {cells}/uL (ref 1500–7800)
Hematocrit: 51.2 % — ABNORMAL HIGH (ref 39.4–51.1)
Hemoglobin: 16.1 g/dL (ref 13.2–17.1)
MCH: 26.5 pg — ABNORMAL LOW (ref 27.0–33.0)
MCHC: 31.4 g/dL — ABNORMAL LOW (ref 31.6–35.4)
MCV: 84.3 fL (ref 81.4–101.7)
MPV: 11.5 fL (ref 7.5–12.5)
Platelet Count: 229 Thousand/uL (ref 140–400)
RBC Count: 6.07 Million/uL — ABNORMAL HIGH (ref 4.20–5.80)
RDW-CV: 13.4 % (ref 11.0–15.0)
WBC Count: 3.8 Thousand/uL (ref 3.8–10.8)

## 2024-05-29 LAB — PHOSPHORUS, SERUM / PLASMA: Phosphorus, serum/plasma: 3.3 mg/dL (ref 2.5–4.5)

## 2024-05-29 LAB — MAGNESIUM, SERUM / PLASMA: Magnesium, Serum / Plasma: 1.8 mg/dL (ref 1.5–2.5)

## 2024-07-04 LAB — COMPLETE BLOOD COUNT WITH DIFFERENTIAL
% Basophils: 0.7 %
% Eosinophils: 1.2 %
% Lymphocytes: 18.8 %
% Monocytes: 11.8 %
% Neutrophils: 67.5 %
Abs Basophils (cells/uL): 29 {cells}/uL (ref 0–200)
Abs Eosinophils (cells/uL): 50 {cells}/uL (ref 15–500)
Abs Lymphocytes: 790 {cells}/uL — ABNORMAL LOW (ref 850–3900)
Abs Monocytes (cells/uL): 496 {cells}/uL (ref 200–950)
Abs Neutrophils (cells/uL): 2835 {cells}/uL (ref 1500–7800)
Hematocrit: 49.4 % (ref 39.4–51.1)
Hemoglobin: 16.1 g/dL (ref 13.2–17.1)
MCH: 26.9 pg — ABNORMAL LOW (ref 27.0–33.0)
MCHC: 32.6 g/dL (ref 31.6–35.4)
MCV: 82.6 fL (ref 81.4–101.7)
MPV: 11.5 fL (ref 7.5–12.5)
Platelet Count: 268 Thousand/uL (ref 140–400)
RBC Count: 5.98 Million/uL — ABNORMAL HIGH (ref 4.20–5.80)
RDW-CV: 13.6 % (ref 11.0–15.0)
WBC Count: 4.2 Thousand/uL (ref 3.8–10.8)

## 2024-07-04 LAB — BASIC METABOLIC PANEL
Calcium, total, Serum / Plasma: 9.8 mg/dL (ref 8.6–10.3)
Carbon Dioxide, Total: 22 mmol/L (ref 20–32)
Chloride, Serum / Plasma: 104 mmol/L (ref 98–110)
Creatinine, Serum / Plasma: 1.25 mg/dL (ref 0.60–1.29)
Glucose: 111 mg/dL — ABNORMAL HIGH (ref 65–99)
Potassium, Serum / Plasma: 3.9 mmol/L (ref 3.5–5.3)
Sodium, Serum / Plasma: 138 mmol/L (ref 135–146)
Urea Nitrogen, serum/plasma: 18 mg/dL (ref 7–25)
eGFRcr: 71 mL/min/1.73m2 (ref >=60)

## 2024-07-04 LAB — HEMOGLOBIN A1C: Hemoglobin A1c: 6.3 % — ABNORMAL HIGH (ref <5.7)

## 2024-07-04 LAB — MAGNESIUM, SERUM / PLASMA: Magnesium, Serum / Plasma: 1.8 mg/dL (ref 1.5–2.5)

## 2024-07-04 LAB — PHOSPHORUS, SERUM / PLASMA: Phosphorus, serum/plasma: 2.6 mg/dL (ref 2.5–4.5)
